# Patient Record
Sex: Female | Born: 1945
Health system: Southern US, Community
[De-identification: ages and names within clinical notes are randomized; demographics above are authoritative.]

## PROBLEM LIST (undated history)

## (undated) DIAGNOSIS — M549 Dorsalgia, unspecified: Secondary | ICD-10-CM

## (undated) DIAGNOSIS — M2550 Pain in unspecified joint: Secondary | ICD-10-CM

## (undated) DIAGNOSIS — E669 Obesity, unspecified: Secondary | ICD-10-CM

## (undated) DIAGNOSIS — R7301 Impaired fasting glucose: Secondary | ICD-10-CM

## (undated) DIAGNOSIS — E559 Vitamin D deficiency, unspecified: Secondary | ICD-10-CM

## (undated) DIAGNOSIS — R7303 Prediabetes: Secondary | ICD-10-CM

## (undated) DIAGNOSIS — I10 Essential (primary) hypertension: Secondary | ICD-10-CM

## (undated) HISTORY — DX: Obesity, unspecified: E66.9

## (undated) HISTORY — DX: Prediabetes: R73.03

## (undated) HISTORY — DX: Pain in unspecified joint: M25.50

## (undated) HISTORY — PX: OTHER SURGICAL HISTORY: SHX169

## (undated) HISTORY — DX: Dorsalgia, unspecified: M54.9

## (undated) HISTORY — DX: Vitamin D deficiency, unspecified: E55.9

## (undated) HISTORY — DX: Essential (primary) hypertension: I10

## (undated) HISTORY — DX: Impaired fasting glucose: R73.01

## (undated) HISTORY — PX: MM BREAST STEREO BX*L*R/S: HXRAD495

---

## 1995-04-10 HISTORY — PX: TOTAL ABDOMINAL HYSTERECTOMY: SHX209

## 1998-04-09 HISTORY — PX: WRIST FRACTURE SURGERY: SHX121

## 1999-03-09 ENCOUNTER — Ambulatory Visit (HOSPITAL_COMMUNITY): Admission: RE | Admit: 1999-03-09 | Discharge: 1999-03-09 | Payer: Self-pay | Admitting: Gastroenterology

## 2001-10-17 ENCOUNTER — Encounter: Payer: Self-pay | Admitting: Emergency Medicine

## 2001-10-17 ENCOUNTER — Emergency Department (HOSPITAL_COMMUNITY): Admission: EM | Admit: 2001-10-17 | Discharge: 2001-10-17 | Payer: Self-pay | Admitting: Emergency Medicine

## 2004-02-26 ENCOUNTER — Emergency Department (HOSPITAL_COMMUNITY): Admission: EM | Admit: 2004-02-26 | Discharge: 2004-02-26 | Payer: Self-pay | Admitting: *Deleted

## 2004-02-28 ENCOUNTER — Ambulatory Visit: Payer: Self-pay | Admitting: Internal Medicine

## 2004-08-17 ENCOUNTER — Ambulatory Visit (HOSPITAL_COMMUNITY): Admission: RE | Admit: 2004-08-17 | Discharge: 2004-08-17 | Payer: Self-pay | Admitting: Internal Medicine

## 2004-08-22 ENCOUNTER — Ambulatory Visit: Payer: Self-pay | Admitting: Internal Medicine

## 2004-12-04 ENCOUNTER — Ambulatory Visit: Payer: Self-pay | Admitting: Internal Medicine

## 2004-12-18 ENCOUNTER — Ambulatory Visit: Payer: Self-pay | Admitting: Internal Medicine

## 2005-03-19 ENCOUNTER — Ambulatory Visit: Payer: Self-pay | Admitting: Internal Medicine

## 2006-01-02 ENCOUNTER — Ambulatory Visit: Payer: Self-pay | Admitting: Internal Medicine

## 2006-01-23 ENCOUNTER — Ambulatory Visit: Payer: Self-pay | Admitting: Internal Medicine

## 2006-01-23 LAB — CONVERTED CEMR LAB
ALT: 24 units/L (ref 0–40)
AST: 28 units/L (ref 0–37)
Albumin: 3.6 g/dL (ref 3.5–5.2)
Alkaline Phosphatase: 69 units/L (ref 39–117)
BUN: 12 mg/dL (ref 6–23)
Basophils Absolute: 0 10*3/uL (ref 0.0–0.1)
Basophils Relative: 0.4 % (ref 0.0–1.0)
CO2: 30 meq/L (ref 19–32)
Calcium: 9.5 mg/dL (ref 8.4–10.5)
Chloride: 100 meq/L (ref 96–112)
Chol/HDL Ratio, serum: 3
Cholesterol: 203 mg/dL (ref 0–200)
Creatinine, Ser: 0.9 mg/dL (ref 0.4–1.2)
Eosinophil percent: 1 % (ref 0.0–5.0)
GFR calc non Af Amer: 68 mL/min
Glomerular Filtration Rate, Af Am: 82 mL/min/{1.73_m2}
Glucose, Bld: 96 mg/dL (ref 70–99)
HCT: 40.2 % (ref 36.0–46.0)
HDL: 68.1 mg/dL (ref >39.0)
Hemoglobin: 13.5 g/dL (ref 12.0–15.0)
LDL DIRECT: 111.2 mg/dL
Lymphocytes Relative: 44.8 % (ref 12.0–46.0)
MCHC: 33.7 g/dL (ref 30.0–36.0)
MCV: 92 fL (ref 78.0–100.0)
Monocytes Absolute: 0.4 10*3/uL (ref 0.2–0.7)
Monocytes Relative: 7.8 % (ref 3.0–11.0)
Neutro Abs: 2.6 10*3/uL (ref 1.4–7.7)
Neutrophils Relative %: 46 % (ref 43.0–77.0)
Platelets: 158 10*3/uL (ref 150–400)
Potassium: 3.2 meq/L — ABNORMAL LOW (ref 3.5–5.1)
RBC: 4.36 M/uL (ref 3.87–5.11)
RDW: 12.7 % (ref 11.5–14.6)
Sodium: 139 meq/L (ref 135–145)
TSH: 1.28 microintl units/mL (ref 0.35–5.50)
Total Bilirubin: 0.9 mg/dL (ref 0.3–1.2)
Total Protein: 7.3 g/dL (ref 6.0–8.3)
Triglyceride fasting, serum: 53 mg/dL (ref 0–149)
VLDL: 11 mg/dL (ref 0–40)
WBC: 5.7 10*3/uL (ref 4.5–10.5)

## 2006-01-30 ENCOUNTER — Ambulatory Visit: Payer: Self-pay | Admitting: Internal Medicine

## 2006-08-02 ENCOUNTER — Ambulatory Visit: Payer: Self-pay | Admitting: Internal Medicine

## 2006-08-02 LAB — CONVERTED CEMR LAB
BUN: 14 mg/dL (ref 6–23)
CO2: 33 meq/L — ABNORMAL HIGH (ref 19–32)
Calcium: 9.3 mg/dL (ref 8.4–10.5)
Chloride: 106 meq/L (ref 96–112)
Creatinine, Ser: 0.8 mg/dL (ref 0.4–1.2)
GFR calc Af Amer: 94 mL/min
GFR calc non Af Amer: 78 mL/min
Glucose, Bld: 92 mg/dL (ref 70–99)
Potassium: 5.1 meq/L (ref 3.5–5.1)
Sodium: 143 meq/L (ref 135–145)

## 2006-08-09 ENCOUNTER — Ambulatory Visit: Payer: Self-pay | Admitting: Internal Medicine

## 2007-01-24 ENCOUNTER — Ambulatory Visit: Payer: Self-pay | Admitting: Internal Medicine

## 2007-01-24 LAB — CONVERTED CEMR LAB
Bilirubin Urine: NEGATIVE
Glucose, Urine, Semiquant: NEGATIVE
Ketones, urine, test strip: NEGATIVE
Nitrite: NEGATIVE
Protein, U semiquant: NEGATIVE
Specific Gravity, Urine: 1.02
Urobilinogen, UA: 0.2
WBC Urine, dipstick: NEGATIVE
pH: 6.5

## 2007-01-28 ENCOUNTER — Telehealth: Payer: Self-pay | Admitting: Internal Medicine

## 2007-01-28 LAB — CONVERTED CEMR LAB
ALT: 21 units/L (ref 0–35)
AST: 22 units/L (ref 0–37)
Albumin: 3.8 g/dL (ref 3.5–5.2)
Alkaline Phosphatase: 65 units/L (ref 39–117)
BUN: 15 mg/dL (ref 6–23)
Basophils Absolute: 0.1 10*3/uL (ref 0.0–0.1)
Basophils Relative: 0.9 % (ref 0.0–1.0)
Bilirubin, Direct: 0.1 mg/dL (ref 0.0–0.3)
CO2: 34 meq/L — ABNORMAL HIGH (ref 19–32)
Calcium: 9.5 mg/dL (ref 8.4–10.5)
Chloride: 105 meq/L (ref 96–112)
Cholesterol: 211 mg/dL (ref 0–200)
Creatinine, Ser: 0.8 mg/dL (ref 0.4–1.2)
Direct LDL: 134.6 mg/dL
Eosinophils Absolute: 0.1 10*3/uL (ref 0.0–0.6)
Eosinophils Relative: 0.9 % (ref 0.0–5.0)
GFR calc Af Amer: 94 mL/min
GFR calc non Af Amer: 78 mL/min
Glucose, Bld: 108 mg/dL — ABNORMAL HIGH (ref 70–99)
HCT: 38.7 % (ref 36.0–46.0)
HDL: 63.3 mg/dL (ref >39.0)
Hemoglobin: 13.3 g/dL (ref 12.0–15.0)
Lymphocytes Relative: 41.4 % (ref 12.0–46.0)
MCHC: 34.3 g/dL (ref 30.0–36.0)
MCV: 93.5 fL (ref 78.0–100.0)
Monocytes Absolute: 0.5 10*3/uL (ref 0.2–0.7)
Monocytes Relative: 8.7 % (ref 3.0–11.0)
Neutro Abs: 2.6 10*3/uL (ref 1.4–7.7)
Neutrophils Relative %: 48.1 % (ref 43.0–77.0)
Platelets: 179 10*3/uL (ref 150–400)
Potassium: 4.2 meq/L (ref 3.5–5.1)
RBC: 4.14 M/uL (ref 3.87–5.11)
RDW: 12.5 % (ref 11.5–14.6)
Sodium: 143 meq/L (ref 135–145)
TSH: 1.72 microintl units/mL (ref 0.35–5.50)
Total Bilirubin: 0.7 mg/dL (ref 0.3–1.2)
Total CHOL/HDL Ratio: 3.3
Total Protein: 7.3 g/dL (ref 6.0–8.3)
Triglycerides: 70 mg/dL (ref 0–149)
VLDL: 14 mg/dL (ref 0–40)
WBC: 5.7 10*3/uL (ref 4.5–10.5)

## 2007-01-31 ENCOUNTER — Ambulatory Visit: Payer: Self-pay | Admitting: Internal Medicine

## 2007-07-11 ENCOUNTER — Ambulatory Visit: Payer: Self-pay | Admitting: Internal Medicine

## 2007-07-20 LAB — CONVERTED CEMR LAB
BUN: 13 mg/dL (ref 6–23)
CO2: 32 meq/L (ref 19–32)
Calcium: 9.3 mg/dL (ref 8.4–10.5)
Chloride: 105 meq/L (ref 96–112)
Creatinine, Ser: 0.8 mg/dL (ref 0.4–1.2)
GFR calc Af Amer: 94 mL/min
GFR calc non Af Amer: 78 mL/min
Glucose, Bld: 98 mg/dL (ref 70–99)
Hgb A1c MFr Bld: 6 % (ref 4.6–6.0)
Potassium: 4.2 meq/L (ref 3.5–5.1)
Sodium: 142 meq/L (ref 135–145)

## 2007-07-21 ENCOUNTER — Ambulatory Visit: Payer: Self-pay | Admitting: Internal Medicine

## 2007-07-31 ENCOUNTER — Telehealth: Payer: Self-pay | Admitting: *Deleted

## 2007-08-04 ENCOUNTER — Telehealth: Payer: Self-pay | Admitting: *Deleted

## 2007-10-20 ENCOUNTER — Ambulatory Visit: Payer: Self-pay | Admitting: Internal Medicine

## 2008-02-17 ENCOUNTER — Ambulatory Visit: Payer: Self-pay | Admitting: Internal Medicine

## 2008-02-17 LAB — CONVERTED CEMR LAB
AST: 23 units/L (ref 0–37)
Basophils Absolute: 0 10*3/uL (ref 0.0–0.1)
Basophils Relative: 0.6 % (ref 0.0–3.0)
Bilirubin Urine: NEGATIVE
Chloride: 102 meq/L (ref 96–112)
Cholesterol: 176 mg/dL (ref 0–200)
Creatinine, Ser: 0.8 mg/dL (ref 0.4–1.2)
Eosinophils Absolute: 0.1 10*3/uL (ref 0.0–0.7)
GFR calc non Af Amer: 78 mL/min
Glucose, Urine, Semiquant: NEGATIVE
HDL: 62.2 mg/dL (ref >39.0)
MCHC: 35 g/dL (ref 30.0–36.0)
MCV: 94.4 fL (ref 78.0–100.0)
Neutrophils Relative %: 47.6 % (ref 43.0–77.0)
Platelets: 156 10*3/uL (ref 150–400)
Potassium: 3.8 meq/L (ref 3.5–5.1)
Protein, U semiquant: NEGATIVE
RDW: 12.2 % (ref 11.5–14.6)
Sodium: 140 meq/L (ref 135–145)
TSH: 1.43 microintl units/mL (ref 0.35–5.50)
Total Bilirubin: 0.7 mg/dL (ref 0.3–1.2)
Urobilinogen, UA: 0.2
VLDL: 13 mg/dL (ref 0–40)
pH: 7

## 2008-02-24 ENCOUNTER — Ambulatory Visit: Payer: Self-pay | Admitting: Internal Medicine

## 2009-02-14 ENCOUNTER — Ambulatory Visit: Payer: Self-pay | Admitting: Internal Medicine

## 2009-02-16 LAB — CONVERTED CEMR LAB
Albumin: 3.8 g/dL (ref 3.5–5.2)
Basophils Relative: 0.7 % (ref 0.0–3.0)
CO2: 28 meq/L (ref 19–32)
Chloride: 102 meq/L (ref 96–112)
Cholesterol: 202 mg/dL — ABNORMAL HIGH (ref 0–200)
Creatinine, Ser: 0.9 mg/dL (ref 0.4–1.2)
Eosinophils Absolute: 0 10*3/uL (ref 0.0–0.7)
HDL: 70.1 mg/dL (ref >39.00)
Hemoglobin: 13.1 g/dL (ref 12.0–15.0)
Lymphs Abs: 2.3 10*3/uL (ref 0.7–4.0)
MCHC: 33.2 g/dL (ref 30.0–36.0)
MCV: 96.8 fL (ref 78.0–100.0)
Monocytes Absolute: 0.4 10*3/uL (ref 0.1–1.0)
Neutro Abs: 2.6 10*3/uL (ref 1.4–7.7)
Neutrophils Relative %: 47.7 % (ref 43.0–77.0)
RBC: 4.06 M/uL (ref 3.87–5.11)
Total Protein: 7.4 g/dL (ref 6.0–8.3)
Triglycerides: 59 mg/dL (ref 0.0–149.0)

## 2009-03-11 ENCOUNTER — Ambulatory Visit: Payer: Self-pay | Admitting: Internal Medicine

## 2009-03-14 LAB — CONVERTED CEMR LAB: OCCULT 3: NEGATIVE

## 2009-04-17 ENCOUNTER — Emergency Department (HOSPITAL_BASED_OUTPATIENT_CLINIC_OR_DEPARTMENT_OTHER): Admission: EM | Admit: 2009-04-17 | Discharge: 2009-04-17 | Payer: Self-pay | Admitting: Emergency Medicine

## 2010-02-01 ENCOUNTER — Ambulatory Visit: Payer: Self-pay | Admitting: Internal Medicine

## 2010-02-01 LAB — CONVERTED CEMR LAB
AST: 24 units/L (ref 0–37)
Albumin: 3.8 g/dL (ref 3.5–5.2)
BUN: 16 mg/dL (ref 6–23)
Basophils Absolute: 0 10*3/uL (ref 0.0–0.1)
CO2: 28 meq/L (ref 19–32)
Direct LDL: 119.6 mg/dL
Eosinophils Absolute: 0.1 10*3/uL (ref 0.0–0.7)
GFR calc non Af Amer: 90.46 mL/min (ref >60)
Glucose, Bld: 108 mg/dL — ABNORMAL HIGH (ref 70–99)
Glucose, Urine, Semiquant: NEGATIVE
HCT: 37.7 % (ref 36.0–46.0)
HDL: 73.1 mg/dL (ref >39.00)
Ketones, urine, test strip: NEGATIVE
Lymphs Abs: 2.3 10*3/uL (ref 0.7–4.0)
MCHC: 34.1 g/dL (ref 30.0–36.0)
Monocytes Absolute: 0.4 10*3/uL (ref 0.1–1.0)
Monocytes Relative: 7.4 % (ref 3.0–12.0)
Neutro Abs: 3.1 10*3/uL (ref 1.4–7.7)
Platelets: 155 10*3/uL (ref 150.0–400.0)
Potassium: 4 meq/L (ref 3.5–5.1)
RDW: 13.6 % (ref 11.5–14.6)
Specific Gravity, Urine: 1.02
TSH: 1.42 microintl units/mL (ref 0.35–5.50)
Total Bilirubin: 0.8 mg/dL (ref 0.3–1.2)
Triglycerides: 62 mg/dL (ref 0.0–149.0)
pH: 6.5

## 2010-02-08 ENCOUNTER — Ambulatory Visit: Payer: Self-pay | Admitting: Internal Medicine

## 2010-02-08 ENCOUNTER — Telehealth: Payer: Self-pay | Admitting: *Deleted

## 2010-02-08 DIAGNOSIS — R82998 Other abnormal findings in urine: Secondary | ICD-10-CM

## 2010-02-08 LAB — CONVERTED CEMR LAB
Bilirubin Urine: NEGATIVE
Glucose, Urine, Semiquant: NEGATIVE
Ketones, urine, test strip: NEGATIVE
Specific Gravity, Urine: 1.025
pH: 5.5

## 2010-02-13 ENCOUNTER — Ambulatory Visit: Payer: Self-pay | Admitting: Internal Medicine

## 2010-02-15 LAB — CONVERTED CEMR LAB
Bilirubin Urine: NEGATIVE
Nitrite: NEGATIVE
Urobilinogen, UA: 0.2 (ref 0.0–1.0)

## 2010-04-30 ENCOUNTER — Encounter: Payer: Self-pay | Admitting: Internal Medicine

## 2010-05-11 NOTE — Assessment & Plan Note (Signed)
Summary: cpx/ssc   Vital Signs:  Patient profile:   64 year old female Menstrual status:  hysterectomy Height:      68 inches Weight:      230 pounds BMI:     35.10 Pulse rate:   78 / minute BP sitting:   120 / 80  (left arm) Cuff size:   large  Vitals Entered By: Romualdo Bolk, CMA (AAMA) (February 08, 2010 10:52 AM)  Nutrition Counseling: Patient's BMI is greater than 25 and therefore counseled on weight management options. CC: CPX   History of Present Illness: Doris Harris  comes in today  for   preventive visit .  Since last visit  here  there have been no major changes in health status   Had a bladder infection .  No major injuries or ed visit s HT: controlled  nose of meds . stress at work but doing well  Preventive Care Screening  Colonoscopy:    Date:  04/09/2002    Results:  normal   Prior Values:    Mammogram:  needs one (04/09/2002)    Last Tetanus Booster:  historical (04/09/2002)   Preventive Screening-Counseling & Management  Alcohol-Tobacco     Alcohol drinks/day: 0     Smoking Status: never  Caffeine-Diet-Exercise     Caffeine use/day: 1 a week maybe     Does Patient Exercise: yes  Hep-HIV-STD-Contraception     Dental Visit-last 6 months no     Sun Exposure-Excessive: no  Safety-Violence-Falls     Seat Belt Use: yes     Firearms in the Home: no firearms in the home     Smoke Detectors: yes     Fall Risk: no      Blood Transfusions:  no.    Current Medications (verified): 1)  Lisinopril-Hydrochlorothiazide 20-25 Mg Tabs (Lisinopril-Hydrochlorothiazide) .[name].. 1 By Mouth Once Daily  Allergies (verified): 1)  ! Ibuprofen (Ibuprofen)  Past History:  Past medical, surgical, family and social histories (including risk factors) reviewed, and no changes noted (except as noted below).  Past Medical History: Reviewed history from 02/24/2008 and no changes required. Hypertension obesity childbirth g1p1   IFG x 1          LAST Mammogram: 3 years ago Pap: hyst Td: 2004 Colonscopy: over 5 years ago EKG: February 24, 2008 Dexa  no   Past Surgical History: Reviewed history from 02/24/2008 and no changes required. breast bx L Hysterectomy  total 1997 fibroid   uterine polyps fracture distal radius 2000  Past History:  Care Management: None Current Gastroenterology: Pose[facility] Rea of name  Family History: Reviewed history from 02/24/2008 and no changes required. mgm dm  Family History of CAD Female 1st degree relative <60 stent age 26 Family History Hypertension both parents 2 bro and gm  no fam hx of osteoporosis.  Family History of Asthma  Social History: Reviewed history from 02/14/2009 and no changes required. 10-14 hours a day at times slight exercise.  Never Smoked Alcohol use-no sleep  8 or more.  HH of 1     no pets.   Dental Care w/in 6 mos.:  no Sun Exposure-Excessive:  no Fall Risk:  no Blood Transfusions:  no  Review of Systems  The patient denies anorexia, fever, weight loss, vision loss, decreased hearing, hoarseness, chest pain, syncope, dyspnea on exertion, peripheral edema, prolonged cough, headaches, hemoptysis, abdominal pain, melena, hematochezia, severe indigestion/heartburn, hematuria, muscle weakness, transient blindness, difficulty walking, depression, unusual weight change, abnormal bleeding,  enlarged lymph nodes, angioedema, and breast masses.    Physical Exam  General:  Well-developed,well-nourished,in no acute distress; alert,appropriate and cooperative throughout examination Head:  normocephalic, atraumatic, and no abnormalities observed.   Eyes:  vision grossly intact, pupils equal, and pupils round. glasses  Ears:  R ear normal and L ear normal.  no external deformities.   Nose:  no external deformity, no external erythema, and no nasal discharge.   Mouth:  pharynx pink and moist.  good dentition.   Neck:  No deformities, masses, or tenderness noted. Breasts:   No mass, nodules, thickening, tenderness, bulging, retraction, inflamation, nipple discharge or skin changes noted.   Lungs:  Normal respiratory effort, chest expands symmetrically. Lungs are clear to auscultation, no crackles or wheezes.no dullness.   Heart:  Normal rate and regular rhythm. S1 and S2 normal without gallop, murmur, click, rub or other extra sounds.no lifts.   Abdomen:  Bowel sounds positive,abdomen soft and non-tender without masses, organomegaly or   noted. Msk:  no joint swelling, no joint warmth, no redness over joints, and no joint deformities.   Pulses:  pulses intact without delay   Extremities:  no clubbing cyanosis or edema  Neurologic:  Pt is A&Ox3,affect,speech,memory,attention,&motor skills appear intact. alert & oriented X3, strength normal in all extremities, gait normal, and DTRs symmetrical and normal.   Skin:  turgor normal, color normal, no ecchymoses, and no petechiae.   Cervical Nodes:  No lymphadenopathy noted Axillary Nodes:  No palpable lymphadenopathy Inguinal Nodes:  No significant adenopathy Psych:  Normal eye contact, appropriate affect. Cognition appears normal.    Impression & Recommendations:  Problem # 1:  PREVENTIVE HEALTH CARE (ICD-V70.0) Discussed nutrition,exercise,diet,healthy weight, vitamin D and calcium.   utd excep mammo  and dental   Problem # 2:  HYPERTENSION (ICD-401.9)  Her updated medication list for this problem includes:    Lisinopril-hydrochlorothiazide 20-25 Mg Tabs (Lisinopril-hydrochlorothiazide) .[name]... 1 by mouth once daily  Problem # 3:  FASTING HYPERGLYCEMIA (ICD-790.29) Assessment: Unchanged counseled   Problem # 4:  OBESITY (ICD-278.00) lower weight and exercise encouraget to [facility]   Problem # 5:  URINALYSIS, ABNORMAL (ICD-791.9) repeat today has no symptoms.  Complete Medication List: 1)  Lisinopril-hydrochlorothiazide 20-25 Mg Tabs (Lisinopril-hydrochlorothiazide) .[name].. 1 by mouth once daily  Patient  Instructions: 1)  get   mammogram  2)  check yearly  3)  call for refills  4)  Exercise and diet.   Contraindications/Deferment of Procedures/Staging:    Test/Procedure: FLU VAX    Reason for deferment: patient declined     Test/Procedure: PAP Smear    Reason for deferment: hysterectomy    Orders Added: 1)  Est. Patient 40-64 years [99396] 2)  Est. Patient Level I [81191]    Prevention & Chronic Care Immunizations   Influenza vaccine: Not documented   Influenza vaccine deferral: patient declined  (02/08/2010)    Tetanus booster: 04/09/2002: historical    Pneumococcal vaccine: Not documented    H. zoster vaccine: Not documented  Colorectal Screening   Hemoccult: Not documented    Colonoscopy: normal  (04/09/2002)   Colonoscopy action/deferral: patient declined  (02/14/2009)  Other Screening   Pap smear: Not documented   Pap smear action/deferral: hysterectomy  (02/08/2010)    Mammogram: needs one  (04/09/2002)    DXA bone density scan: Not documented   Smoking status: never  (02/08/2010)  Lipids   Total Cholesterol: 216  (02/01/2010)   LDL: 101  (02/17/2008)   LDL Direct: 119.6  (02/01/2010)  HDL: 73.10  (02/01/2010)   Triglycerides: 62.0  (02/01/2010)  Hypertension   Last Blood Pressure: 120 / 80  (02/08/2010)   Serum creatinine: 0.8  (02/01/2010)   Serum potassium 4.0  (02/01/2010)  Self-Management Support :    Hypertension self-management support: Not documented   Laboratory Results   Urine Tests    Routine Urinalysis   Color: yellow Appearance: Clear Glucose: negative   (Normal Range: Negative) Bilirubin: negative   (Normal Range: Negative) Ketone: negative   (Normal Range: Negative) Spec. Gravity: 1.025   (Normal Range: 1.003-1.035) Blood: moderate   (Normal Range: Negative) pH: 5.5   (Normal Range: 5.0-8.0) Protein: negative   (Normal Range: Negative) Urobilinogen: 0.2   (Normal Range: 0-1) Nitrite: negative   (Normal Range:  Negative) Leukocyte Esterace: negative   (Normal Range: Negative)    Comments: Romualdo Bolk, CMA (AAMA)  February 08, 2010 12:13 PM

## 2010-05-11 NOTE — Progress Notes (Signed)
Summary: LMTOCB  Phone Note Outgoing Call   Call placed by: Romualdo Bolk, CMA Duncan Dull),  February 08, 2010 2:58 PM Call placed to: Patient Summary of Call: Left message for pt to call back. Pt needs to schedule a lab appt at [facility] for ua with micro because she still has blood in her urine. Initial call taken by: Romualdo Bolk, CMA (AAMA),  February 08, 2010 3:00 PM  Follow-up for Phone Call        Pt aware and will go over there on monday to get ua with micro done. Follow-up by: Romualdo Bolk, CMA (AAMA),  February 09, 2010 8:30 AM

## 2010-05-20 ENCOUNTER — Other Ambulatory Visit: Payer: Self-pay | Admitting: Internal Medicine

## 2010-06-25 LAB — URINALYSIS, ROUTINE W REFLEX MICROSCOPIC
Ketones, ur: NEGATIVE mg/dL
Nitrite: NEGATIVE
Specific Gravity, Urine: 1.024 (ref 1.005–1.030)
pH: 7.5 (ref 5.0–8.0)

## 2010-06-25 LAB — URINE MICROSCOPIC-ADD ON

## 2011-01-24 ENCOUNTER — Telehealth: Payer: Self-pay | Admitting: *Deleted

## 2011-01-24 MED ORDER — LISINOPRIL-HYDROCHLOROTHIAZIDE 20-25 MG PO TABS: 1.0000 | ORAL_TABLET | Freq: Every day | ORAL | Status: DC

## 2011-01-24 NOTE — Telephone Encounter (Signed)
Rx sent to pharmacy   

## 2011-04-10 ENCOUNTER — Other Ambulatory Visit: Payer: Self-pay | Admitting: Internal Medicine

## 2011-07-09 ENCOUNTER — Other Ambulatory Visit: Payer: Self-pay | Admitting: Internal Medicine

## 2011-07-11 NOTE — Telephone Encounter (Signed)
Please contact patient directly and see why she hasnt come in as she is due for appt and labs.  If she is getting care elsewhere etc.  We can refill one month as long as she is on schedule soon.

## 2011-07-11 NOTE — Telephone Encounter (Signed)
Pt last seen 02/08/10.  On last refill advised that pt make an appt.  No upcoming appts scheduled.  Rx last filled on 04/11/11. Pls advise.

## 2011-07-12 NOTE — Telephone Encounter (Signed)
Left a message for pt to return call 

## 2011-07-13 NOTE — Telephone Encounter (Addendum)
Pt returned call and has schd ov for 07/17/11 at 8:15. Pt will come in fasting. Pt said that she has just been really busy and that is why she hasn't scheduled an ov before now.

## 2011-07-13 NOTE — Telephone Encounter (Signed)
Rx sent to pharmacy   

## 2011-07-17 ENCOUNTER — Encounter: Payer: Self-pay | Admitting: Internal Medicine

## 2011-07-17 ENCOUNTER — Ambulatory Visit (INDEPENDENT_AMBULATORY_CARE_PROVIDER_SITE_OTHER): Payer: 59 | Admitting: Internal Medicine

## 2011-07-17 VITALS — BP 114/80 | HR 82 | Temp 98.7°F | Wt 224.0 lb

## 2011-07-17 DIAGNOSIS — E669 Obesity, unspecified: Secondary | ICD-10-CM

## 2011-07-17 DIAGNOSIS — I10 Essential (primary) hypertension: Secondary | ICD-10-CM

## 2011-07-17 DIAGNOSIS — R7309 Other abnormal glucose: Secondary | ICD-10-CM

## 2011-07-17 LAB — LIPID PANEL
Cholesterol: 205 mg/dL — ABNORMAL HIGH (ref 0–200)
Total CHOL/HDL Ratio: 3
Triglycerides: 82 mg/dL (ref 0.0–149.0)
VLDL: 16.4 mg/dL (ref 0.0–40.0)

## 2011-07-17 LAB — CBC WITH DIFFERENTIAL/PLATELET
Basophils Absolute: 0.1 10*3/uL (ref 0.0–0.1)
Eosinophils Relative: 1.7 % (ref 0.0–5.0)
HCT: 37.5 % (ref 36.0–46.0)
Lymphs Abs: 1.9 10*3/uL (ref 0.7–4.0)
MCV: 93.7 fl (ref 78.0–100.0)
Monocytes Absolute: 0.4 10*3/uL (ref 0.1–1.0)
Monocytes Relative: 7.5 % (ref 3.0–12.0)
Neutrophils Relative %: 52.9 % (ref 43.0–77.0)
Platelets: 156 10*3/uL (ref 150.0–400.0)
RDW: 13.6 % (ref 11.5–14.6)
WBC: 5.3 10*3/uL (ref 4.5–10.5)

## 2011-07-17 LAB — BASIC METABOLIC PANEL
CO2: 29 mEq/L (ref 19–32)
Calcium: 9 mg/dL (ref 8.4–10.5)
Chloride: 102 mEq/L (ref 96–112)
Glucose, Bld: 100 mg/dL — ABNORMAL HIGH (ref 70–99)
Sodium: 140 mEq/L (ref 135–145)

## 2011-07-17 LAB — HEPATIC FUNCTION PANEL
ALT: 20 U/L (ref 0–35)
Bilirubin, Direct: 0 mg/dL (ref 0.0–0.3)
Total Bilirubin: 0.5 mg/dL (ref 0.3–1.2)

## 2011-07-17 LAB — TSH: TSH: 1.17 u[IU]/mL (ref 0.35–5.50)

## 2011-07-17 MED ORDER — LISINOPRIL-HYDROCHLOROTHIAZIDE 20-25 MG PO TABS: 1.0000 | ORAL_TABLET | Freq: Every day | ORAL | Status: DC

## 2011-07-17 NOTE — Progress Notes (Signed)
Subjective:    Patient ID: Doris Harris, female    DOB: [birth year], 66 y.o.   MRN: [id number]  HPI Patient comes in today for follow up of  multiple medical problems.  Last visit was fall of 2011 . Is overdue for lab and check . No major changes ; ,injury surgery or hospitalizations.  BP  Good readings at home 110 120 or less.  No injury or illness otherwise sleep irreg   Cause of long hours at times   Past Medical History:  Hypertension  obesity  childbirth g1p1  IFG x 1  LAST  Pap: hyst  Td: 2004  Colonscopy: over 5 years ago  2004  EKG: February 24, 2008  Dexa no  Past Surgical History:  Reviewed history from 11  2011  and no changes required.  breast bx L  Hysterectomy total 1997 fibroid  uterine polyps  fracture distal radius 2000  Past History:  Care Management:  None Current  Gastroenterology: Posey Rea of name  Family History:  Reviewed history from 11/17/2011and no changes required.  mgm dm  Family History of CAD Female 1st degree relative <60 stent age 59  Family History Hypertension both parents 2 bro and gm  no fam hx of osteoporosis.  Family History of Asthma     Review of Systems ROS:  GEN/ HEENT: No fever, significant weight changes sweats headaches vision problems hearing changes, CV/ PULM; No chest pain shortness of breath cough, syncope,edema  change in exercise tolerance. GI /GU: No adominal pain, vomiting, change in bowel habits. No blood in the stool. No significant GU symptoms. SKIN/HEME: ,no acute skin rashes suspicious lesions or bleeding. No lymphadenopathy, nodules, masses.  NEURO/ PSYCH:  No neurologic signs such as weakness numbness. No depression anxiety. IMM/ Allergy: No unusual infections.  Allergy .   REST of 12 system review negative except as per HPI     Objective:   Physical Exam  Wt Readings from Last 3 Encounters:  07/17/11 224 lb (101.606 kg)  02/08/10 230 lb (104.327 kg)  02/14/09 232 lb (105.235 kg)  Physical  Exam: Vital signs reviewed WUJ:WJXB is a well-developed well-nourished alert cooperative aa female who appears her stated age in no acute distress.  HEENT: normocephalic atraumatic , Eyes: PERRL EOM's full, conjunctiva clear, Nares: paten,t no deformity discharge or tenderness., Ears: no deformity EAC's clear TMs with normal landmarks. Wax in right eac Mouth: clear OP, no lesions, edema.  Moist mucous membranes. Dentition in adequate repair. NECK: supple without masses, thyromegaly or bruits. CHEST/PULM:  Clear to auscultation and percussion breath sounds equal no wheeze , rales or rhonchi. No chest wall deformities or tenderness. CV: PMI is nondisplaced, S1 S2 no gallops, murmurs, rubs. Peripheral pulses are full without delay.No JVD .  ABDOMEN: Bowel sounds normal nontender  No guard or rebound, no hepato splenomegal no CVA tenderness.   Extremtities:  No clubbing cyanosis or edema, no acute joint swelling or redness no focal atrophy NEURO:  Oriented x3, cranial nerves 3-12 appear to be intact, no obvious focal weakness,gait within normal limits  SKIN: No acute rashes normal turgor, color, no bruising or petechiae. PSYCH: Oriented, good eye contact, no obvious depression anxiety, cognition and judgment appear normal. LN: no cervical adenopathy       Assessment & Plan:    hypertension Continue need labs  Intensify lifestyle interventions.   Obesity some weigh tloss to continue  Hx of hyperglycemia  Check today  Intensify lifestyle interventions.  HCM  Get mammogram last colon 20004 utd then   Total visit > 50% spent counseling and coordinating care

## 2011-07-17 NOTE — Patient Instructions (Signed)
Get a mammogram   due for colonoscopy next year . Will notify you  of labs when available. Continue lifestyle intervention healthy eating and exercise . Weight loss will help .   Exercise to Stay Healthy Exercise helps you become and stay healthy. EXERCISE IDEAS AND TIPS Choose exercises that:  You enjoy.   Fit into your day.  You do not need to exercise really hard to be healthy. You can do exercises at a slow or medium level and stay healthy. You can:  Stretch before and after working out.   Try yoga, Pilates, or tai chi.   Lift weights.   Walk fast, swim, jog, run, climb stairs, bicycle, dance, or rollerskate.   Take aerobic classes.  Exercises that burn about 150 calories:  Running 1  miles in 15 minutes.   Playing volleyball for 45 to 60 minutes.   Washing and waxing a car for 45 to 60 minutes.   Playing touch football for 45 minutes.   Walking 1  miles in 35 minutes.   Pushing a stroller 1  miles in 30 minutes.   Playing basketball for 30 minutes.   Raking leaves for 30 minutes.   Bicycling 5 miles in 30 minutes.   Walking 2 miles in 30 minutes.   Dancing for 30 minutes.   Shoveling snow for 15 minutes.   Swimming laps for 20 minutes.   Walking up stairs for 15 minutes.   Bicycling 4 miles in 15 minutes.   Gardening for 30 to 45 minutes.   Jumping rope for 15 minutes.   Washing windows or floors for 45 to 60 minutes.  Document Released: 04/28/2010 Document Revised: 03/15/2011 Document Reviewed: 04/28/2010 [facility] Patient Information 2012 [place], [place].

## 2011-07-24 ENCOUNTER — Other Ambulatory Visit: Payer: Self-pay | Admitting: *Deleted

## 2011-07-24 MED ORDER — POTASSIUM CHLORIDE ER 10 MEQ PO TBCR: 10.0000 meq | EXTENDED_RELEASE_TABLET | Freq: Two times a day (BID) | ORAL | Status: DC

## 2011-08-14 ENCOUNTER — Other Ambulatory Visit (INDEPENDENT_AMBULATORY_CARE_PROVIDER_SITE_OTHER): Payer: 59

## 2011-08-14 DIAGNOSIS — I10 Essential (primary) hypertension: Secondary | ICD-10-CM

## 2011-08-14 DIAGNOSIS — E876 Hypokalemia: Secondary | ICD-10-CM

## 2011-08-14 LAB — BASIC METABOLIC PANEL
BUN: 12 mg/dL (ref 6–23)
CO2: 28 mEq/L (ref 19–32)
Calcium: 9 mg/dL (ref 8.4–10.5)
Creatinine, Ser: 0.9 mg/dL (ref 0.4–1.2)
GFR: 82.98 mL/min (ref >60.00)
Glucose, Bld: 99 mg/dL (ref 70–99)

## 2011-08-22 NOTE — Progress Notes (Signed)
Quick Note:  Attempt to call- VM - LMTCB if any problems or questions - gave Dr. Rosezella [place] instructions and I will send a copy to home address with instructions listed. KIK ______

## 2012-09-05 ENCOUNTER — Ambulatory Visit (INDEPENDENT_AMBULATORY_CARE_PROVIDER_SITE_OTHER): Payer: Medicare Other | Admitting: Internal Medicine

## 2012-09-05 ENCOUNTER — Encounter: Payer: Self-pay | Admitting: Internal Medicine

## 2012-09-05 VITALS — BP 126/94 | HR 70 | Temp 98.1°F | Wt 236.0 lb

## 2012-09-05 DIAGNOSIS — I10 Essential (primary) hypertension: Secondary | ICD-10-CM

## 2012-09-05 DIAGNOSIS — E669 Obesity, unspecified: Secondary | ICD-10-CM

## 2012-09-05 DIAGNOSIS — R7309 Other abnormal glucose: Secondary | ICD-10-CM

## 2012-09-05 LAB — CBC WITH DIFFERENTIAL/PLATELET
Basophils Relative: 0.8 % (ref 0.0–3.0)
Eosinophils Absolute: 0.1 10*3/uL (ref 0.0–0.7)
Lymphocytes Relative: 38.7 % (ref 12.0–46.0)
MCHC: 33.7 g/dL (ref 30.0–36.0)
Neutrophils Relative %: 49.7 % (ref 43.0–77.0)
Platelets: 181 10*3/uL (ref 150.0–400.0)
RBC: 4.34 Mil/uL (ref 3.87–5.11)
WBC: 5.6 10*3/uL (ref 4.5–10.5)

## 2012-09-05 LAB — LIPID PANEL
HDL: 71.1 mg/dL (ref >39.00)
Total CHOL/HDL Ratio: 3
Triglycerides: 53 mg/dL (ref 0.0–149.0)
VLDL: 10.6 mg/dL (ref 0.0–40.0)

## 2012-09-05 LAB — HEPATIC FUNCTION PANEL
ALT: 19 U/L (ref 0–35)
Bilirubin, Direct: 0.1 mg/dL (ref 0.0–0.3)
Total Bilirubin: 0.6 mg/dL (ref 0.3–1.2)

## 2012-09-05 LAB — BASIC METABOLIC PANEL
CO2: 30 mEq/L (ref 19–32)
Calcium: 9.5 mg/dL (ref 8.4–10.5)
Creatinine, Ser: 0.9 mg/dL (ref 0.4–1.2)

## 2012-09-05 LAB — TSH: TSH: 0.99 u[IU]/mL (ref 0.35–5.50)

## 2012-09-05 LAB — HEMOGLOBIN A1C: Hgb A1c MFr Bld: 5.9 % (ref 4.6–6.5)

## 2012-09-05 MED ORDER — LISINOPRIL-HYDROCHLOROTHIAZIDE 20-25 MG PO TABS: 1.0000 | ORAL_TABLET | Freq: Every day | ORAL | Status: DC

## 2012-09-05 NOTE — Progress Notes (Signed)
Chief Complaint  Patient presents with  . Follow-up    HPI:  Patient comes in for followup of medication and blood pressure disease management. Last OV was 4 13  No major change in health status since last visit . Since then she has retired in December is trying to go to the gym most days but is having some family issues with son.   HT:  normmally in 70s or lower.   Taking meds every day with no obvious side effects meds taking   Exercising doing the Y .  ROS: See pertinent positives and negatives per HPI. Negative for chest pain shortness of breath major changes in hearing vision syncope unusual bleeding. Thinks she is up-to-date on her colonoscopy.  Past Medical History  Diagnosis Date  . Hypertension   . Obesity   . IFG (impaired fasting glucose)     x1   Past Surgical History  Procedure Laterality Date  . Mm breast stereo bx*l*r/s    . Total abdominal hysterectomy  1997    fibroid  . Uterine polyps    . Wrist fracture surgery  2000     Family History  Problem Relation Age of Onset  . Hypertension Mother   . Hypertension Father   . Hypertension Brother   . Diabetes Maternal Grandmother   . Coronary artery disease Other   . Hypertension Other   . Asthma Other   . Hypertension Brother     History   Social History  . Marital Status: Single    Spouse Name: N/A    Number of Children: N/A  . Years of Education: N/A   Social History Main Topics  . Smoking status: Never Smoker   . Smokeless tobacco: Never Used  . Alcohol Use: No  . Drug Use: None  . Sexually Active: None   Other Topics Concern  . None   Social History Narrative   Retired 12 13    Exercise  Neg tad    Never smoker   Sleep 8 or more hours   hh of 1   No pets     Outpatient Encounter Prescriptions as of 09/05/2012  Medication Sig Dispense Refill  . lisinopril-hydrochlorothiazide (PRINZIDE,ZESTORETIC) 20-25 MG per tablet Take 1 tablet by mouth daily.  90 tablet  3  . [DISCONTINUED]  lisinopril-hydrochlorothiazide (PRINZIDE,ZESTORETIC) 20-25 MG per tablet Take 1 tablet by mouth daily.  90 tablet  3  . [DISCONTINUED] potassium chloride (K-DUR) 10 MEQ tablet Take 1 tablet (10 mEq total) by mouth 2 (two) times daily.  60 tablet  3   No facility-administered encounter medications on file as of 09/05/2012.    EXAM:  BP 126/94  Pulse 70  Temp(Src) 98.1 F (36.7 C) (Oral)  Wt 236 lb (107.049 kg)  BMI 35.89 kg/m2  SpO2 97%  Body mass index is 35.89 kg/(m^2).  GENERAL: vitals reviewed and listed above, alert, oriented, appears well hydrated and in no acute distress  HEENT: atraumatic, conjunctiva  clear, no obvious abnormalities on inspection of external nose and ears  NECK: no obvious masses on inspection palpation  / thyroid palpable  No bruit or masses   LUNGS: clear to auscultation bilaterally, no wheezes, rales or rhonchi, good air movement  CV: HRRR, no clubbing cyanosis or  peripheral edema nl cap refill  Abdomen:  Sof,t normal bowel sounds without hepatosplenomegaly, no guarding rebound or masses no CVA tenderness MS: moves all extremities without noticeable focal  abnormality PSYCH: pleasant and cooperative, no obvious depression  or anxiety Lab Results  Component Value Date   WBC 5.3 07/17/2011   HGB 12.6 07/17/2011   HCT 37.5 07/17/2011   PLT 156.0 07/17/2011   GLUCOSE 99 08/14/2011   CHOL 205* 07/17/2011   TRIG 82.0 07/17/2011   HDL 75.20 07/17/2011   LDLDIRECT 110.8 07/17/2011   LDLCALC 101* 02/17/2008   ALT 20 07/17/2011   AST 22 07/17/2011   NA 139 08/14/2011   K 4.0 08/14/2011   CL 101 08/14/2011   CREATININE 0.9 08/14/2011   BUN 12 08/14/2011   CO2 28 08/14/2011   TSH 1.17 07/17/2011   HGBA1C 6.1 07/17/2011   Wt Readings from Last 3 Encounters:  09/05/12 236 lb (107.049 kg)  07/17/11 224 lb (101.606 kg)  02/08/10 230 lb (104.327 kg)     ASSESSMENT AND PLAN:  Discussed the following assessment and plan:  HYPERTENSION - has been controlled;  check metabolic  parameters and ensure control at home readings - Plan: Basic metabolic panel, CBC with Differential, Hemoglobin A1c, Hepatic function panel, Lipid panel, TSH  FASTING HYPERGLYCEMIA - check a1c  - Plan: Basic metabolic panel, CBC with Differential, Hemoglobin A1c, Hepatic function panel, Lipid panel, TSH  OBESITY - continue lsi  Due for labs   Up date HCM patient ;declines the Pneumovax today states she is up-to-date otherwise Can come back yearly if blood pressure is good and labs are well reviewed healthcare maintenance recommendations -Patient advised to return or notify health care team  if symptoms worsen or persist or new concerns arise.  Patient Instructions  Check blood pressure readings  When  You are out of office and make sure  At [facility]. Will notify you  of labs when available.  Will send in refills of medication   Get a mammogram   pneumovax when you wish .  Continue lifestyle intervention healthy eating and exercise .     Neta Mends. Panosh M.D.  Health Maintenance  Topic Date Due  . Zostavax  10/13/2006  . Mammogram  01/30/2009  . Pneumococcal Polysaccharide Vaccine Age 87 And Over  10/13/2011  . Colonoscopy  04/09/2012  . Tetanus/tdap  04/09/2012  . Influenza Vaccine  12/08/2012   Health Maintenance Review with patient today

## 2012-09-05 NOTE — Patient Instructions (Signed)
Check blood pressure readings  When  You are out of office and make sure  At [facility]. Will notify you  of labs when available.  Will send in refills of medication   Get a mammogram   pneumovax when you wish .  Continue lifestyle intervention healthy eating and exercise .

## 2012-09-10 ENCOUNTER — Encounter: Payer: Self-pay | Admitting: Family Medicine

## 2012-11-12 ENCOUNTER — Other Ambulatory Visit: Payer: Self-pay

## 2013-02-12 ENCOUNTER — Other Ambulatory Visit: Payer: Self-pay

## 2013-09-01 ENCOUNTER — Ambulatory Visit (INDEPENDENT_AMBULATORY_CARE_PROVIDER_SITE_OTHER): Payer: Medicare Other | Admitting: Internal Medicine

## 2013-09-01 ENCOUNTER — Encounter: Payer: Self-pay | Admitting: Internal Medicine

## 2013-09-01 VITALS — BP 138/86 | Temp 98.4°F | Ht 68.0 in | Wt 239.0 lb

## 2013-09-01 DIAGNOSIS — R7309 Other abnormal glucose: Secondary | ICD-10-CM

## 2013-09-01 DIAGNOSIS — Z7189 Other specified counseling: Secondary | ICD-10-CM

## 2013-09-01 DIAGNOSIS — Z1211 Encounter for screening for malignant neoplasm of colon: Secondary | ICD-10-CM

## 2013-09-01 DIAGNOSIS — Z23 Encounter for immunization: Secondary | ICD-10-CM

## 2013-09-01 DIAGNOSIS — I10 Essential (primary) hypertension: Secondary | ICD-10-CM

## 2013-09-01 LAB — BASIC METABOLIC PANEL
BUN: 14 mg/dL (ref 6–23)
CHLORIDE: 102 meq/L (ref 96–112)
CO2: 29 meq/L (ref 19–32)
Calcium: 9.4 mg/dL (ref 8.4–10.5)
Creatinine, Ser: 0.9 mg/dL (ref 0.4–1.2)
GFR: 83.55 mL/min (ref >60.00)
GLUCOSE: 105 mg/dL — AB (ref 70–99)
POTASSIUM: 3.3 meq/L — AB (ref 3.5–5.1)
SODIUM: 139 meq/L (ref 135–145)

## 2013-09-01 LAB — HEPATIC FUNCTION PANEL
ALK PHOS: 64 U/L (ref 39–117)
ALT: 16 U/L (ref 0–35)
AST: 22 U/L (ref 0–37)
Albumin: 3.7 g/dL (ref 3.5–5.2)
BILIRUBIN DIRECT: 0.1 mg/dL (ref 0.0–0.3)
TOTAL PROTEIN: 7.4 g/dL (ref 6.0–8.3)
Total Bilirubin: 0.8 mg/dL (ref 0.2–1.2)

## 2013-09-01 LAB — LIPID PANEL
CHOL/HDL RATIO: 3
Cholesterol: 225 mg/dL — ABNORMAL HIGH (ref 0–200)
HDL: 69.4 mg/dL (ref >39.00)
LDL Cholesterol: 140 mg/dL — ABNORMAL HIGH (ref 0–99)
NONHDL: 155.6
Triglycerides: 79 mg/dL (ref 0.0–149.0)
VLDL: 15.8 mg/dL (ref 0.0–40.0)

## 2013-09-01 LAB — TSH: TSH: 1.21 u[IU]/mL (ref 0.35–4.50)

## 2013-09-01 LAB — HEMOGLOBIN A1C: Hgb A1c MFr Bld: 6.1 % (ref 4.6–6.5)

## 2013-09-01 MED ORDER — LISINOPRIL-HYDROCHLOROTHIAZIDE 20-25 MG PO TABS: 1.0000 | ORAL_TABLET | Freq: Every day | ORAL | Status: DC

## 2013-09-01 NOTE — Patient Instructions (Signed)
Continue lifestyle intervention healthy eating and exercise . 150 minutes of exercise weeks  ,  Lose weight  To healthy levels. Avoid trans fats and processed foods;  Increase fresh fruits and veges to 5 servings per day. And avoid sweet beverages  Including tea and juice.  Will notify you  of labs when available. ROV in 1 year or as needed Advise prevnar 13 vaccine for reasons explained .  Get a mammogram.

## 2013-09-01 NOTE — Progress Notes (Signed)
Chief Complaint  Patient presents with  . Follow-up    Meds  . Hypertension    HPI:  Yearly eavaluation for ht etc  No major change in health status since last visit .  Bp : taking med no se of meds  todays in office is u[p has been better than that.   About 128/134/     70-80s exercise going tpo Y  On reg basis no weight loss but feels well about this  ROS: See pertinent positives and negatives per HPI. ROS:  GEN/ HEENT: No fever, significant weight changes sweats headaches vision problems hearing changes, CV/ PULM; No chest pain shortness of breath cough, syncope,edema  change in exercise tolerance. GI /GU: No adominal pain, vomiting, change in bowel habits. No blood in the stool. No significant GU symptoms. SKIN/HEME: ,no acute skin rashes suspicious lesions or bleeding. No lymphadenopathy, nodules, masses.  NEURO/ PSYCH:  No neurologic signs such as weakness  Can get numb hands when sleeping at night seems positional . No depression anxiety. IMM/ Allergy: No unusual infections.  Allergy .   REST of 12 system review negative except as per HPI   Past Medical History  Diagnosis Date  . Hypertension   . Obesity   . IFG (impaired fasting glucose)     x1    Family History  Problem Relation Age of Onset  . Hypertension Mother   . Hypertension Father   . Hypertension Brother   . Diabetes Maternal Grandmother   . Coronary artery disease Other   . Hypertension Other   . Asthma Other   . Hypertension Brother     History   Social History  . Marital Status: Single    Spouse Name: N/A    Number of Children: N/A  . Years of Education: N/A   Social History Main Topics  . Smoking status: Never Smoker   . Smokeless tobacco: Never Used  . Alcohol Use: No  . Drug Use: None  . Sexual Activity: None   Other Topics Concern  . None   Social History Narrative   Retired 12 13    Exercise  Neg tad    Never smoker   Sleep 8 or more hours   hh of 1   No pets            Outpatient Encounter Prescriptions as of 09/01/2013  Medication Sig  . lisinopril-hydrochlorothiazide (PRINZIDE,ZESTORETIC) 20-25 MG per tablet Take 1 tablet by mouth daily.  . [DISCONTINUED] lisinopril-hydrochlorothiazide (PRINZIDE,ZESTORETIC) 20-25 MG per tablet Take 1 tablet by mouth daily.    EXAM:  BP 138/86  Temp(Src) 98.4 F (36.9 C) (Oral)  Ht 5\' 8"  (1.727 m)  Wt 239 lb (108.41 kg)  BMI 36.35 kg/m2  Body mass index is 36.35 kg/(m^2).  GENERAL: vitals reviewed and listed above, alert, oriented, appears well hydrated and in no acute distress HEENT: atraumatic, conjunctiva  clear, no obvious abnormalities on inspection of external nose and earsNECK: no obvious masses on inspection palpation  LUNGS: clear to auscultation bilaterally, no wheezes, rales or rhonchi, good air movement CV: HRRR, no clubbing cyanosis or  peripheral edema nl cap refill  MS: moves all extremities without noticeable focal  abnormality PSYCH: pleasant and cooperative, no obvious depression or anxiety   ASSESSMENT AND PLAN:  Discussed the following assessment and plan:  HYPERTENSION - adequate control cont meds lsi weoghtloss - Plan: Basic metabolic panel, Hepatic function panel, Lipid panel, TSH, Hemoglobin A1c  Other abnormal glucose -  monitor  - Plan: Basic metabolic panel, Hepatic function panel, Lipid panel, TSH, Hemoglobin A1c  Need for tetanus booster - Plan: Td vaccine greater than or equal to 7yo preservative free IM  Screening for colon cancer - declines colonscopy do ifob yearly  - Plan: Fecal occult blood, imunochemical  Counseling on health promotion and disease prevention - declines colon will get mammogram . advise prevnar 13  pt will think about it.   -Patient advised to return or notify health care team  if symptoms worsen ,persist or new concerns arise.  Patient Instructions  Continue lifestyle intervention healthy eating and exercise . 150 minutes of exercise weeks  ,  Lose  weight  To healthy levels. Avoid trans fats and processed foods;  Increase fresh fruits and veges to 5 servings per day. And avoid sweet beverages  Including tea and juice.  Will notify you  of labs when available. ROV in 1 year or as needed Advise prevnar 13 vaccine for reasons explained .  Get a mammogram.      Neta Mends. Panosh M.D. Total visit > 50% spent counseling and coordinating care      Pre visit review using our clinic review tool, if applicable. No additional management support is needed unless otherwise documented below in the visit note.

## 2013-09-02 ENCOUNTER — Telehealth: Payer: Self-pay | Admitting: Internal Medicine

## 2013-09-02 NOTE — Telephone Encounter (Signed)
Relevant patient education mailed to patient.  

## 2013-09-10 ENCOUNTER — Other Ambulatory Visit: Payer: Medicare Other

## 2013-09-11 ENCOUNTER — Other Ambulatory Visit (INDEPENDENT_AMBULATORY_CARE_PROVIDER_SITE_OTHER): Payer: Medicare Other

## 2013-09-11 ENCOUNTER — Other Ambulatory Visit: Payer: Self-pay | Admitting: Family Medicine

## 2013-09-11 DIAGNOSIS — Z1211 Encounter for screening for malignant neoplasm of colon: Secondary | ICD-10-CM

## 2013-09-11 LAB — FECAL OCCULT BLOOD, IMMUNOCHEMICAL: Fecal Occult Bld: NEGATIVE

## 2013-09-11 NOTE — Progress Notes (Signed)
Quick Note:  Inform patient stool test negative for blood . Routine follow. ______ 

## 2013-09-16 ENCOUNTER — Encounter: Payer: Self-pay | Admitting: Family Medicine

## 2013-09-29 ENCOUNTER — Other Ambulatory Visit: Payer: Self-pay | Admitting: Family Medicine

## 2013-09-29 ENCOUNTER — Encounter: Payer: Self-pay | Admitting: Family Medicine

## 2013-09-29 MED ORDER — POTASSIUM CHLORIDE ER 10 MEQ PO TBCR: 20.0000 meq | EXTENDED_RELEASE_TABLET | Freq: Every day | ORAL | Status: DC

## 2013-11-16 ENCOUNTER — Telehealth: Payer: Self-pay | Admitting: Internal Medicine

## 2013-11-16 NOTE — Telephone Encounter (Signed)
Patient Information:  Caller Name: Manson AllanVelma  Phone: [phone]  Patient: Doris Harris, Doris Harris  Gender: Female  DOB: 04/09/1945  Age: 68 Years  PCP: Berniece AndreasPanosh, Wanda (Family Practice)  Office Follow Up:  Does the office need to follow up with this patient?: No  Instructions For The Office: N/A   Symptoms  Reason For Call & Symptoms: Pt is calling and states that she has been on Lisinopril for years and she is now coughing; pt would like to have this medication changed due to the cough; cough started approx 1 month ago  Reviewed Health History In EMR: Yes  Reviewed Medications In EMR: Yes  Reviewed Allergies In EMR: Yes  Reviewed Surgeries / Procedures: Yes  Date of Onset of Symptoms: 10/16/2013  Guideline(s) Used:  Cough  Disposition Per Guideline:   See Within 3 Days in Office  Reason For Disposition Reached:   Taking an ACE Inhibitor medication (Harris.g., benazepril/LOTENSIN, captopril/CAPOTEN, enalapril/VASOTEC, lisinopril/ZESTRIL)  Advice Given:  Call Back If:  You become worse.  Patient Will Follow Care Advice:  YES  Appointment Scheduled:  11/17/2013 08:45:00 Appointment Scheduled Provider: Donell Beersucker, Matthew (only sees ages 2415 and up)

## 2013-11-16 NOTE — Telephone Encounter (Signed)
Pt has appt with Donell BeersMatthew Tucker, PAC on 11/17/13 @ 8:45AM

## 2013-11-17 ENCOUNTER — Encounter: Payer: Self-pay | Admitting: Physician Assistant

## 2013-11-17 ENCOUNTER — Ambulatory Visit (INDEPENDENT_AMBULATORY_CARE_PROVIDER_SITE_OTHER): Payer: Medicare Other | Admitting: Physician Assistant

## 2013-11-17 ENCOUNTER — Ambulatory Visit: Payer: Self-pay | Admitting: Physician Assistant

## 2013-11-17 VITALS — BP 118/84 | HR 72 | Temp 99.0°F | Resp 18 | Wt 234.0 lb

## 2013-11-17 DIAGNOSIS — R059 Cough, unspecified: Secondary | ICD-10-CM

## 2013-11-17 DIAGNOSIS — R05 Cough: Secondary | ICD-10-CM

## 2013-11-17 DIAGNOSIS — L659 Nonscarring hair loss, unspecified: Secondary | ICD-10-CM

## 2013-11-17 MED ORDER — LOSARTAN POTASSIUM-HCTZ 100-25 MG PO TABS: 1.0000 | ORAL_TABLET | Freq: Every day | ORAL | Status: DC

## 2013-11-17 NOTE — Progress Notes (Signed)
Subjective:    Patient ID: Doris Harris, female    DOB: Sep 04, 1945, 69 y.o.   MRN: [id number]  Cough This is a new problem. The current episode started 1 to 4 weeks ago (about month). The problem has been waxing and waning. The problem occurs every few hours (maybe 3 to 4 times per day). The cough is non-productive. Pertinent negatives include no chest pain, chills, ear congestion, ear pain, fever, headaches, heartburn, hemoptysis, myalgias, nasal congestion, postnasal drip, rash, rhinorrhea, sore throat, shortness of breath, sweats, weight loss or wheezing. Nothing aggravates the symptoms. She has tried nothing for the symptoms. There is no history of asthma, COPD or environmental allergies.      Review of Systems  Constitutional: Negative for fever, chills, weight loss, diaphoresis, activity change, appetite change, fatigue and unexpected weight change.  HENT: Negative for ear pain, postnasal drip, rhinorrhea and sore throat.   Respiratory: Positive for cough (for about 1 months, thinks this may be from ACE-I.). Negative for hemoptysis, shortness of breath and wheezing.   Cardiovascular: Negative for chest pain.  Gastrointestinal: Negative for heartburn, nausea, vomiting and diarrhea.       Denies Dysphagia and Heartburn.  Musculoskeletal: Negative for myalgias.  Skin: Negative for rash.       Pt has also noticed some hair loss/breakage, which she also read can be caused by ACE-I.  Allergic/Immunologic: Negative for environmental allergies.  Neurological: Negative for headaches.  All other systems reviewed and are negative.    Past Medical History  Diagnosis Date  . Hypertension   . Obesity   . IFG (impaired fasting glucose)     x1    History   Social History  . Marital Status: Single    Spouse Name: N/A    Number of Children: N/A  . Years of Education: N/A   Occupational History  . Not on file.   Social History Main Topics  . Smoking status: Never Smoker   .  Smokeless tobacco: Never Used  . Alcohol Use: No  . Drug Use: Not on file  . Sexual Activity: Not on file   Other Topics Concern  . Not on file   Social History Narrative   Retired 12 13    Exercise  Neg tad    Never smoker   Sleep 8 or more hours   hh of 1   No pets           Past Surgical History  Procedure Laterality Date  . Mm breast stereo bx*l*r/s    . Total abdominal hysterectomy  1997    fibroid  . Uterine polyps    . Wrist fracture surgery  2000    Family History  Problem Relation Age of Onset  . Hypertension Mother   . Hypertension Father   . Hypertension Brother   . Diabetes Maternal Grandmother   . Coronary artery disease Other   . Hypertension Other   . Asthma Other   . Hypertension Brother     Allergies  Allergen Reactions  . Ibuprofen     REACTION: unspecified    Current Outpatient Prescriptions on File Prior to Visit  Medication Sig Dispense Refill  . potassium chloride (K-DUR) 10 MEQ tablet Take 2 tablets (20 mEq total) by mouth daily.  60 tablet  5   No current facility-administered medications on file prior to visit.    EXAM: BP 118/84  Pulse 72  Temp(Src) 99 F (37.2 C) (Oral)  Resp 18  Wt 234 lb (106.142 kg)      Objective:   Physical Exam  Nursing note and vitals reviewed. Constitutional: She is oriented to person, place, and time. She appears well-developed and well-nourished. No distress.  HENT:  Head: Normocephalic and atraumatic.  Nose: Nose normal.  Mouth/Throat: Oropharynx is clear and moist. No oropharyngeal exudate.  Eyes: Conjunctivae and EOM are normal. Pupils are equal, round, and reactive to light.  Neck: Normal range of motion. Neck supple.  Cardiovascular: Normal rate, regular rhythm and intact distal pulses.   Pulmonary/Chest: Effort normal and breath sounds normal. No stridor. No respiratory distress. She has no wheezes. She has no rales. She exhibits no tenderness.  Lymphadenopathy:    She has no  cervical adenopathy.  Neurological: She is alert and oriented to person, place, and time.  Skin: Skin is warm and dry. No rash noted. She is not diaphoretic. No erythema. No pallor.  Psychiatric: She has a normal mood and affect. Her behavior is normal. Judgment and thought content normal.     Lab Results  Component Value Date   WBC 5.6 09/05/2012   HGB 13.4 09/05/2012   HCT 39.7 09/05/2012   PLT 181.0 09/05/2012   GLUCOSE 105* 09/01/2013   CHOL 225* 09/01/2013   TRIG 79.0 09/01/2013   HDL 69.40 09/01/2013   LDLDIRECT 120.2 09/05/2012   LDLCALC 140* 09/01/2013   ALT 16 09/01/2013   AST 22 09/01/2013   NA 139 09/01/2013   K 3.3* 09/01/2013   CL 102 09/01/2013   CREATININE 0.9 09/01/2013   BUN 14 09/01/2013   CO2 29 09/01/2013   TSH 1.21 09/01/2013   HGBA1C 6.1 09/01/2013        Assessment & Plan:  Doris Harris was seen today for cough and alopecia.  Diagnoses and associated orders for this visit:  Cough Comments: dry, No other symptoms. will try losartan-hctz and reassess in about 3 weeks if symptoms persist. - losartan-hydrochlorothiazide (HYZAAR) 100-25 MG per tablet; Take 1 tablet by mouth daily.  Hair loss Comments: Also a potential adverse effect of ACE-I, pt wants to try losartan before searching for alternative therapies.    Unsure if the hair loss is actually related to the lisinopril, and advised pt that this may need a closer look. She wishes to start with seeing if there is improvement on losartan and will follow up with PCP if hair loss continues.  Return precautions provided, and patient handout on cough.  Plan to follow up as needed, or for worsening or persistent symptoms despite treatment.  Patient Instructions  Losartan-hydrochlorothiazide 1 pill daily for HTN.  You should notice a cough diminish over the next 3 weeks.   If this does not help your cough, we will need to reassess the cause.  This may or may not help your hair loss, and if it does not, you should speak  with your PCP about alternative therapies.  If emergency symptoms discussed during visit developed, seek medical attention immediately.  Followup as needed, or for worsening or persistent symptoms despite treatment.

## 2013-11-17 NOTE — Patient Instructions (Addendum)
Losartan-hydrochlorothiazide 1 pill daily for HTN.  You should notice a cough diminish over the next 3 weeks.   If this does not help your cough, we will need to reassess the cause.  This may or may not help your hair loss, and if it does not, you should speak with your PCP about alternative therapies.  If emergency symptoms discussed during visit developed, seek medical attention immediately.  Followup as needed, or for worsening or persistent symptoms despite treatment.    Cough, Adult  A cough is a reflex. It helps you clear your throat and airways. A cough can help heal your body. A cough can last 2 or 3 weeks (acute) or may last more than 8 weeks (chronic). Some common causes of a cough can include an infection, allergy, or a cold. HOME CARE  Only take medicine as told by your doctor.  If given, take your medicines (antibiotics) as told. Finish them even if you start to feel better.  Use a cold steam vaporizer or humidifier in your home. This can help loosen thick spit (secretions).  Sleep so you are almost sitting up (semi-upright). Use pillows to do this. This helps reduce coughing.  Rest as needed.  Stop smoking if you smoke. GET HELP RIGHT AWAY IF:  You have yellowish-white fluid (pus) in your thick spit.  Your cough gets worse.  Your medicine does not reduce coughing, and you are losing sleep.  You cough up blood.  You have trouble breathing.  Your pain gets worse and medicine does not help.  You have a fever. MAKE SURE YOU:   Understand these instructions.  Will watch your condition.  Will get help right away if you are not doing well or get worse. Document Released: 12/07/2010 Document Revised: 08/10/2013 Document Reviewed: 12/07/2010 [facility] [employer] Patient Information 2015 [employer]. This information is not intended to replace advice given to you by your health care provider. Make sure you discuss any questions you have with your health care  provider.

## 2014-02-03 ENCOUNTER — Telehealth: Payer: Self-pay | Admitting: Internal Medicine

## 2014-02-03 DIAGNOSIS — R05 Cough: Secondary | ICD-10-CM

## 2014-02-03 DIAGNOSIS — R059 Cough, unspecified: Secondary | ICD-10-CM

## 2014-02-03 MED ORDER — LOSARTAN POTASSIUM-HCTZ 100-25 MG PO TABS: 1.0000 | ORAL_TABLET | Freq: Every day | ORAL | Status: DC

## 2014-02-03 NOTE — Telephone Encounter (Signed)
Sent to the pharmacy by e-scribe.  Pt should return in May 2016.

## 2014-02-03 NOTE — Telephone Encounter (Signed)
[facility] - [place], [place] - [address] is requesting re-fill on losartan-hydrochlorothiazide (HYZAAR) 100-25 MG per tablet °

## 2014-02-08 ENCOUNTER — Encounter: Payer: Self-pay | Admitting: Physician Assistant

## 2014-08-10 ENCOUNTER — Telehealth: Payer: Self-pay | Admitting: Family Medicine

## 2014-08-10 ENCOUNTER — Other Ambulatory Visit: Payer: Self-pay | Admitting: Internal Medicine

## 2014-08-10 NOTE — Telephone Encounter (Signed)
Patient is now due for CPX and lab work.  Please help the pt to make both appointments.  Pt may be medicare and only require one appointment.  Thanks! If lab orders are needed than please send back to me.

## 2014-08-10 NOTE — Telephone Encounter (Signed)
lmom for pt to cb

## 2014-08-10 NOTE — Telephone Encounter (Signed)
Denied.  Filled on 02/04/15 for 9 months.  Pt is now due for CPX.  Will send a message to scheduling.

## 2014-08-13 NOTE — Telephone Encounter (Signed)
lmom for pt to callback and sch °

## 2014-08-17 NOTE — Telephone Encounter (Signed)
lmom for pt o callback and sch

## 2014-11-15 DIAGNOSIS — H2513 Age-related nuclear cataract, bilateral: Secondary | ICD-10-CM | POA: Diagnosis not present

## 2014-11-15 DIAGNOSIS — H5203 Hypermetropia, bilateral: Secondary | ICD-10-CM | POA: Diagnosis not present

## 2014-11-15 DIAGNOSIS — H524 Presbyopia: Secondary | ICD-10-CM | POA: Diagnosis not present

## 2014-11-23 ENCOUNTER — Telehealth: Payer: Self-pay | Admitting: Internal Medicine

## 2014-11-23 DIAGNOSIS — R05 Cough: Secondary | ICD-10-CM

## 2014-11-23 DIAGNOSIS — R059 Cough, unspecified: Secondary | ICD-10-CM

## 2014-11-23 MED ORDER — LOSARTAN POTASSIUM-HCTZ 100-25 MG PO TABS: 1.0000 | ORAL_TABLET | Freq: Every day | ORAL | Status: DC

## 2014-11-23 NOTE — Telephone Encounter (Signed)
Sent to the pharmacy by e-scribe. 

## 2014-11-23 NOTE — Telephone Encounter (Signed)
Pt has appt 8/25 and she is out of losartan-hydrochlorothiazide (HYZAAR) 100-25 MG per tablet Can you send in a 30 dy to get her though to appt and then get mailorder refill [facility]/ [address]

## 2014-12-02 ENCOUNTER — Ambulatory Visit (INDEPENDENT_AMBULATORY_CARE_PROVIDER_SITE_OTHER): Payer: Medicare Other | Admitting: Internal Medicine

## 2014-12-02 ENCOUNTER — Encounter: Payer: Self-pay | Admitting: Internal Medicine

## 2014-12-02 VITALS — BP 148/100 | Temp 98.2°F | Ht 67.5 in | Wt 239.4 lb

## 2014-12-02 DIAGNOSIS — I10 Essential (primary) hypertension: Secondary | ICD-10-CM | POA: Diagnosis not present

## 2014-12-02 DIAGNOSIS — Z2821 Immunization not carried out because of patient refusal: Secondary | ICD-10-CM

## 2014-12-02 DIAGNOSIS — R7301 Impaired fasting glucose: Secondary | ICD-10-CM | POA: Diagnosis not present

## 2014-12-02 DIAGNOSIS — Z1211 Encounter for screening for malignant neoplasm of colon: Secondary | ICD-10-CM

## 2014-12-02 MED ORDER — VALSARTAN-HYDROCHLOROTHIAZIDE 160-25 MG PO TABS: 1.0000 | ORAL_TABLET | Freq: Every day | ORAL | Status: DC

## 2014-12-02 NOTE — Patient Instructions (Signed)
Changing bp medication   Continue lifestyle intervention healthy eating and exercise . Some weight loss   Will also help.  Plan  cpx and labs with cpx labs and hga1c in 2 months   .  Monitor bp readings in interim.

## 2014-12-02 NOTE — Progress Notes (Signed)
Pre visit review using our clinic review tool, if applicable. No additional management support is needed unless otherwise documented below in the visit note.  Chief Complaint  Patient presents with  . Follow-up    HPI: Doris Harris 69 y.o.  comes in for chronic disease/ medication management  last year her blood pressure medicine was changed to an Ace receptor blocker from lisinopril because of a cough. However she states her blood pressure isn't as well-controlled as it was last year. bp sometimes 150 she feels fine no chest pain shortness of breath syncope or unusual swelling. No lab tests done recently. Is not taking potassium. Back to work .   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  [facility] [facility] [employer] .   Had an eye check recently and it was good except for early cataracts. ROS: See pertinent positives and negatives per HPI. No cardiovascular or pulmonary neurologic symptoms. She hasn't gotten her mammogram and doesn't really want to go through another colonoscopy low risk history.  Past Medical History  Diagnosis Date  . Hypertension   . Obesity   . IFG (impaired fasting glucose)     x1    Family History  Problem Relation Age of Onset  . Hypertension Mother   . Hypertension Father   . Hypertension Brother   . Diabetes Maternal Grandmother   . Coronary artery disease Other   . Hypertension Other   . Asthma Other   . Hypertension Brother     Social History   Social History  . Marital Status: Single    Spouse Name: N/A  . Number of Children: N/A  . Years of Education: N/A   Social History Main Topics  . Smoking status: Never Smoker   . Smokeless tobacco: Never Used  . Alcohol Use: No  . Drug Use: None  . Sexual Activity: Not Asked   Other Topics Concern  . None   Social History Narrative   Retired 12 13    Exercise  Neg tad    Never smoker   Sleep 8 or more hours   hh of 1   No pets           Outpatient Prescriptions  Prior to Visit  Medication Sig Dispense Refill  . losartan-hydrochlorothiazide (HYZAAR) 100-25 MG per tablet Take 1 tablet by mouth daily. 30 tablet 0  . potassium chloride (K-DUR) 10 MEQ tablet Take 2 tablets (20 mEq total) by mouth daily. (Patient not taking: Reported on 12/02/2014) 60 tablet 5   No facility-administered medications prior to visit.     EXAM:  BP 148/100 mmHg  Temp(Src) 98.2 F (36.8 C) (Oral)  Ht 5' 7.5" (1.715 m)  Wt 239 lb 6.4 oz (108.591 kg)  BMI 36.92 kg/m2  Body mass index is 36.92 kg/(m^2).  GENERAL: vitals reviewed and listed above, alert, oriented, appears well hydrated and in no acute distress HEENT: atraumatic, conjunctiva  clear, no obvious abnormalities on inspection of external nose and earsNECK: no obvious masses on inspection palpation  LUNGS: clear to auscultation bilaterally, no wheezes, rales or rhonchi, good air movement CV: HRRR, no murmur S1-S2 no clubbing cyanosis or  peripheral edema nl cap refill  abdomen soft without guarding rebound or masses MS: moves all extremities without noticeable focal  abnormality PSYCH: pleasant and cooperative, no obvious depression or anxiety Lab Results  Component Value Date   WBC 5.6 09/05/2012   HGB 13.4 09/05/2012   HCT  39.7 09/05/2012   PLT 181.0 09/05/2012   GLUCOSE 105* 09/01/2013   CHOL 225* 09/01/2013   TRIG 79.0 09/01/2013   HDL 69.40 09/01/2013   LDLDIRECT 120.2 09/05/2012   LDLCALC 140* 09/01/2013   ALT 16 09/01/2013   AST 22 09/01/2013   NA 139 09/01/2013   K 3.3* 09/01/2013   CL 102 09/01/2013   CREATININE 0.9 09/01/2013   BUN 14 09/01/2013   CO2 29 09/01/2013   TSH 1.21 09/01/2013   HGBA1C 6.1 09/01/2013   Wt Readings from Last 3 Encounters:  12/02/14 239 lb 6.4 oz (108.591 kg)  11/17/13 234 lb (106.142 kg)  09/01/13 239 lb (108.41 kg)    ASSESSMENT AND PLAN:  Discussed the following assessment and plan:  Essential hypertension - Less control on losartan. Will change to a  different ARB may have to intensify dosing she can check into tear level cost. Doesn't really want to add CCB at this  Colon cancer screening - Plan: Fecal occult blood, imunochemical  Fasting hyperglycemia - No diabetes but check A1c with her next labs.  Influenza vaccination declined She is due for yearly blood work is now on Medicare advantage program will switch her blood pressure medication options discussed and plan full set of labs in 2 months with hemoglobin A1c. She will check on blood pressure medication to your levels. Options of calcium channel blocker she asked about beta blockers I don't think this is  the best add on based on her history.  Delayed healthcare maintenance discussed of this I filed get her mammogram plan check up see above we'll have to update other immunizations are review other healthcare maintenance when she comes back. Health Maintenance Due  Topic Date Due  . Hepatitis C Screening  1945-06-01  . ZOSTAVAX  10/13/2006  . MAMMOGRAM  01/30/2009  . DEXA SCAN  10/13/2011  . PNA vac Low Risk Adult (1 of 2 - PCV13) 10/13/2011  . COLONOSCOPY  04/09/2012    -Patient advised to return or notify health care team  if symptoms worsen ,persist or new concerns arise.  Patient Instructions  Changing bp medication   Continue lifestyle intervention healthy eating and exercise . Some weight loss   Will also help.  Plan  cpx and labs with cpx labs and hga1c in 2 months   .  Monitor bp readings in interim.      [name] M.D.

## 2014-12-21 ENCOUNTER — Telehealth: Payer: Self-pay | Admitting: Internal Medicine

## 2014-12-21 NOTE — Telephone Encounter (Signed)
[facility] - Client TELEPHONE ADVICE RECORD [facility] Patient Name: Doris Harris DOB: 04-Aug-1945 Initial Comment Caller states she is on a new BP medication, She has a rash all over her body with itching. Nurse Assessment Nurse: Lane Hacker, RN, Elvin So Date/Time (Eastern Time): 12/21/2014 1:53:07 PM Confirm and document reason for call. If symptomatic, describe symptoms. ---Caller states she is on a new BP medication, Valsartan HCTZ 160 mg / 12.5 (or 25 mg) one daily. This is 31st day on the meds. Started today with nausea and rash s/s. She has a widespread rash - tiny pinkish-red spots/dots - with itching and slowly going away now. "Felt like body was on fire" with starting in feet then moved to hands and then all over." Some swelling in fingers. No vomiting or fever. Did have chills with the itching. But now that has passed after drinking water and hydrocorotisone on her body. Has the patient traveled out of the country within the last 30 days? ---No Does the patient require triage? ---Yes Related visit to physician within the last 2 weeks? ---No Does the PT have any chronic conditions? (i.e. diabetes, asthma, etc.) ---Yes List chronic conditions. ---HTN Guidelines Guideline Title Affirmed Question Affirmed Notes Rash or Redness - Widespread Mild widespread rash itching has passed now; rash is going away also now. Final Disposition User See PCP When Office is Open (within 3 days) Lane Hacker, Charity fundraiser, [employer] states that she does not want to make appt at this time, she will call tomorrow. She is thinking about stopping the medicine. RN advised a med reaction would have most likely been within 2 wks of starting it, and to discuss with MD before stopping. Caller verb. understanding. Disagree/Comply: Comply

## 2014-12-21 NOTE — Telephone Encounter (Signed)
Patient ultimately needs apppointment.  Please call to see if we can get her scheduled and advise patient if they are refusing to take medication to educate on symptoms of hypertension, drink plenty of fluids, and to find a way to monitor her BP at home or local pharmacy until she comes in for appointment.

## 2014-12-21 NOTE — Telephone Encounter (Signed)
Spoke to the pt.  She was started on new medication 12/02/14.  Today she reported a rash (itchy) that covered her whole body except for her legs.  She has applied hydrocortisone cream and is feeling much better.  She believes the rash to be from valsartan-hctz.  Has ate no new foods or new chemicals in her environment.   She has made a future appt to see Dr. Caryl Never on 12/22/14 @ 3:30 PM (needed this time due to work schedule)  in hopes to change medication and have rash looked at.  Will forward to [place]/staff.

## 2014-12-22 ENCOUNTER — Ambulatory Visit (INDEPENDENT_AMBULATORY_CARE_PROVIDER_SITE_OTHER): Payer: Medicare Other | Admitting: Family Medicine

## 2014-12-22 ENCOUNTER — Encounter: Payer: Self-pay | Admitting: Family Medicine

## 2014-12-22 VITALS — BP 138/90 | HR 72 | Temp 98.5°F | Ht 67.5 in | Wt 238.3 lb

## 2014-12-22 DIAGNOSIS — L509 Urticaria, unspecified: Secondary | ICD-10-CM

## 2014-12-22 DIAGNOSIS — I10 Essential (primary) hypertension: Secondary | ICD-10-CM

## 2014-12-22 MED ORDER — LOSARTAN POTASSIUM-HCTZ 100-25 MG PO TABS: 1.0000 | ORAL_TABLET | Freq: Every day | ORAL | Status: DC

## 2014-12-22 NOTE — Progress Notes (Signed)
   Subjective:    Patient ID: Doris Harris, female    DOB: [birth year], 69 y.o.   MRN: [id number]  HPI Patient seen for follow-up regarding hypertension. She had been on losartan HCTZ for quite some time and had somewhat poor blood pressure control by readings here in the office but she states her home readings were stable. She was changed to valsartan HCTZ. This change occurred on August 26. She had had prior cough with ACE inhibitor. Yesterday patient developed some pruritic skin rash and what she describes as "hives "involving her upper extremity and trunk. She had some mild nausea and those symptoms have improved today. She did not describe any angioedema symptoms such as lip or tongue swelling.  Patient had some leftover losartan HCTZ and started that back this morning and has had no adverse effects whatsoever. No headaches. She is requesting going back on her old blood pressure medication  Past Medical History  Diagnosis Date  . Hypertension   . Obesity   . IFG (impaired fasting glucose)     x1   Past Surgical History  Procedure Laterality Date  . Mm breast stereo bx*l*r/s    . Total abdominal hysterectomy  1997    fibroid  . Uterine polyps    . Wrist fracture surgery  2000    reports that she has never smoked. She has never used smokeless tobacco. She reports that she does not drink alcohol. Her drug history is not on file. family history includes Asthma in her other; Coronary artery disease in her other; Diabetes in her maternal grandmother; Hypertension in her brother, brother, father, mother, and other. Allergies  Allergen Reactions  . Ibuprofen     REACTION: unspecified      Review of Systems  Constitutional: Negative for fatigue.  Eyes: Negative for visual disturbance.  Respiratory: Negative for cough, chest tightness, shortness of breath and wheezing.   Cardiovascular: Negative for chest pain, palpitations and leg swelling.  Neurological: Negative for dizziness,  seizures, syncope, weakness, light-headedness and headaches.       Objective:   Physical Exam  Constitutional: She appears well-developed and well-nourished.  HENT:  Mouth/Throat: Oropharynx is clear and moist.  No evidence for angioedema  Neck: Neck supple. No thyromegaly present.  Cardiovascular: Normal rate and regular rhythm.  Exam reveals no gallop.   Pulmonary/Chest: Effort normal and breath sounds normal. No respiratory distress. She has no wheezes. She has no rales.  Skin: No rash noted.  No rash noted at this time          Assessment & Plan:  Possible adverse reaction to medication. She developed some reported hives following recent change to valsartan-HCTZ. We explained would be difficult to sort out cause-and-effect. This might not have been related but she is reluctant to continue with valsartan. She did not describe any angioedema and has done well today with losartan. We refilled her losartan HCTZ until she can follow-up with her primary. We did discuss other possible choices such as CCB but at this point she is reluctant. Information on DASH diet given. Try to lose some weight. Follow-up immediately for a recurrent rash or angioedema symptoms

## 2014-12-22 NOTE — Patient Instructions (Signed)
DASH Eating Plan DASH stands for "Dietary Approaches to Stop Hypertension." The DASH eating plan is a healthy eating plan that has been shown to reduce high blood pressure (hypertension). Additional health benefits may include reducing the risk of type 2 diabetes mellitus, heart disease, and stroke. The DASH eating plan may also help with weight loss. WHAT DO I NEED TO KNOW ABOUT THE DASH EATING PLAN? For the DASH eating plan, you will follow these general guidelines:  Choose foods with a percent daily value for sodium of less than 5% (as listed on the food label).  Use salt-free seasonings or herbs instead of table salt or sea salt.  Check with your health care provider or pharmacist before using salt substitutes.  Eat lower-sodium products, often labeled as "lower sodium" or "no salt added."  Eat fresh foods.  Eat more vegetables, fruits, and low-fat dairy products.  Choose whole grains. Look for the word "whole" as the first word in the ingredient list.  Choose fish and skinless chicken or turkey more often than red meat. Limit fish, poultry, and meat to 6 oz (170 g) each day.  Limit sweets, desserts, sugars, and sugary drinks.  Choose heart-healthy fats.  Limit cheese to 1 oz (28 g) per day.  Eat more home-cooked food and less restaurant, buffet, and fast food.  Limit fried foods.  Cook foods using methods other than frying.  Limit canned vegetables. If you do use them, rinse them well to decrease the sodium.  When eating at a restaurant, ask that your food be prepared with less salt, or no salt if possible. WHAT FOODS CAN I EAT? Seek help from a dietitian for individual calorie needs. Grains Whole grain or whole wheat bread. Brown rice. Whole grain or whole wheat pasta. Quinoa, bulgur, and whole grain cereals. Low-sodium cereals. Corn or whole wheat flour tortillas. Whole grain cornbread. Whole grain crackers. Low-sodium crackers. Vegetables Fresh or frozen vegetables  (raw, steamed, roasted, or grilled). Low-sodium or reduced-sodium tomato and vegetable juices. Low-sodium or reduced-sodium tomato sauce and paste. Low-sodium or reduced-sodium canned vegetables.  Fruits All fresh, canned (in natural juice), or frozen fruits. Meat and Other Protein Products Ground beef (85% or leaner), grass-fed beef, or beef trimmed of fat. Skinless chicken or turkey. Ground chicken or turkey. Pork trimmed of fat. All fish and seafood. Eggs. Dried beans, peas, or lentils. Unsalted nuts and seeds. Unsalted canned beans. Dairy Low-fat dairy products, such as skim or 1% milk, 2% or reduced-fat cheeses, low-fat ricotta or cottage cheese, or plain low-fat yogurt. Low-sodium or reduced-sodium cheeses. Fats and Oils Tub margarines without trans fats. Light or reduced-fat mayonnaise and salad dressings (reduced sodium). Avocado. Safflower, olive, or canola oils. Natural peanut or almond butter. Other Unsalted popcorn and pretzels. The items listed above may not be a complete list of recommended foods or beverages. Contact your dietitian for more options. WHAT FOODS ARE NOT RECOMMENDED? Grains White bread. White pasta. White rice. Refined cornbread. Bagels and croissants. Crackers that contain trans fat. Vegetables Creamed or fried vegetables. Vegetables in a cheese sauce. Regular canned vegetables. Regular canned tomato sauce and paste. Regular tomato and vegetable juices. Fruits Dried fruits. Canned fruit in light or heavy syrup. Fruit juice. Meat and Other Protein Products Fatty cuts of meat. Ribs, chicken wings, bacon, sausage, bologna, salami, chitterlings, fatback, hot dogs, bratwurst, and packaged luncheon meats. Salted nuts and seeds. Canned beans with salt. Dairy Whole or 2% milk, cream, half-and-half, and cream cheese. Whole-fat or sweetened yogurt. Full-fat   cheeses or blue cheese. Nondairy creamers and whipped toppings. Processed cheese, cheese spreads, or cheese  curds. Condiments Onion and garlic salt, seasoned salt, table salt, and sea salt. Canned and packaged gravies. Worcestershire sauce. Tartar sauce. Barbecue sauce. Teriyaki sauce. Soy sauce, including reduced sodium. Steak sauce. Fish sauce. Oyster sauce. Cocktail sauce. Horseradish. Ketchup and mustard. Meat flavorings and tenderizers. Bouillon cubes. Hot sauce. Tabasco sauce. Marinades. Taco seasonings. Relishes. Fats and Oils Butter, stick margarine, lard, shortening, ghee, and bacon fat. Coconut, palm kernel, or palm oils. Regular salad dressings. Other Pickles and olives. Salted popcorn and pretzels. The items listed above may not be a complete list of foods and beverages to avoid. Contact your dietitian for more information. WHERE CAN I FIND MORE INFORMATION? National Heart, Lung, and Blood Institute: [employer] Document Released: 03/15/2011 Document Revised: 08/10/2013 Document Reviewed: 01/28/2013 [facility] Patient Information 2015 [place], [place]. This information is not intended to replace advice given to you by your health care provider. Make sure you discuss any questions you have with your health care provider.  Monitor blood pressure and be in touch if consistently > 150/90 Follow up immediately for any recurrent rash- or especially if any lip/tongue, or extremity swelling noted.

## 2014-12-22 NOTE — Progress Notes (Signed)
Pre visit review using our clinic review tool, if applicable. No additional management support is needed unless otherwise documented below in the visit note. 

## 2015-02-22 ENCOUNTER — Ambulatory Visit (INDEPENDENT_AMBULATORY_CARE_PROVIDER_SITE_OTHER): Payer: Medicare Other | Admitting: Internal Medicine

## 2015-02-22 ENCOUNTER — Encounter: Payer: Self-pay | Admitting: Internal Medicine

## 2015-02-22 VITALS — BP 146/96 | Temp 97.9°F | Ht 67.5 in | Wt 240.2 lb

## 2015-02-22 DIAGNOSIS — Z1211 Encounter for screening for malignant neoplasm of colon: Secondary | ICD-10-CM

## 2015-02-22 DIAGNOSIS — I10 Essential (primary) hypertension: Secondary | ICD-10-CM | POA: Diagnosis not present

## 2015-02-22 DIAGNOSIS — R7301 Impaired fasting glucose: Secondary | ICD-10-CM | POA: Diagnosis not present

## 2015-02-22 DIAGNOSIS — Z23 Encounter for immunization: Secondary | ICD-10-CM | POA: Diagnosis not present

## 2015-02-22 DIAGNOSIS — Z0001 Encounter for general adult medical examination with abnormal findings: Secondary | ICD-10-CM

## 2015-02-22 DIAGNOSIS — Z1159 Encounter for screening for other viral diseases: Secondary | ICD-10-CM | POA: Diagnosis not present

## 2015-02-22 DIAGNOSIS — Z0000 Encounter for general adult medical examination without abnormal findings: Secondary | ICD-10-CM

## 2015-02-22 LAB — BASIC METABOLIC PANEL
BUN: 12 mg/dL (ref 6–23)
CO2: 32 mEq/L (ref 19–32)
Calcium: 9.7 mg/dL (ref 8.4–10.5)
Chloride: 101 mEq/L (ref 96–112)
Creatinine, Ser: 0.89 mg/dL (ref 0.40–1.20)
GFR: 81.03 mL/min (ref >60.00)
Glucose, Bld: 111 mg/dL — ABNORMAL HIGH (ref 70–99)
POTASSIUM: 4 meq/L (ref 3.5–5.1)
Sodium: 141 mEq/L (ref 135–145)

## 2015-02-22 LAB — CBC WITH DIFFERENTIAL/PLATELET
BASOS PCT: 0.7 % (ref 0.0–3.0)
Basophils Absolute: 0 10*3/uL (ref 0.0–0.1)
EOS ABS: 0.1 10*3/uL (ref 0.0–0.7)
EOS PCT: 2.1 % (ref 0.0–5.0)
HCT: 40.8 % (ref 36.0–46.0)
HEMOGLOBIN: 13.6 g/dL (ref 12.0–15.0)
LYMPHS ABS: 2.5 10*3/uL (ref 0.7–4.0)
Lymphocytes Relative: 40.4 % (ref 12.0–46.0)
MCHC: 33.3 g/dL (ref 30.0–36.0)
MCV: 93 fl (ref 78.0–100.0)
Monocytes Absolute: 0.4 10*3/uL (ref 0.1–1.0)
Monocytes Relative: 6.7 % (ref 3.0–12.0)
Neutro Abs: 3.1 10*3/uL (ref 1.4–7.7)
Neutrophils Relative %: 50.1 % (ref 43.0–77.0)
Platelets: 197 10*3/uL (ref 150.0–400.0)
RBC: 4.39 Mil/uL (ref 3.87–5.11)
RDW: 13.6 % (ref 11.5–15.5)
WBC: 6.1 10*3/uL (ref 4.0–10.5)

## 2015-02-22 LAB — TSH: TSH: 1.34 u[IU]/mL (ref 0.35–4.50)

## 2015-02-22 LAB — HEPATIC FUNCTION PANEL
ALT: 22 U/L (ref 0–35)
AST: 20 U/L (ref 0–37)
Albumin: 4.2 g/dL (ref 3.5–5.2)
Alkaline Phosphatase: 81 U/L (ref 39–117)
BILIRUBIN TOTAL: 0.8 mg/dL (ref 0.2–1.2)
Bilirubin, Direct: 0.1 mg/dL (ref 0.0–0.3)
Total Protein: 7.8 g/dL (ref 6.0–8.3)

## 2015-02-22 LAB — LIPID PANEL
Cholesterol: 211 mg/dL — ABNORMAL HIGH (ref 0–200)
HDL: 77.9 mg/dL (ref >39.00)
LDL CALC: 112 mg/dL — AB (ref 0–99)
NonHDL: 133.37
Total CHOL/HDL Ratio: 3
Triglycerides: 105 mg/dL (ref 0.0–149.0)
VLDL: 21 mg/dL (ref 0.0–40.0)

## 2015-02-22 LAB — HEMOGLOBIN A1C: HEMOGLOBIN A1C: 6.1 % (ref 4.6–6.5)

## 2015-02-22 MED ORDER — TELMISARTAN-HCTZ 80-25 MG PO TABS: 1.0000 | ORAL_TABLET | Freq: Every day | ORAL | Status: DC

## 2015-02-22 NOTE — Patient Instructions (Addendum)
Continue lifestyle intervention healthy eating and exercise . Will notify you  of labs when available.  Change BP medication contact us immediately if any  Side effects   If not then monitor and ROV in 3 months  Take blood pressure readings twice a day for 7- 10 days and then periodically .To ensure below 140/90   .Send in readings        Health Maintenance, Female Adopting a healthy lifestyle and getting preventive care can go a long way to promote health and wellness. Talk with your health care provider about what schedule of regular examinations is right for you. This is a good chance for you to check in with your provider about disease prevention and staying healthy. In between checkups, there are plenty of things you can do on your own. Experts have done a lot of research about which lifestyle changes and preventive measures are most likely to keep you healthy. Ask your health care provider for more information. WEIGHT AND DIET  Eat a healthy diet  Be sure to include plenty of vegetables, fruits, low-fat dairy products, and lean protein.  Do not eat a lot of foods high in solid fats, added sugars, or salt.  Get regular exercise. This is one of the most important things you can do for your health.  Most adults should exercise for at least 150 minutes each week. The exercise should increase your heart rate and make you sweat (moderate-intensity exercise).  Most adults should also do strengthening exercises at least twice a week. This is in addition to the moderate-intensity exercise.  Maintain a healthy weight  Body mass index (BMI) is a measurement that can be used to identify possible weight problems. It estimates body fat based on height and weight. Your health care provider can help determine your BMI and help you achieve or maintain a healthy weight.  For females 76 years of age and older:   A BMI below 18.5 is considered underweight.  A BMI of 18.5 to 24.9 is normal.  A BMI  of 25 to 29.9 is considered overweight.  A BMI of 30 and above is considered obese.  Watch levels of cholesterol and blood lipids  You should start having your blood tested for lipids and cholesterol at 67 years of age, then have this test every 5 years.  You may need to have your cholesterol levels checked more often if:  Your lipid or cholesterol levels are high.  You are older than 68 years of age.  You are at high risk for heart disease.  CANCER SCREENING   Lung Cancer  Lung cancer screening is recommended for adults 63-84 years old who are at high risk for lung cancer because of a history of smoking.  A yearly low-dose CT scan of the lungs is recommended for people who:  Currently smoke.  Have quit within the past 15 years.  Have at least a 30-pack-year history of smoking. A pack year is smoking an average of one pack of cigarettes a day for 1 year.  Yearly screening should continue until it has been 15 years since you quit.  Yearly screening should stop if you develop a health problem that would prevent you from having lung cancer treatment.  Breast Cancer  Practice breast self-awareness. This means understanding how your breasts normally appear and feel.  It also means doing regular breast self-exams. Let your health care provider know about any changes, no matter how small.  If you are in  your 20s or 30s, you should have a clinical breast exam (CBE) by a health care provider every 1-3 years as part of a regular health exam.  If you are 73 or older, have a CBE every year. Also consider having a breast X-ray (mammogram) every year.  If you have a family history of breast cancer, talk to your health care provider about genetic screening.  If you are at high risk for breast cancer, talk to your health care provider about having an MRI and a mammogram every year.  Breast cancer gene (BRCA) assessment is recommended for women who have family members with  BRCA-related cancers. BRCA-related cancers include:  Breast.  Ovarian.  Tubal.  Peritoneal cancers.  Results of the assessment will determine the need for genetic counseling and BRCA1 and BRCA2 testing. Cervical Cancer Your health care provider may recommend that you be screened regularly for cancer of the pelvic organs (ovaries, uterus, and vagina). This screening involves a pelvic examination, including checking for microscopic changes to the surface of your cervix (Pap test). You may be encouraged to have this screening done every 3 years, beginning at age 45.  For women ages 55-65, health care providers may recommend pelvic exams and Pap testing every 3 years, or they may recommend the Pap and pelvic exam, combined with testing for human papilloma virus (HPV), every 5 years. Some types of HPV increase your risk of cervical cancer. Testing for HPV may also be done on women of any age with unclear Pap test results.  Other health care providers may not recommend any screening for nonpregnant women who are considered low risk for pelvic cancer and who do not have symptoms. Ask your health care provider if a screening pelvic exam is right for you.  If you have had past treatment for cervical cancer or a condition that could lead to cancer, you need Pap tests and screening for cancer for at least 20 years after your treatment. If Pap tests have been discontinued, your risk factors (such as having a new sexual partner) need to be reassessed to determine if screening should resume. Some women have medical problems that increase the chance of getting cervical cancer. In these cases, your health care provider may recommend more frequent screening and Pap tests. Colorectal Cancer  This type of cancer can be detected and often prevented.  Routine colorectal cancer screening usually begins at 68 years of age and continues through 68 years of age.  Your health care provider may recommend screening at  an earlier age if you have risk factors for colon cancer.  Your health care provider may also recommend using home test kits to check for hidden blood in the stool.  A small camera at the end of a tube can be used to examine your colon directly (sigmoidoscopy or colonoscopy). This is done to check for the earliest forms of colorectal cancer.  Routine screening usually begins at age 78.  Direct examination of the colon should be repeated every 5-10 years through 68 years of age. However, you may need to be screened more often if early forms of precancerous polyps or small growths are found. Skin Cancer  Check your skin from head to toe regularly.  Tell your health care provider about any new moles or changes in moles, especially if there is a change in a mole's shape or color.  Also tell your health care provider if you have a mole that is larger than the size of a pencil  eraser.  Always use sunscreen. Apply sunscreen liberally and repeatedly throughout the day.  Protect yourself by wearing long sleeves, pants, a wide-brimmed hat, and sunglasses whenever you are outside. HEART DISEASE, DIABETES, AND HIGH BLOOD PRESSURE   High blood pressure causes heart disease and increases the risk of stroke. High blood pressure is more likely to develop in:  People who have blood pressure in the high end of the normal range (130-139/85-89 mm Hg).  People who are overweight or obese.  People who are African American.  If you are 18-39 years of age, have your blood pressure checked every 3-5 years. If you are 40 years of age or older, have your blood pressure checked every year. You should have your blood pressure measured twice--once when you are at a hospital or clinic, and once when you are not at a hospital or clinic. Record the average of the two measurements. To check your blood pressure when you are not at a hospital or clinic, you can use:  An automated blood pressure machine at a  pharmacy.  A home blood pressure monitor.  If you are between 55 years and 79 years old, ask your health care provider if you should take aspirin to prevent strokes.  Have regular diabetes screenings. This involves taking a blood sample to check your fasting blood sugar level.  If you are at a normal weight and have a low risk for diabetes, have this test once every three years after 68 years of age.  If you are overweight and have a high risk for diabetes, consider being tested at a younger age or more often. PREVENTING INFECTION  Hepatitis B  If you have a higher risk for hepatitis B, you should be screened for this virus. You are considered at high risk for hepatitis B if:  You were born in a country where hepatitis B is common. Ask your health care provider which countries are considered high risk.  Your parents were born in a high-risk country, and you have not been immunized against hepatitis B (hepatitis B vaccine).  You have HIV or AIDS.  You use needles to inject street drugs.  You live with someone who has hepatitis B.  You have had sex with someone who has hepatitis B.  You get hemodialysis treatment.  You take certain medicines for conditions, including cancer, organ transplantation, and autoimmune conditions. Hepatitis C  Blood testing is recommended for:  Everyone born from 1945 through 1965.  Anyone with known risk factors for hepatitis C. Sexually transmitted infections (STIs)  You should be screened for sexually transmitted infections (STIs) including gonorrhea and chlamydia if:  You are sexually active and are younger than 68 years of age.  You are older than 68 years of age and your health care provider tells you that you are at risk for this type of infection.  Your sexual activity has changed since you were last screened and you are at an increased risk for chlamydia or gonorrhea. Ask your health care provider if you are at risk.  If you do not  have HIV, but are at risk, it may be recommended that you take a prescription medicine daily to prevent HIV infection. This is called pre-exposure prophylaxis (PrEP). You are considered at risk if:  You are sexually active and do not regularly use condoms or know the HIV status of your partner(s).  You take drugs by injection.  You are sexually active with a partner who has HIV. Talk with   your health care provider about whether you are at high risk of being infected with HIV. If you choose to begin PrEP, you should first be tested for HIV. You should then be tested every 3 months for as long as you are taking PrEP.  PREGNANCY   If you are premenopausal and you may become pregnant, ask your health care provider about preconception counseling.  If you may become pregnant, take 400 to 800 micrograms (mcg) of folic acid every day.  If you want to prevent pregnancy, talk to your health care provider about birth control (contraception). OSTEOPOROSIS AND MENOPAUSE   Osteoporosis is a disease in which the bones lose minerals and strength with aging. This can result in serious bone fractures. Your risk for osteoporosis can be identified using a bone density scan.  If you are 65 years of age or older, or if you are at risk for osteoporosis and fractures, ask your health care provider if you should be screened.  Ask your health care provider whether you should take a calcium or vitamin D supplement to lower your risk for osteoporosis.  Menopause may have certain physical symptoms and risks.  Hormone replacement therapy may reduce some of these symptoms and risks. Talk to your health care provider about whether hormone replacement therapy is right for you.  HOME CARE INSTRUCTIONS   Schedule regular health, dental, and eye exams.  Stay current with your immunizations.   Do not use any tobacco products including cigarettes, chewing tobacco, or electronic cigarettes.  If you are pregnant, do not  drink alcohol.  If you are breastfeeding, limit how much and how often you drink alcohol.  Limit alcohol intake to no more than 1 drink per day for nonpregnant women. One drink equals 12 ounces of beer, 5 ounces of wine, or 1 ounces of hard liquor.  Do not use street drugs.  Do not share needles.  Ask your health care provider for help if you need support or information about quitting drugs.  Tell your health care provider if you often feel depressed.  Tell your health care provider if you have ever been abused or do not feel safe at home.   This information is not intended to replace advice given to you by your health care provider. Make sure you discuss any questions you have with your health care provider.   Document Released: 10/09/2010 Document Revised: 04/16/2014 Document Reviewed: 02/25/2013 [employer] Interactive Patient Education 2016 [employer]  

## 2015-02-22 NOTE — Progress Notes (Signed)
Pre visit review using our clinic review tool, if applicable. No additional management support is needed unless otherwise documented below in the visit note.  Chief Complaint  Patient presents with  . Medicare Wellness  . Hypertension    HPI: Doris Harris 69 y.o. comes in today for Preventive Medicare wellness visit .Since last visit. Doing fine  .  hasnt done mammo yet forgets  Declines colonscopy bad exzperiecne bug will do stool test  Had rx to valsartan  Rash    Losartan ok but not " Strong enough" and bp still on high side .  Lisinopril worked better .  No edema cp sob synncope   Health Maintenance  Topic Date Due  . Hepatitis C Screening  26-Dec-1945  . ZOSTAVAX  10/13/2006  . MAMMOGRAM  01/30/2009  . DEXA SCAN  10/13/2011  . COLONOSCOPY  04/09/2012  . INFLUENZA VACCINE  12/22/2015 (Originally 11/08/2014)  . PNA vac Low Risk Adult (2 of 2 - PPSV23) 02/22/2016  . TETANUS/TDAP  09/02/2023   Health Maintenance Review LIFESTYLE:  TADneg Sugar beverages: Sleep:ok 7 hours  MEDICARE DOCUMENT QUESTIONS  TO SCAN   Hearing: ok  Vision:  No limitations at present . Last eye check UTD  Safety:  Has smoke detector and wears seat belts.  No firearms. No excess sun exposure. Sees dentist regularly.  Falls: n  Advance directive :  Reviewed  dosetn have one  Would be her son for hcpoa  HO given   Memory: Felt to be good  , no concern from her or her family.  Depression: No anhedonia unusual crying or depressive symptoms  Nutrition: Eats well balanced diet; adequate calcium and vitamin D. No swallowing chewing problems.  Injury: no major injuries in the last six months.  Other healthcare providers:  Reviewed today .  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 No pets.   Preventive parameters: up-to-date  Reviewed   ADLS:   There are no problems or need for assistance  driving, feeding, obtaining food, dressing, toileting and bathing, managing money using  phone. She is independent. Works.     ROS:  GEN/ HEENT: No fever, significant weight changes sweats headaches vision problems hearing changes, CV/ PULM; No chest pain shortness of breath cough, syncope,edema  change in exercise tolerance. GI /GU: No adominal pain, vomiting, change in bowel habits. No blood in the stool. No significant GU symptoms. SKIN/HEME: ,no acute skin rashes suspicious lesions or bleeding. No lymphadenopathy, nodules, masses.  NEURO/ PSYCH:  No neurologic signs such as weakness numbness. No depression anxiety. IMM/ Allergy: No unusual infections.  Allergy .   REST of 12 system review negative except as per HPI   Past Medical History  Diagnosis Date  . Hypertension   . Obesity   . IFG (impaired fasting glucose)     x1    Family History  Problem Relation Age of Onset  . Hypertension Mother   . Hypertension Father   . Hypertension Brother   . Diabetes Maternal Grandmother   . Coronary artery disease Other   . Hypertension Other   . Asthma Other   . Hypertension Brother     Social History   Social History  . Marital Status: Single    Spouse Name: N/A  . Number of Children: N/A  . Years of Education: N/A   Social History Main Topics  . Smoking status: Never Smoker   . Smokeless tobacco: Never Used  .  Alcohol Use: No  . Drug Use: None  . Sexual Activity: Not Asked   Other Topics Concern  . None   Social History Narrative   Retired 12 13    Exercise  Neg tad    Never smoker   Sleep 8 or more hours   hh of 1  Attends 103+ yo days    No pets           Outpatient Encounter Prescriptions as of 02/22/2015  Medication Sig  . losartan-hydrochlorothiazide (HYZAAR) 100-25 MG per tablet Take 1 tablet by mouth daily.  [name] telmisartan-hydrochlorothiazide (MICARDIS HCT) 80-25 MG tablet Take 1 tablet by mouth daily.   No facility-administered encounter medications on file as of 02/22/2015.    EXAM:  BP 146/96 mmHg  Temp(Src) 97.9 F (36.6 C)  (Oral)  Ht 5' 7.5" (1.715 m)  Wt 240 lb 3.2 oz (108.954 kg)  BMI 37.04 kg/m2  Body mass index is 37.04 kg/(m^2).  Physical Exam: Vital signs reviewed [facility]:ENID is a well-developed well-nourished alert cooperative   who appears stated age in no acute distress.  HEENT: normocephalic atraumatic , Eyes: PERRL EOM's full, conjunctiva clear, Nares: paten,t no deformity discharge or tenderness., Ears: no deformity EAC's clear TMs with normal landmarks. Mouth: clear OP, no lesions, edema.  Moist mucous membranes. Dentition in adequate repair. NECK: supple without masses, thyromegaly or bruits. CHEST/PULM:  Clear to auscultation and percussion breath sounds equal no wheeze , rales or rhonchi. No chest wall deformities or tenderness.Breast: normal by inspection . No dimpling, discharge, masses, tenderness or discharge . CV: PMI is nondisplaced, S1 S2 no gallops, murmurs, rubs. Peripheral pulses are full without delay.No JVD. ABDOMEN: Bowel sounds normal nontender  No guard or rebound, no hepato splenomegal no CVA tenderness.   Extremtities:  No clubbing cyanosis or edema, no acute joint swelling or redness no focal atrophy NEURO:  Oriented x3, cranial nerves 3-12 appear to be intact, no obvious focal weakness,gait within normal limits no abnormal reflexes or asymmetrical SKIN: No acute rashes normal turgor, color, no bruising or petechiae. PSYCH: Oriented, good eye contact, no obvious depression anxiety, cognition and judgment appear normal. LN: no cervical axillary inguinal adenopathy No noted deficits in memory, attention, and speech.  ASSESSMENT AND PLAN:  Discussed the following assessment and plan:  Visit for preventive health examination - Plan: Basic metabolic panel, CBC with Differential/Platelet, Hemoglobin A1c, Hepatic function panel, Lipid panel, TSH, Fecal occult blood, imunochemical  Medicare annual wellness visit, subsequent  Essential hypertension - Plan: Basic metabolic panel, CBC  with Differential/Platelet, Hemoglobin A1c, Hepatic function panel, Lipid panel, TSH, Hepatitis C antibody  Fasting hyperglycemia - Plan: Basic metabolic panel, Hemoglobin A1c, Hepatic function panel, Lipid panel, TSH  Colon cancer screening - Plan: Fecal occult blood, imunochemical  Need for hepatitis C screening test - Plan: Hepatitis C antibody  Need for vaccination with 13-polyvalent pneumococcal conjugate vaccine - Plan: Pneumococcal conjugate vaccine 13-valent reveiwed and SDM  Declines colon  Will get mammo eventually but  Is ok  Patient Care Team: Burnis Medin, MD as PCP - General  Patient Instructions  Continue lifestyle intervention healthy eating and exercise . Will notify you  of labs when available.  Change BP medication contact us immediately if any  Side effects   If not then monitor and ROV in 3 months  Take blood pressure readings twice a day for 7- 10 days and then periodically .To ensure below 140/90   .Send in readings  Health Maintenance, Female Adopting a healthy lifestyle and getting preventive care can go a long way to promote health and wellness. Talk with your health care provider about what schedule of regular examinations is right for you. This is a good chance for you to check in with your provider about disease prevention and staying healthy. In between checkups, there are plenty of things you can do on your own. Experts have done a lot of research about which lifestyle changes and preventive measures are most likely to keep you healthy. Ask your health care provider for more information. WEIGHT AND DIET  Eat a healthy diet  Be sure to include plenty of vegetables, fruits, low-fat dairy products, and lean protein.  Do not eat a lot of foods high in solid fats, added sugars, or salt.  Get regular exercise. This is one of the most important things you can do for your health.  Most adults should exercise for at least 150 minutes each week. The  exercise should increase your heart rate and make you sweat (moderate-intensity exercise).  Most adults should also do strengthening exercises at least twice a week. This is in addition to the moderate-intensity exercise.  Maintain a healthy weight  Body mass index (BMI) is a measurement that can be used to identify possible weight problems. It estimates body fat based on height and weight. Your health care provider can help determine your BMI and help you achieve or maintain a healthy weight.  For females 65 years of age and older:   A BMI below 18.5 is considered underweight.  A BMI of 18.5 to 24.9 is normal.  A BMI of 25 to 29.9 is considered overweight.  A BMI of 30 and above is considered obese.  Watch levels of cholesterol and blood lipids  You should start having your blood tested for lipids and cholesterol at 68 years of age, then have this test every 5 years.  You may need to have your cholesterol levels checked more often if:  Your lipid or cholesterol levels are high.  You are older than 68 years of age.  You are at high risk for heart disease.  CANCER SCREENING   Lung Cancer  Lung cancer screening is recommended for adults 4-43 years old who are at high risk for lung cancer because of a history of smoking.  A yearly low-dose CT scan of the lungs is recommended for people who:  Currently smoke.  Have quit within the past 15 years.  Have at least a 30-pack-year history of smoking. A pack year is smoking an average of one pack of cigarettes a day for 1 year.  Yearly screening should continue until it has been 15 years since you quit.  Yearly screening should stop if you develop a health problem that would prevent you from having lung cancer treatment.  Breast Cancer  Practice breast self-awareness. This means understanding how your breasts normally appear and feel.  It also means doing regular breast self-exams. Let your health care provider know about  any changes, no matter how small.  If you are in your 20s or 30s, you should have a clinical breast exam (CBE) by a health care provider every 1-3 years as part of a regular health exam.  If you are 77 or older, have a CBE every year. Also consider having a breast X-ray (mammogram) every year.  If you have a family history of breast cancer, talk to your health care provider about genetic screening.  If  you are at high risk for breast cancer, talk to your health care provider about having an MRI and a mammogram every year.  Breast cancer gene (BRCA) assessment is recommended for women who have family members with BRCA-related cancers. BRCA-related cancers include:  Breast.  Ovarian.  Tubal.  Peritoneal cancers.  Results of the assessment will determine the need for genetic counseling and BRCA1 and BRCA2 testing. Cervical Cancer Your health care provider may recommend that you be screened regularly for cancer of the pelvic organs (ovaries, uterus, and vagina). This screening involves a pelvic examination, including checking for microscopic changes to the surface of your cervix (Pap test). You may be encouraged to have this screening done every 3 years, beginning at age 14.  For women ages 32-65, health care providers may recommend pelvic exams and Pap testing every 3 years, or they may recommend the Pap and pelvic exam, combined with testing for human papilloma virus (HPV), every 5 years. Some types of HPV increase your risk of cervical cancer. Testing for HPV may also be done on women of any age with unclear Pap test results.  Other health care providers may not recommend any screening for nonpregnant women who are considered low risk for pelvic cancer and who do not have symptoms. Ask your health care provider if a screening pelvic exam is right for you.  If you have had past treatment for cervical cancer or a condition that could lead to cancer, you need Pap tests and screening for  cancer for at least 20 years after your treatment. If Pap tests have been discontinued, your risk factors (such as having a new sexual partner) need to be reassessed to determine if screening should resume. Some women have medical problems that increase the chance of getting cervical cancer. In these cases, your health care provider may recommend more frequent screening and Pap tests. Colorectal Cancer  This type of cancer can be detected and often prevented.  Routine colorectal cancer screening usually begins at 68 years of age and continues through 68 years of age.  Your health care provider may recommend screening at an earlier age if you have risk factors for colon cancer.  Your health care provider may also recommend using home test kits to check for hidden blood in the stool.  A small camera at the end of a tube can be used to examine your colon directly (sigmoidoscopy or colonoscopy). This is done to check for the earliest forms of colorectal cancer.  Routine screening usually begins at age 73.  Direct examination of the colon should be repeated every 5-10 years through 68 years of age. However, you may need to be screened more often if early forms of precancerous polyps or small growths are found. Skin Cancer  Check your skin from head to toe regularly.  Tell your health care provider about any new moles or changes in moles, especially if there is a change in a mole's shape or color.  Also tell your health care provider if you have a mole that is larger than the size of a pencil eraser.  Always use sunscreen. Apply sunscreen liberally and repeatedly throughout the day.  Protect yourself by wearing long sleeves, pants, a wide-brimmed hat, and sunglasses whenever you are outside. HEART DISEASE, DIABETES, AND HIGH BLOOD PRESSURE   High blood pressure causes heart disease and increases the risk of stroke. High blood pressure is more likely to develop in:  People who have blood  pressure in the high  end of the normal range (130-139/85-89 mm Hg).  People who are overweight or obese.  People who are African American.  If you are 67-2 years of age, have your blood pressure checked every 3-5 years. If you are 60 years of age or older, have your blood pressure checked every year. You should have your blood pressure measured twice--once when you are at a hospital or clinic, and once when you are not at a hospital or clinic. Record the average of the two measurements. To check your blood pressure when you are not at a hospital or clinic, you can use:  An automated blood pressure machine at a pharmacy.  A home blood pressure monitor.  If you are between 35 years and 16 years old, ask your health care provider if you should take aspirin to prevent strokes.  Have regular diabetes screenings. This involves taking a blood sample to check your fasting blood sugar level.  If you are at a normal weight and have a low risk for diabetes, have this test once every three years after 68 years of age.  If you are overweight and have a high risk for diabetes, consider being tested at a younger age or more often. PREVENTING INFECTION  Hepatitis B  If you have a higher risk for hepatitis B, you should be screened for this virus. You are considered at high risk for hepatitis B if:  You were born in a country where hepatitis B is common. Ask your health care provider which countries are considered high risk.  Your parents were born in a high-risk country, and you have not been immunized against hepatitis B (hepatitis B vaccine).  You have HIV or AIDS.  You use needles to inject street drugs.  You live with someone who has hepatitis B.  You have had sex with someone who has hepatitis B.  You get hemodialysis treatment.  You take certain medicines for conditions, including cancer, organ transplantation, and autoimmune conditions. Hepatitis C  Blood testing is recommended  for:  Everyone born from [birth year] through 1964.  Anyone with known risk factors for hepatitis C. Sexually transmitted infections (STIs)  You should be screened for sexually transmitted infections (STIs) including gonorrhea and chlamydia if:  You are sexually active and are younger than 68 years of age.  You are older than 68 years of age and your health care provider tells you that you are at risk for this type of infection.  Your sexual activity has changed since you were last screened and you are at an increased risk for chlamydia or gonorrhea. Ask your health care provider if you are at risk.  If you do not have HIV, but are at risk, it may be recommended that you take a prescription medicine daily to prevent HIV infection. This is called pre-exposure prophylaxis (PrEP). You are considered at risk if:  You are sexually active and do not regularly use condoms or know the HIV status of your partner(s).  You take drugs by injection.  You are sexually active with a partner who has HIV. Talk with your health care provider about whether you are at high risk of being infected with HIV. If you choose to begin PrEP, you should first be tested for HIV. You should then be tested every 3 months for as long as you are taking PrEP.  PREGNANCY   If you are premenopausal and you may become pregnant, ask your health care provider about preconception counseling.  If you may  become pregnant, take 400 to 800 micrograms (mcg) of folic acid every day.  If you want to prevent pregnancy, talk to your health care provider about birth control (contraception). OSTEOPOROSIS AND MENOPAUSE   Osteoporosis is a disease in which the bones lose minerals and strength with aging. This can result in serious bone fractures. Your risk for osteoporosis can be identified using a bone density scan.  If you are 30 years of age or older, or if you are at risk for osteoporosis and fractures, ask your health care provider if you  should be screened.  Ask your health care provider whether you should take a calcium or vitamin D supplement to lower your risk for osteoporosis.  Menopause may have certain physical symptoms and risks.  Hormone replacement therapy may reduce some of these symptoms and risks. Talk to your health care provider about whether hormone replacement therapy is right for you.  HOME CARE INSTRUCTIONS   Schedule regular health, dental, and eye exams.  Stay current with your immunizations.   Do not use any tobacco products including cigarettes, chewing tobacco, or electronic cigarettes.  If you are pregnant, do not drink alcohol.  If you are breastfeeding, limit how much and how often you drink alcohol.  Limit alcohol intake to no more than 1 drink per day for nonpregnant women. One drink equals 12 ounces of beer, 5 ounces of wine, or 1 ounces of hard liquor.  Do not use street drugs.  Do not share needles.  Ask your health care provider for help if you need support or information about quitting drugs.  Tell your health care provider if you often feel depressed.  Tell your health care provider if you have ever been abused or do not feel safe at home.   This information is not intended to replace advice given to you by your health care provider. Make sure you discuss any questions you have with your health care provider.   Document Released: 10/09/2010 Document Revised: 04/16/2014 Document Reviewed: 02/25/2013 Elsevier Interactive Patient Education 2016 [place] K. Verbena Boeding M.D.

## 2015-02-23 LAB — HEPATITIS C ANTIBODY: HCV Ab: NEGATIVE

## 2015-02-24 LAB — FECAL OCCULT BLOOD, IMMUNOCHEMICAL: Fecal Occult Bld: NEGATIVE

## 2015-02-24 NOTE — Progress Notes (Signed)
Quick Note:  Inform patient stool test negative for blood . Routine follow. ______ 

## 2015-03-07 ENCOUNTER — Encounter: Payer: Self-pay | Admitting: Family Medicine

## 2015-03-14 ENCOUNTER — Other Ambulatory Visit: Payer: Self-pay | Admitting: Internal Medicine

## 2015-03-14 NOTE — Telephone Encounter (Signed)
Pt request refill of the following: telmisartan-hydrochlorothiazide (MICARDIS HCT) 80-25 MG tablet   Phamacy: [facility]

## 2015-03-16 MED ORDER — TELMISARTAN-HCTZ 80-25 MG PO TABS: 1.0000 | ORAL_TABLET | Freq: Every day | ORAL | Status: DC

## 2015-03-16 NOTE — Telephone Encounter (Signed)
Sent to the pharmacy by e-scribe for 90 days. 

## 2015-05-04 ENCOUNTER — Other Ambulatory Visit: Payer: Self-pay | Admitting: Internal Medicine

## 2015-05-04 ENCOUNTER — Telehealth: Payer: Self-pay | Admitting: Family Medicine

## 2015-05-04 NOTE — Telephone Encounter (Signed)
Pt due for follow up with [facility] in February to check BP.  Please help the pt to make this appointment.  Thanks!

## 2015-05-04 NOTE — Telephone Encounter (Signed)
FILLED ON 03/16/15 FOR 3 MONTHS.  REQUEST IS EARLY Pt to follow up in February for bp check.  Message sent to scheduling.

## 2015-05-06 NOTE — Telephone Encounter (Signed)
lmom for pt to call back

## 2015-05-10 NOTE — Telephone Encounter (Signed)
lmom for pt to call back

## 2015-05-13 NOTE — Telephone Encounter (Signed)
lmom for pt to callback and sch an appt °

## 2015-05-24 ENCOUNTER — Ambulatory Visit (INDEPENDENT_AMBULATORY_CARE_PROVIDER_SITE_OTHER): Payer: Medicare Other | Admitting: Internal Medicine

## 2015-05-24 ENCOUNTER — Encounter: Payer: Self-pay | Admitting: Internal Medicine

## 2015-05-24 VITALS — BP 148/94 | Temp 98.4°F | Ht 67.5 in | Wt 243.6 lb

## 2015-05-24 DIAGNOSIS — Z79899 Other long term (current) drug therapy: Secondary | ICD-10-CM | POA: Diagnosis not present

## 2015-05-24 DIAGNOSIS — R7301 Impaired fasting glucose: Secondary | ICD-10-CM

## 2015-05-24 DIAGNOSIS — Z6837 Body mass index (BMI) 37.0-37.9, adult: Secondary | ICD-10-CM | POA: Diagnosis not present

## 2015-05-24 DIAGNOSIS — I10 Essential (primary) hypertension: Secondary | ICD-10-CM | POA: Diagnosis not present

## 2015-05-24 MED ORDER — OLMESARTAN MEDOXOMIL-HCTZ 40-25 MG PO TABS: 1.0000 | ORAL_TABLET | Freq: Every day | ORAL | Status: DC

## 2015-05-24 NOTE — Patient Instructions (Addendum)
I advise  weight watcher s   But takes a while .  To work   In   4-6 weeks send in  A week of readings .  And then we can go from there.  May have to add   Rearrange medication. Add ccb such as amlodipine . Lab Results  Component Value Date   WBC 6.1 02/22/2015   HGB 13.6 02/22/2015   HCT 40.8 02/22/2015   PLT 197.0 02/22/2015   GLUCOSE 111* 02/22/2015   CHOL 211* 02/22/2015   TRIG 105.0 02/22/2015   HDL 77.90 02/22/2015   LDLDIRECT 120.2 09/05/2012   LDLCALC 112* 02/22/2015   ALT 22 02/22/2015   AST 20 02/22/2015   NA 141 02/22/2015   K 4.0 02/22/2015   CL 101 02/22/2015   CREATININE 0.89 02/22/2015   BUN 12 02/22/2015   CO2 32 02/22/2015   TSH 1.34 02/22/2015   HGBA1C 6.1 02/22/2015

## 2015-05-24 NOTE — Progress Notes (Signed)
Pre visit review using our clinic review tool, if applicable. No additional management support is needed unless otherwise documented below in the visit note.  Chief Complaint  Patient presents with  . Follow-up    HPI: Doris Harris  70 y.o. comes in for fu BP    Let pressure medication to telmisartan. With HCTZ. She had done so well on lisinopril with good blood pressure control she is now having a hard time losing weight despite going to the gym at times. Her diastolic blood pressures are in the 90s most days. Prefers to just stay on the fused amount of medicines possible before adding new medicines Asks about thyroid reason it's hard to lose weight. Has been taking measures. ROS: See pertinent positives and negatives per HPI.  Past Medical History  Diagnosis Date  . Hypertension   . Obesity   . IFG (impaired fasting glucose)     x1    Family History  Problem Relation Age of Onset  . Hypertension Mother   . Hypertension Father   . Hypertension Brother   . Diabetes Maternal Grandmother   . Coronary artery disease Other   . Hypertension Other   . Asthma Other   . Hypertension Brother     Social History   Social History  . Marital Status: Single    Spouse Name: N/A  . Number of Children: N/A  . Years of Education: N/A   Social History Main Topics  . Smoking status: Never Smoker   . Smokeless tobacco: Never Used  . Alcohol Use: No  . Drug Use: None  . Sexual Activity: Not Asked   Other Topics Concern  . None   Social History Narrative   Retired 12 13    Exercise  Neg tad    Never smoker   Sleep 8 or more hours   hh of 1  Attends 55+ yo days    No pets           Outpatient Prescriptions Prior to Visit  Medication Sig Dispense Refill  . telmisartan-hydrochlorothiazide (MICARDIS HCT) 80-25 MG tablet Take 1 tablet by mouth daily. 90 tablet 0  . losartan-hydrochlorothiazide (HYZAAR) 100-25 MG per tablet Take 1 tablet by mouth daily. 90 tablet 3   No  facility-administered medications prior to visit.     EXAM:  BP 148/94 mmHg  Temp(Src) 98.4 F (36.9 C) (Oral)  Ht 5' 7.5" (1.715 m)  Wt 243 lb 9.6 oz (110.496 kg)  BMI 37.57 kg/m2  Body mass index is 37.57 kg/(m^2).  GENERAL: vitals reviewed and listed above, alert, oriented, appears well hydrated and in no acute distress  LUNGS: clear to auscultation bilaterally, no wheezes, rales or rhonchi, good air movement CV: HRRR, no clubbing cyanosis or  peripheral edema nl cap refill  MS: moves all extremities without noticeable focal  abnormality PSYCH: pleasant and cooperative, no obvious depression or anxiety BP Readings from Last 3 Encounters:  05/24/15 148/94  02/22/15 146/96  12/22/14 138/90   Wt Readings from Last 3 Encounters:  05/24/15 243 lb 9.6 oz (110.496 kg)  02/22/15 240 lb 3.2 oz (108.954 kg)  12/22/14 238 lb 4.8 oz (108.092 kg)   Lab Results  Component Value Date   WBC 6.1 02/22/2015   HGB 13.6 02/22/2015   HCT 40.8 02/22/2015   PLT 197.0 02/22/2015   GLUCOSE 111* 02/22/2015   CHOL 211* 02/22/2015   TRIG 105.0 02/22/2015   HDL 77.90 02/22/2015   LDLDIRECT 120.2 09/05/2012  LDLCALC 112* 02/22/2015   ALT 22 02/22/2015   AST 20 02/22/2015   NA 141 02/22/2015   K 4.0 02/22/2015   CL 101 02/22/2015   CREATININE 0.89 02/22/2015   BUN 12 02/22/2015   CO2 32 02/22/2015   TSH 1.34 02/22/2015   HGBA1C 6.1 02/22/2015   ASSESSMENT AND PLAN:  Discussed the following assessment and plan:  Essential hypertension  Medication management  Fasting hyperglycemia  BMI 37.0-37.9, adult  Morbid obesity, unspecified obesity type (HCC) Patient reports having done well on Benicar in the remote past but it was branded and became expensive. She is willing to try this again noted is a more potent ARB. We may have to resort to adding a CCB or rearranging medicine. She will contact us in about 4-6 weeks on my chart with her blood pressure readings and then make a plan.  Last full blood work was in November. She has hyperglycemia at risk. Discussed weight eating and she will be consider joining [employer]. -Patient advised to return or notify health care team  if symptoms worsen ,persist or new concerns arise. Total visit > 50% spent counseling and coordinating care as indicated in above note and in instructions to patient .     Patient Instructions   I advise  weight watcher s   But takes a while .  To work   In   4-6 weeks send in  A week of readings .  And then we can go from there.  May have to add   Rearrange medication. Add ccb such as amlodipine . Lab Results  Component Value Date   WBC 6.1 02/22/2015   HGB 13.6 02/22/2015   HCT 40.8 02/22/2015   PLT 197.0 02/22/2015   GLUCOSE 111* 02/22/2015   CHOL 211* 02/22/2015   TRIG 105.0 02/22/2015   HDL 77.90 02/22/2015   LDLDIRECT 120.2 09/05/2012   LDLCALC 112* 02/22/2015   ALT 22 02/22/2015   AST 20 02/22/2015   NA 141 02/22/2015   K 4.0 02/22/2015   CL 101 02/22/2015   CREATININE 0.89 02/22/2015   BUN 12 02/22/2015   CO2 32 02/22/2015   TSH 1.34 02/22/2015   HGBA1C 6.1 02/22/2015      Kameren Baade K. Pahola Dimmitt M.D.

## 2015-05-25 ENCOUNTER — Telehealth: Payer: Self-pay | Admitting: Internal Medicine

## 2015-05-25 ENCOUNTER — Telehealth: Payer: Self-pay | Admitting: *Deleted

## 2015-05-25 NOTE — Telephone Encounter (Signed)
[facility]- faxed a note stating Olmesartan-HCTZ 40-25mg  costs $184.47 on the patient's insurance and she asked for a less expensive alternative.

## 2015-05-25 NOTE — Telephone Encounter (Signed)
Pt can not afford generic benicar cost 184.00. Pt would like to tried something in lisinopril family not lisinopril due to cough. [facility] [address]

## 2015-05-25 NOTE — Telephone Encounter (Signed)
Noted.  See other telephone note.

## 2015-05-30 NOTE — Telephone Encounter (Signed)
All  In the lisinopril family usually causes cough but can try benazapril hctz  20 /25 if generic  Disp 30 refill x 2  Let usk now how doing in 2-3 months If not controlled  add amlodipine  5 mg per day

## 2015-05-30 NOTE — Telephone Encounter (Signed)
Left a message for a return call.

## 2015-05-31 MED ORDER — BENAZEPRIL-HYDROCHLOROTHIAZIDE 20-25 MG PO TABS: 1.0000 | ORAL_TABLET | Freq: Every day | ORAL | Status: DC

## 2015-05-31 NOTE — Addendum Note (Signed)
Addended by: Raj Janus T on: 05/31/2015 03:59 PM   Modules accepted: Orders, Medications

## 2015-05-31 NOTE — Telephone Encounter (Signed)
Spoke to the pt.  Changed to benazepril/hctz.  She will monitor blood pressure and let WP know how she is doing in 2-3 months.  Informed her that if her bp is not controlled at that time than [facility] will add amlodipine.

## 2015-08-12 ENCOUNTER — Other Ambulatory Visit: Payer: Self-pay | Admitting: Internal Medicine

## 2015-08-12 NOTE — Telephone Encounter (Signed)
See below last phone note  How is her  bp doing ? Does she have a cough ?  If ok at goal below 140/90  then can refill for  90 days   [facility]     Misty T Adkins, CMA at 05/31/2015 3:58 PM     Status: Signed       Expand All Collapse All   Spoke to the pt. Changed to benazepril/hctz. She will monitor blood pressure and let WP know how she is doing in 2-3 months. Informed her that if her bp is not controlled at that time [facility]            Nils FlackMisty T Adkins, CMA at 05/30/2015 5:13 PM     Status: Signed       Expand All Collapse All   Left a message for a return call.            Madelin HeadingsWanda K Harlo Fabela, MD at 05/30/2015 4:49 PM     Status: Signed       Expand All Collapse All   All In the lisinopril family usually causes cough but can try benazapril hctz 20 /25 if generic Disp 30 refill x 2  Let usk now how doing in 2-3 months If not controlled add amlodipine 5 mg per day             Leafy RoNorma J Robinson at 05/25/2015 12:12 PM     Status: Signed       Expand All Collapse All   Pt can not afford generic benicar cost 184.00. Pt would like to tried something in lisinopril family not lisinopril due to cough[facility] [address]

## 2015-08-12 NOTE — Telephone Encounter (Signed)
Pt request refill  benazepril-hydrochlorthiazide (LOTENSIN HCT) 20-25 MG tablet  [facility]

## 2015-08-15 MED ORDER — BENAZEPRIL-HYDROCHLOROTHIAZIDE 20-25 MG PO TABS: 1.0000 | ORAL_TABLET | Freq: Every day | ORAL | Status: DC

## 2015-08-15 NOTE — Telephone Encounter (Signed)
Spoke to the pt.  Average bp is 140s/80s.  Currently in weight watchers and has loss 10 pounds.

## 2015-10-03 ENCOUNTER — Other Ambulatory Visit: Payer: Self-pay | Admitting: Internal Medicine

## 2015-10-05 NOTE — Telephone Encounter (Signed)
Pt returned your call.  

## 2015-10-05 NOTE — Telephone Encounter (Signed)
Left a message for a return call.  Need to see how her bp is doing.

## 2015-10-05 NOTE — Telephone Encounter (Signed)
Spoke to the pt.  She stated her bps are averaging 140s/80s.  She is down 14lbs with [employer].  Made a future follow up on 10/24/15.  Filled for 3 month

## 2015-10-23 NOTE — Progress Notes (Signed)
Chief Complaint  Patient presents with  . Follow-up    blood pressure    HPI: Doris Harris 70 y.o.  Fu bp  Getting hihg 130 to 88 and 090s   Doing weight watchers some  and goin exercising gym . No sx  No sig cough throat sx  nhad hoped that  This current ly would have responded like the lisinopril did      ROS: See pertinent positives and negatives per HPI.  Past Medical History  Diagnosis Date  . Hypertension   . Obesity   . IFG (impaired fasting glucose)     x1    Family History  Problem Relation Age of Onset  . Hypertension Mother   . Hypertension Father   . Hypertension Brother   . Diabetes Maternal Grandmother   . Coronary artery disease Other   . Hypertension Other   . Asthma Other   . Hypertension Brother     Social History   Social History  . Marital Status: Single    Spouse Name: N/A  . Number of Children: N/A  . Years of Education: N/A   Social History Main Topics  . Smoking status: Never Smoker   . Smokeless tobacco: Never Used  . Alcohol Use: No  . Drug Use: None  . Sexual Activity: Not Asked   Other Topics Concern  . None   Social History Narrative   Retired 12 13    Exercise  Neg tad    Never smoker   Sleep 8 or more hours   hh of 1  Attends 4+ yo days    No pets           Outpatient Prescriptions Prior to Visit  Medication Sig Dispense Refill  . benazepril-hydrochlorthiazide (LOTENSIN HCT) 20-25 MG tablet Take 1 tablet by mouth  daily 90 tablet 0   No facility-administered medications prior to visit.     EXAM:  BP 160/98 mmHg  Pulse 80  Ht 5' 7.5" (1.715 m)  Wt 228 lb 6 oz (103.59 kg)  BMI 35.22 kg/m2  SpO2 96%  Body mass index is 35.22 kg/(m^2).  GENERAL: vitals reviewed and listed above, alert, oriented, appears well hydrated and in no acute distress HEENT: atraumatic, conjunctiva  clear, no obvious abnormalities on inspection of external nose and ears NECK: no obvious masses on inspection palpation  CV: HRRR,  no clubbing cyanosis or  peripheral edema nl cap refill  MS: moves all extremities without noticeable focal  abnormality PSYCH: pleasant and cooperative, no obvious depression or anxiety Lab Results  Component Value Date   WBC 6.1 02/22/2015   HGB 13.6 02/22/2015   HCT 40.8 02/22/2015   PLT 197.0 02/22/2015   GLUCOSE 111* 02/22/2015   CHOL 211* 02/22/2015   TRIG 105.0 02/22/2015   HDL 77.90 02/22/2015   LDLDIRECT 120.2 09/05/2012   LDLCALC 112* 02/22/2015   ALT 22 02/22/2015   AST 20 02/22/2015   NA 141 02/22/2015   K 4.0 02/22/2015   CL 101 02/22/2015   CREATININE 0.89 02/22/2015   BUN 12 02/22/2015   CO2 32 02/22/2015   TSH 1.34 02/22/2015   HGBA1C 6.1 02/22/2015   BP Readings from Last 3 Encounters:  10/24/15 160/98  05/24/15 148/94  02/22/15 146/96   Wt Readings from Last 3 Encounters:  10/24/15 228 lb 6 oz (103.59 kg)  05/24/15 243 lb 9.6 oz (110.496 kg)  02/22/15 240 lb 3.2 oz (108.954 kg)    ASSESSMENT AND  PLAN:  Discussed the following assessment and plan:  Essential hypertension  Medication management  -Patient advised to return or notify health care team  if symptoms worsen ,persist or new concerns arise.  Patient Instructions  Continue lifestyle intervention healthy eating and exercise . Add amlodipine  5 mg per day   And  If doing ok then we may be able to drop the  benazepril .  Send in readings  In a month     ROV in   cpx in november      Wanda K. Panosh M.D.

## 2015-10-24 ENCOUNTER — Ambulatory Visit (INDEPENDENT_AMBULATORY_CARE_PROVIDER_SITE_OTHER): Payer: Medicare Other | Admitting: Internal Medicine

## 2015-10-24 ENCOUNTER — Encounter: Payer: Self-pay | Admitting: Internal Medicine

## 2015-10-24 VITALS — BP 160/98 | HR 80 | Ht 67.5 in | Wt 228.4 lb

## 2015-10-24 DIAGNOSIS — I10 Essential (primary) hypertension: Secondary | ICD-10-CM | POA: Diagnosis not present

## 2015-10-24 DIAGNOSIS — Z79899 Other long term (current) drug therapy: Secondary | ICD-10-CM | POA: Diagnosis not present

## 2015-10-24 MED ORDER — AMLODIPINE BESYLATE 5 MG PO TABS: 5.0000 mg | ORAL_TABLET | Freq: Every day | ORAL | Status: DC

## 2015-10-24 MED ORDER — BENAZEPRIL-HYDROCHLOROTHIAZIDE 20-25 MG PO TABS: ORAL_TABLET | ORAL | Status: DC

## 2015-10-24 NOTE — Progress Notes (Signed)
Pre visit review using our clinic review tool, if applicable. No additional management support is needed unless otherwise documented below in the visit note. 

## 2015-10-24 NOTE — Patient Instructions (Addendum)
Continue lifestyle intervention healthy eating and exercise . Add amlodipine  5 mg per day   And  If doing ok then we may be able to drop the  benazepril .  Send in readings  In a month     ROV in   cpx in november

## 2015-10-24 NOTE — Assessment & Plan Note (Signed)
Still not at goal  Prefers fewer pills meds  At this time add amlodipine 5 mg  To current regimen and cont lsi . Send in readings in 1 month  Wellness check due in November   May end up stopping the acei

## 2016-01-25 ENCOUNTER — Ambulatory Visit (INDEPENDENT_AMBULATORY_CARE_PROVIDER_SITE_OTHER): Payer: Medicare Other | Admitting: Family Medicine

## 2016-01-25 ENCOUNTER — Encounter: Payer: Self-pay | Admitting: Family Medicine

## 2016-01-25 VITALS — BP 133/83 | HR 73 | Ht 67.75 in | Wt 224.4 lb

## 2016-01-25 DIAGNOSIS — E669 Obesity, unspecified: Secondary | ICD-10-CM

## 2016-01-25 DIAGNOSIS — Z7189 Other specified counseling: Secondary | ICD-10-CM

## 2016-01-25 DIAGNOSIS — E66811 Obesity, class 1: Secondary | ICD-10-CM | POA: Insufficient documentation

## 2016-01-25 DIAGNOSIS — I10 Essential (primary) hypertension: Secondary | ICD-10-CM | POA: Diagnosis not present

## 2016-01-25 DIAGNOSIS — R7303 Prediabetes: Secondary | ICD-10-CM | POA: Diagnosis not present

## 2016-01-25 DIAGNOSIS — E7800 Pure hypercholesterolemia, unspecified: Secondary | ICD-10-CM

## 2016-01-25 NOTE — Patient Instructions (Signed)
Hypertension Hypertension, commonly called high blood pressure, is when the force of blood pumping through your arteries is too strong. Your arteries are the blood vessels that carry blood from your heart throughout your body. A blood pressure reading consists of a higher number over a lower number, such as 110/72. The higher number (systolic) is the pressure inside your arteries when your heart pumps. The lower number (diastolic) is the pressure inside your arteries when your heart relaxes. Ideally you want your blood pressure below 120/80. Hypertension forces your heart to work harder to pump blood. Your arteries may become narrow or stiff. Having untreated or uncontrolled hypertension can cause heart attack, stroke, kidney disease, and other problems. RISK FACTORS Some risk factors for high blood pressure are controllable. Others are not.  Risk factors you cannot control include:   Race. You may be at higher risk if you are African American.  Age. Risk increases with age.  Gender. Men are at higher risk than women before age 45 years. After age 65, women are at higher risk than men. Risk factors you can control include:  Not getting enough exercise or physical activity.  Being overweight.  Getting too much fat, sugar, calories, or salt in your diet.  Drinking too much alcohol. SIGNS AND SYMPTOMS Hypertension does not usually cause signs or symptoms. Extremely high blood pressure (hypertensive crisis) may cause headache, anxiety, shortness of breath, and nosebleed. DIAGNOSIS To check if you have hypertension, your health care provider will measure your blood pressure while you are seated, with your arm held at the level of your heart. It should be measured at least twice using the same arm. Certain conditions can cause a difference in blood pressure between your right and left arms. A blood pressure reading that is higher than normal on one occasion does not mean that you need treatment. If  it is not clear whether you have high blood pressure, you may be asked to return on a different day to have your blood pressure checked again. Or, you may be asked to monitor your blood pressure at home for 1 or more weeks. TREATMENT Treating high blood pressure includes making lifestyle changes and possibly taking medicine. Living a healthy lifestyle can help lower high blood pressure. You may need to change some of your habits. Lifestyle changes may include:  Following the DASH diet. This diet is high in fruits, vegetables, and whole grains. It is low in salt, red meat, and added sugars.  Keep your sodium intake below 2,300 mg per day.  Getting at least 30-45 minutes of aerobic exercise at least 4 times per week.  Losing weight if necessary.  Not smoking.  Limiting alcoholic beverages.  Learning ways to reduce stress. Your health care provider may prescribe medicine if lifestyle changes are not enough to get your blood pressure under control, and if one of the following is true:  You are 18-59 years of age and your systolic blood pressure is above 140.  You are 60 years of age or older, and your systolic blood pressure is above 150.  Your diastolic blood pressure is above 90.  You have diabetes, and your systolic blood pressure is over 140 or your diastolic blood pressure is over 90.  You have kidney disease and your blood pressure is above 140/90.  You have heart disease and your blood pressure is above 140/90. Your personal target blood pressure may vary depending on your medical conditions, your age, and other factors. HOME CARE INSTRUCTIONS    Have your blood pressure rechecked as directed by your health care provider.   Take medicines only as directed by your health care provider. Follow the directions carefully. Blood pressure medicines must be taken as prescribed. The medicine does not work as well when you skip doses. Skipping doses also puts you at risk for  problems.  Do not smoke.   Monitor your blood pressure at home as directed by your health care provider. SEEK MEDICAL CARE IF:   You think you are having a reaction to medicines taken.  You have recurrent headaches or feel dizzy.  You have swelling in your ankles.  You have trouble with your vision. SEEK IMMEDIATE MEDICAL CARE IF:  You develop a severe headache or confusion.  You have unusual weakness, numbness, or feel faint.  You have severe chest or abdominal pain.  You vomit repeatedly.  You have trouble breathing. MAKE SURE YOU:   Understand these instructions.  Will watch your condition.  Will get help right away if you are not doing well or get worse.   This information is not intended to replace advice given to you by your health care provider. Make sure you discuss any questions you have with your health care provider.   Document Released: 03/26/2005 Document Revised: 08/10/2014 Document Reviewed: 01/16/2013 Elsevier Interactive Patient Education 2016 [employer]  

## 2016-01-25 NOTE — Progress Notes (Signed)
New patient office visit note:  Impression and Recommendations:    1. Essential hypertension   2. Obesity, Class I, BMI 30-34.9   3. Prediabetes   4. Elevated LDL cholesterol level   5. Counseling on health promotion and disease prevention      Patient told to come in in the near future, sometime in November, for yearly physical exam.     She will come fasting and at that time we will obtain blood work since she has not had any in over a year.   No medication refills until patient comes in for blood work!    Essential hypertension Patient is at goal with her blood pressure.  Lifestyle changes such as dash diet and engaging in a regular exercise program discussed with patient.  Educational handouts provided  Ambulatory BP monitoring encouraged. Keep log and bring in next OV  Continue current medication(s).   Also, risks and benefits of medications discussed with patient, including alternative treatments.   Encouraged patient to read drug information handouts to further educate self about the medicine prior to starting it.   Contact us prior with any Q's/ concerns.  Obesity, Class I, BMI 30-34.9 Explained to patient what BMI refers to, and what it means medically.    Told patient to think about it as a "medical risk stratification measurement" and how increasing BMI is associated with increasing risk/ or worsening state of various diseases such as hypertension, hyperlipidemia, diabetes, premature OA, depression etc.  American Heart Association guidelines for healthy diet, basically Mediterranean diet, and exercise guidelines of 30 minutes 5 days per week or more discussed in detail.  Health counseling performed.  All questions answered.  Prediabetes - Counseled patient on pathophysiology of disease and discussed various treatment options, which often includes dietary and lifestyle modifications as first line, in addition to discussing the risks and benefits of  various medications..  - Anticipatory guidance given.   - Encouraged to return to clinic or call the office with any further questions or concerns.  Elevated LDL cholesterol level Dietary changes such as low saturated & trans fat and low carb/ ketogenic diets discussed with patient.  Encouraged regular exercise and weight loss when appropriate.   Educational handouts provided at patient's desire.  Contact us prior with any Q's/ concerns.  Counseling on health promotion and disease prevention Patient due for yearly physical in November, 2017     No orders of the defined types were placed in this encounter.   New Prescriptions   No medications on file    Modified Medications   Modified Medication Previous Medication   LOSARTAN-HYDROCHLOROTHIAZIDE (HYZAAR) 100-25 MG TABLET losartan-hydrochlorothiazide (HYZAAR) 100-25 MG tablet      Take 1 tablet by mouth daily.    Take 1 tablet by mouth daily.    Discontinued Medications   AMLODIPINE (NORVASC) 5 MG TABLET    Take 1 tablet (5 mg total) by mouth daily.   BENAZEPRIL-HYDROCHLORTHIAZIDE (LOTENSIN HCT) 20-25 MG TABLET    Take 1 tablet by mouth  daily    Return in about 4 weeks (around 02/22/2016) for For wellness exam & health maintenance evaluation.  The patient was counseled, risk factors were discussed, anticipatory guidance given.  Gross side effects, risk and benefits, and alternatives of medications discussed with patient.  Patient is aware that all medications have potential side effects and we are unable to predict every side effect or drug-drug interaction that may occur.  Expresses verbal understanding  and consents to current therapy plan and treatment regimen.  Please see AVS handed out to patient at the end of our visit for further patient instructions/ counseling done pertaining to today's office visit.    Note: This document was prepared using Dragon voice recognition software and may include unintentional dictation  errors.  ----------------------------------------------------------------------------------------------------------------------    Subjective:    Chief Complaint  Patient presents with  . Establish Care  . Hypertension    HPI: Doris Harris is a pleasant 70 y.o. female who [facility] today to review their medical history with me and establish care.   I asked the patient to review their chronic problem list with me to ensure everything was updated and accurate.     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  1 Son-age 5 and 4 grandchildren.   Patient lives alone at home.  Patient has never smoked, does not drink and does not do drugs.  She bikes or walks 1 hour 3 or more days per week.  Started WW---> at 245lbs     Patient Care Team    Relationship Specialty Notifications Start End  Thomasene Loteborah Naw Lasala, DO PCP - General Family Medicine  01/26/16      Wt Readings from Last 3 Encounters:  01/25/16 224 lb 6.4 oz (101.8 kg)  10/24/15 228 lb 6 oz (103.6 kg)  05/24/15 243 lb 9.6 oz (110.5 kg)   BP Readings from Last 3 Encounters:  01/25/16 133/83  10/24/15 (!) 160/98  05/24/15 (!) 148/94   Pulse Readings from Last 3 Encounters:  01/25/16 73  10/24/15 80  12/22/14 72   BMI Readings from Last 3 Encounters:  01/25/16 34.37 kg/m  10/24/15 35.24 kg/m  05/24/15 37.59 kg/m   Lab Results  Component Value Date   HGBA1C 6.1 02/22/2015   HGBA1C 6.1 09/01/2013   HGBA1C 5.9 09/05/2012    Patient Active Problem List   Diagnosis Date Noted  . Obesity, Class I, BMI 30-34.9 01/25/2016    Priority: High  . Prediabetes 01/25/2016    Priority: High  . Elevated LDL cholesterol level 01/25/2016    Priority: High  . Essential hypertension 12/02/2014    Priority: High  . Screening for colon cancer 09/01/2013  . Counseling on health promotion and disease prevention 09/01/2013  . URINALYSIS, ABNORMAL  02/08/2010     Past Medical History:  Diagnosis Date  . Hypertension   . IFG (impaired fasting glucose)    x1  . Obesity      Past Surgical History:  Procedure Laterality Date  . MM BREAST STEREO BX*L*R/S    . TOTAL ABDOMINAL HYSTERECTOMY  1997   fibroid  . uterine polyps    . WRIST FRACTURE SURGERY  2000     Family History  Problem Relation Age of Onset  . Hypertension Mother   . Hyperlipidemia Mother   . Hypertension Father   . Hypertension Brother   . Diabetes Maternal Grandmother   . Coronary artery disease Other   . Hypertension Other   . Asthma Other   . Hypertension Brother   . Hypertension Sister   . Healthy Son   . Hypertension Sister   . Healthy Sister   . Hypertension Brother   . Healthy Brother   . Healthy Brother   . Healthy Brother   . Heart attack Brother      History  Drug Use No    History  Alcohol Use No    History  Smoking Status  . Never Smoker  Smokeless Tobacco  . Never Used    Patient's Medications  New Prescriptions   No medications on file  Previous Medications   No medications on file  Modified Medications   Modified Medication Previous Medication   LOSARTAN-HYDROCHLOROTHIAZIDE (HYZAAR) 100-25 MG TABLET losartan-hydrochlorothiazide (HYZAAR) 100-25 MG tablet      Take 1 tablet by mouth daily.    Take 1 tablet by mouth daily.  Discontinued Medications   AMLODIPINE (NORVASC) 5 MG TABLET    Take 1 tablet (5 mg total) by mouth daily.   BENAZEPRIL-HYDROCHLORTHIAZIDE (LOTENSIN HCT) 20-25 MG TABLET    Take 1 tablet by mouth  daily    Allergies: Amlodipine; Ibuprofen; Lisinopril; Benazepril; and Valsartan-hydrochlorothiazide  Review of Systems  Constitutional: Negative.  Negative for chills, diaphoresis, fever, malaise/fatigue and weight loss.  HENT: Negative.  Negative for congestion, sore throat and tinnitus.   Eyes: Negative.  Negative for blurred vision, double vision and photophobia.  Respiratory: Negative.   Negative for cough and wheezing.   Cardiovascular: Negative.  Negative for chest pain and palpitations.  Gastrointestinal: Negative.  Negative for blood in stool, diarrhea, nausea and vomiting.  Genitourinary: Negative.  Negative for dysuria, frequency and urgency.  Musculoskeletal: Negative.  Negative for joint pain and myalgias.  Skin: Negative.  Negative for itching and rash.  Neurological: Negative.  Negative for dizziness, focal weakness, weakness and headaches.  Endo/Heme/Allergies: Negative.  Negative for environmental allergies and polydipsia. Does not bruise/bleed easily.  Psychiatric/Behavioral: Negative.  Negative for depression and memory loss. The patient is not nervous/anxious and does not have insomnia.      Objective:   Blood pressure 133/83, pulse 73, height 5' 7.75" (1.721 m), weight 224 lb 6.4 oz (101.8 kg). Body mass index is 34.37 kg/m. General: Well Developed, well nourished, and in no acute distress.  Neuro: Alert and oriented x3, extra-ocular muscles intact, sensation grossly intact.  HEENT: Normocephalic, atraumatic, pupils equal round reactive to light, neck supple Skin: no gross suspicious lesions or rashes  Cardiac: Regular rate and rhythm, no murmurs rubs or gallops.  Respiratory: Essentially clear to auscultation bilaterally. Not using accessory muscles, speaking in full sentences.  Abdominal: Soft, not grossly distended Musculoskeletal: Ambulates w/o diff, FROM * 4 ext.  Vasc: less 2 sec cap RF, warm and pink  Psych:  No HI/SI, judgement and insight good, Euthymic mood. Full Affect.

## 2016-02-03 ENCOUNTER — Telehealth: Payer: Self-pay | Admitting: Family Medicine

## 2016-02-03 NOTE — Telephone Encounter (Signed)
Doris Harris called into the office to get her Hyzaar 100-25 mg tablets called into [facility],  [address], [place].   She stated she talked to you about the prescription.  Please call her at [phone].

## 2016-02-04 ENCOUNTER — Other Ambulatory Visit: Payer: Self-pay | Admitting: Family Medicine

## 2016-02-04 MED ORDER — LOSARTAN POTASSIUM-HCTZ 100-25 MG PO TABS: 1.0000 | ORAL_TABLET | Freq: Every day | ORAL | 0 refills | Status: DC

## 2016-02-12 NOTE — Assessment & Plan Note (Signed)
-   Counseled patient on pathophysiology of disease and discussed various treatment options, which often includes dietary and lifestyle modifications as first line, in addition to discussing the risks and benefits of various medications..  - Anticipatory guidance given.   - Encouraged to return to clinic or call the office with any further questions or concerns. 

## 2016-02-12 NOTE — Assessment & Plan Note (Signed)

## 2016-02-12 NOTE — Assessment & Plan Note (Signed)
Patient due for yearly physical in November, 2017

## 2016-02-12 NOTE — Assessment & Plan Note (Signed)
Patient is at goal with her blood pressure.  Lifestyle changes such as dash diet and engaging in a regular exercise program discussed with patient.  Educational handouts provided  Ambulatory BP monitoring encouraged. Keep log and bring in next OV  Continue current medication(s).   Also, risks and benefits of medications discussed with patient, including alternative treatments.   Encouraged patient to read drug information handouts to further educate self about the medicine prior to starting it.   Contact us prior with any Q's/ concerns.

## 2016-02-12 NOTE — Assessment & Plan Note (Signed)
Dietary changes such as low saturated & trans fat and low carb/ ketogenic diets discussed with patient.  Encouraged regular exercise and weight loss when appropriate.   Educational handouts provided at patient's desire.  Contact us prior with any Q's/ concerns. 

## 2016-02-22 ENCOUNTER — Other Ambulatory Visit: Payer: Medicare Other

## 2016-02-29 ENCOUNTER — Encounter: Payer: Medicare Other | Admitting: Internal Medicine

## 2016-03-08 ENCOUNTER — Encounter: Payer: Medicare Other | Admitting: Family Medicine

## 2016-03-13 ENCOUNTER — Encounter: Payer: Self-pay | Admitting: Family Medicine

## 2016-03-13 ENCOUNTER — Telehealth: Payer: Self-pay | Admitting: Family Medicine

## 2016-03-13 ENCOUNTER — Ambulatory Visit (INDEPENDENT_AMBULATORY_CARE_PROVIDER_SITE_OTHER): Payer: Medicare Other | Admitting: Family Medicine

## 2016-03-13 VITALS — BP 130/81 | HR 92 | Ht 67.75 in | Wt 222.3 lb

## 2016-03-13 DIAGNOSIS — Z7189 Other specified counseling: Secondary | ICD-10-CM | POA: Diagnosis not present

## 2016-03-13 DIAGNOSIS — Z1239 Encounter for other screening for malignant neoplasm of breast: Secondary | ICD-10-CM

## 2016-03-13 DIAGNOSIS — I10 Essential (primary) hypertension: Secondary | ICD-10-CM | POA: Diagnosis not present

## 2016-03-13 DIAGNOSIS — Z0000 Encounter for general adult medical examination without abnormal findings: Secondary | ICD-10-CM

## 2016-03-13 DIAGNOSIS — Z1231 Encounter for screening mammogram for malignant neoplasm of breast: Secondary | ICD-10-CM

## 2016-03-13 DIAGNOSIS — Z1389 Encounter for screening for other disorder: Secondary | ICD-10-CM | POA: Diagnosis not present

## 2016-03-13 MED ORDER — ASPIRIN EC 81 MG PO TBEC: 81.0000 mg | DELAYED_RELEASE_TABLET | Freq: Every day | ORAL | 3 refills | Status: DC

## 2016-03-13 MED ORDER — LOSARTAN POTASSIUM-HCTZ 100-25 MG PO TABS: 1.0000 | ORAL_TABLET | Freq: Every day | ORAL | 1 refills | Status: DC

## 2016-03-13 NOTE — Telephone Encounter (Signed)
Patient would like any additional refills of the losartan to go through [facility]

## 2016-03-13 NOTE — Telephone Encounter (Signed)
Noted in pt's chart.  Pharmacy changed.  Tiajuana Amass. Lanayah Gartley, CMA

## 2016-03-13 NOTE — Progress Notes (Signed)
Impression and Recommendations:    1. Encounter for wellness examination   2. Screening for breast cancer   3. Screening for multiple conditions   4. Counseling on health promotion and disease prevention   5. Hypertension, unspecified type   6. Morbidly obese (HCC)    Instructions: -Recheck patient's blood pressure before leaving the office today, please let me know what it is. Please document.  -Give her cologuard information/ pamphlet today--- patient will call her insurance to check on the cost and let us know.  -Please give patient stool cards for home use 3  -Please go for your mammogram at [facility].   Please see orders section below for further details of actions taken during this office visit.  Gross side effects, risk and benefits, and alternatives of medications discussed with patient.  Patient is aware that all medications have potential side effects and we are unable to predict every side effect or drug-drug interaction that may occur.  Expresses verbal understanding and consents to current therapy plan and treatment regiment.  1) Anticipatory Guidance: Discussed importance of wearing a seatbelt while driving, not texting while driving; sunscreen when outside along with yearly skin surveillance; eating a well balanced and modest diet; physical activity at least 25 minutes per day or 150 min/ week of moderate to intense activity.  2) Immunizations / Screenings / Labs:  All immunizations and screenings that patient agrees to, are up-to-date per recommendations or will be updated today.  Patient understands the needs for q 53mo dental and yearly vision screens which pt will schedule independently. Obtain CBC, CMP, HgA1c, Lipid panel, TSH and vit D when fasting if not already done   3) Weight:   Discussed goal of losing even 5-10% of current body weight which would improve overall feelings of well being and improve objective health data  significantly.   Improve nutrient density of diet through increasing intake of fruits and vegetables and decreasing saturated/trans fats, white flour products and refined sugar products.   F-up preventative CPE in 1 year. F/up sooner for chronic care management as discussed and/or prn.  Please see orders placed and AVS handed out to patient at the end of our visit for further patient instructions/ counseling done pertaining to today's office visit.     Subjective:    Chief Complaint  Patient presents with  . Annual Exam    pt declined Zostavax, Dexa scan, colonoscopy, and PNA 13.   CC: cpe  HPI: Doris Harris is a 70 y.o. female who presents to [facility] [place] today a yearly health maintenance exam.  Health Maintenance Summary Reviewed and updated, unless pt declines services.  Did not take losartan  Aspirin: administering 81 mg daily Colonoscopy:    Last one 2004-- refuses to go for another Tdap: Up to date: Pneumovax/PPSV23:  Up to date Zostavax:    Postponed.- will think about it Tobacco History Reviewed:   Nwever Alcohol:    No concerns, no excessive use Exercise Habits:   Not meeting AHA guidelines STD concerns:   none Drug Use:   None Birth control method:   n/a Menses regular:    Had Hysterectomy- they took out everything and was told she never needed female exams again.  Lumps or breast concerns:      No Mammo-   2008---> pt doesn;t want to go for it Breast Cancer Family History:      No Skin concerns- none,  No  dermatologist in many yrs Bone/ DEXA scan:   Pt declines!!!  Wt Readings from Last 3 Encounters:  03/13/16 222 lb 4.8 oz (100.8 kg)  01/25/16 224 lb 6.4 oz (101.8 kg)  10/24/15 228 lb 6 oz (103.6 kg)   BP Readings from Last 3 Encounters:  03/13/16 130/81  01/25/16 133/83  10/24/15 (!) 160/98   Pulse Readings from Last 3 Encounters:  03/13/16 92  01/25/16 73  10/24/15 80     Past Medical History:  Diagnosis Date  .  Hypertension   . IFG (impaired fasting glucose)    x1  . Obesity       Past Surgical History:  Procedure Laterality Date  . MM BREAST STEREO BX*L*R/S    . TOTAL ABDOMINAL HYSTERECTOMY  1997   fibroid  . uterine polyps    . WRIST FRACTURE SURGERY  2000      Family History  Problem Relation Age of Onset  . Hypertension Mother   . Hyperlipidemia Mother   . Hypertension Father   . Hypertension Brother   . Diabetes Maternal Grandmother   . Coronary artery disease Other   . Hypertension Other   . Asthma Other   . Hypertension Brother   . Hypertension Sister   . Healthy Son   . Hypertension Sister   . Healthy Sister   . Hypertension Brother   . Healthy Brother   . Healthy Brother   . Healthy Brother   . Heart attack Brother       History  Drug Use No  ,   History  Alcohol Use No  ,   History  Smoking Status  . Never Smoker  Smokeless Tobacco  . Never Used  ,   History  Sexual Activity  . Sexual activity: No    No current outpatient prescriptions on file prior to visit.   No current facility-administered medications on file prior to visit.     Allergies: Amlodipine; Ibuprofen; Lisinopril; Benazepril; and Valsartan-hydrochlorothiazide  Review of Systems  Constitutional: Negative for chills and fever.  HENT: Negative for hearing loss.   Eyes: Negative for blurred vision and double vision.  Respiratory: Negative for shortness of breath and wheezing.   Cardiovascular: Negative for chest pain and palpitations.  Gastrointestinal: Negative for diarrhea, nausea and vomiting.  Genitourinary: Negative for hematuria.  Musculoskeletal: Negative for falls.  Skin: Negative for rash.  Neurological: Negative for speech change, focal weakness and weakness.  Psychiatric/Behavioral: Negative for depression, memory loss and suicidal ideas. The patient does not have insomnia.      Objective:    Blood pressure 130/81, pulse 92, height 5' 7.75" (1.721 m),  weight 222 lb 4.8 oz (100.8 kg). Body mass index is 34.05 kg/m. General Appearance:    Alert, cooperative, no distress, appears stated age  Head:    Normocephalic, without obvious abnormality, atraumatic  Eyes:    PERRL, conjunctiva/corneas clear, EOM's intact, fundi    benign, both eyes  Ears:    Normal TM's and external ear canals, both ears  Nose:   Nares normal, septum midline, mucosa normal, no drainage    or sinus tenderness  Throat:   Lips w/o lesion, mucosa moist, and tongue normal; teeth and   gums normal  Neck:   Supple, symmetrical, trachea midline, no adenopathy;    thyroid:  no enlargement/tenderness/nodules; no carotid   bruit or JVD  Back:     Symmetric, no curvature, ROM normal, no CVA tenderness  Lungs:  Clear to auscultation bilaterally, respirations unlabored, no       Wh/ R/ R  Chest Wall:    No tenderness or gross deformity; normal excursion   Heart:    Regular rate and rhythm, S1 and S2 normal, no murmur, rub   or gallop  Breast Exam:    Chaperone in room. No tenderness, masses, or nipple abnormality b/l; no d/c  Abdomen:     Soft, non-tender, bowel sounds active all four quadrants, NO   G/R/R, no masses, no organomegaly  Genitalia:    Deferred   Rectal:    Declined   Extremities:   Extremities normal, atraumatic, no cyanosis or gross edema  Pulses:   2+ and symmetric all extremities  Skin:   Warm, dry, Skin color, texture, turgor normal, no obvious rashes or lesions Psych: No HI/SI, judgement and insight good, Euthymic mood. Full Affect.  Neurologic:   CNII-XII intact, normal strength, sensation and reflexes    Throughout

## 2016-03-13 NOTE — Patient Instructions (Addendum)
Recheck patient's blood pressure before leaving the office today, please let me know what it is. Please document.  Give her cologuard information/ pamphlet today--- patient will call her insurance to check on the cost and let us know.  Please give patient stool cards for home use 3  Please go for your mammogram at [facility].     PLEASE NOTE:  The office will be calling if your labs / recent test results are not within acceptable normal values within one week of them being done.    Furthermore, you'll be able to review all of your results in "My Chart," when they become available; so please sign-up if you have not already done so.   Keep a copy of these results in your personal home file to share with your other physicians as warranted. If you have any further questions or concerns please do not hesitate to contact us.   Thank you and we appreciate you choosing [name] as your primary care provider.   - 'the Team' at [facility], Female  A healthy lifestyle and preventive care can promote health and wellness. Preventive health guidelines for women include the following key practices.   A routine yearly physical is a good way to check with your health care provider about your health and preventive screening. It is a chance to share any concerns and updates on your health and to receive a thorough exam.   Visit your dentist for a routine exam and preventive care every 6 months. Brush your teeth twice a day and floss once a day. Good oral hygiene prevents tooth decay and gum disease.   The frequency of eye exams is based on your age, health, family medical history, use of contact lenses, and other factors. Follow your health care provider's recommendations for frequency of eye exams.   Eat a healthy diet. Foods like vegetables, fruits, whole grains, low-fat dairy products, and lean protein foods contain the nutrients  you need without too many calories. Decrease your intake of foods high in solid fats, added sugars, and salt. Eat the right amount of calories for you.Get information about a proper diet from your health care provider, if necessary.   Regular physical exercise is one of the most important things you can do for your health. Most adults should get at least 150 minutes of moderate-intensity exercise (any activity that increases your heart rate and causes you to sweat) each week. In addition, most adults need muscle-strengthening exercises on 2 or more days a week.   Maintain a healthy weight. The body mass index (BMI) is a screening tool to identify possible weight problems. It provides an estimate of body fat based on height and weight. Your health care provider can find your BMI, and can help you achieve or maintain a healthy weight.For adults 20 years and older:   - A BMI below 18.5 is considered underweight.   - A BMI of 18.5 to 24.9 is normal.   - A BMI of 25 to 29.9 is considered overweight.   - A BMI of 30 and above is considered obese.   Maintain normal blood lipids and cholesterol levels by exercising and minimizing your intake of trans and saturated fats.  Eat a balanced diet with plenty of fruit and vegetables. Blood tests for lipids and cholesterol should begin at age 71 and be repeated every 5 years minimum.  If your lipid or cholesterol  levels are high, you are over 40, or you are at high risk for heart disease, you may need your cholesterol levels checked more frequently.Ongoing high lipid and cholesterol levels should be treated with medicines if diet and exercise are not working.   If you smoke, find out from your health care provider how to quit. If you do not use tobacco, do not start.   Lung cancer screening is recommended for adults aged 55-80 years who are at high risk for developing lung cancer because of a history of smoking. A yearly low-dose CT scan of the lungs is  recommended for people who have at least a 30-pack-year history of smoking and are a current smoker or have quit within the past 15 years. A pack year of smoking is smoking an average of 1 pack of cigarettes a day for 1 year (for example: 1 pack a day for 30 years or 2 packs a day for 15 years). Yearly screening should continue until the smoker has stopped smoking for at least 15 years. Yearly screening should be stopped for people who develop a health problem that would prevent them from having lung cancer treatment.   If you are pregnant, do not drink alcohol. If you are breastfeeding, be very cautious about drinking alcohol. If you are not pregnant and choose to drink alcohol, do not have more than 1 drink per day. One drink is considered to be 12 ounces (355 mL) of beer, 5 ounces (148 mL) of wine, or 1.5 ounces (44 mL) of liquor.   Avoid use of street drugs. Do not share needles with anyone. Ask for help if you need support or instructions about stopping the use of drugs.   High blood pressure causes heart disease and increases the risk of stroke. Your blood pressure should be checked at least yearly.  Ongoing high blood pressure should be treated with medicines if weight loss and exercise do not work.   If you are 52-64 years old, ask your health care provider if you should take aspirin to prevent strokes.   Diabetes screening involves taking a blood sample to check your fasting blood sugar level. This should be done once every 3 years, after age 58, if you are within normal weight and without risk factors for diabetes. Testing should be considered at a younger age or be carried out more frequently if you are overweight and have at least 1 risk factor for diabetes.   Breast cancer screening is essential preventive care for women. You should practice "breast self-awareness."  This means understanding the normal appearance and feel of your breasts and may include breast self-examination.  Any  changes detected, no matter how small, should be reported to a health care provider.  Women in their 87s and 30s should have a clinical breast exam (CBE) by a health care provider as part of a regular health exam every 1 to 3 years.  After age 12, women should have a CBE every year.  Starting at age 23, women should consider having a mammogram (breast X-ray test) every year.  Women who have a family history of breast cancer should talk to their health care provider about genetic screening.  Women at a high risk of breast cancer should talk to their health care providers about having an MRI and a mammogram every year.   -Breast cancer gene (BRCA)-related cancer risk assessment is recommended for women who have family members with BRCA-related cancers. BRCA-related cancers include breast, ovarian, tubal, and  peritoneal cancers. Having family members with these cancers may be associated with an increased risk for harmful changes (mutations) in the breast cancer genes BRCA1 and BRCA2. Results of the assessment will determine the need for genetic counseling and BRCA1 and BRCA2 testing.   The Pap test is a screening test for cervical cancer. A Pap test can show cell changes on the cervix that might become cervical cancer if left untreated. A Pap test is a procedure in which cells are obtained and examined from the lower end of the uterus (cervix).   - Women should have a Pap test starting at age 53.   - Between ages 22 and 8, Pap tests should be repeated every 2 years.   - Beginning at age 67, you should have a Pap test every 3 years as long as the past 3 Pap tests have been normal.   - Some women have medical problems that increase the chance of getting cervical cancer. Talk to your health care provider about these problems. It is especially important to talk to your health care provider if a new problem develops soon after your last Pap test. In these cases, your health care provider may recommend more  frequent screening and Pap tests.   - The above recommendations are the same for women who have or have not gotten the vaccine for human papillomavirus (HPV).   - If you had a hysterectomy for a problem that was not cancer or a condition that could lead to cancer, then you no longer need Pap tests. Even if you no longer need a Pap test, a regular exam is a good idea to make sure no other problems are starting.   - If you are between ages 39 and 30 years, and you have had normal Pap tests going back 10 years, you no longer need Pap tests. Even if you no longer need a Pap test, a regular exam is a good idea to make sure no other problems are starting.   - If you have had past treatment for cervical cancer or a condition that could lead to cancer, you need Pap tests and screening for cancer for at least 20 years after your treatment.   - If Pap tests have been discontinued, risk factors (such as a new sexual partner) need to be reassessed to determine if screening should be resumed.   - The HPV test is an additional test that may be used for cervical cancer screening. The HPV test looks for the virus that can cause the cell changes on the cervix. The cells collected during the Pap test can be tested for HPV. The HPV test could be used to screen women aged 42 years and older, and should be used in women of any age who have unclear Pap test results. After the age of 30, women should have HPV testing at the same frequency as a Pap test.   Colorectal cancer can be detected and often prevented. Most routine colorectal cancer screening begins at the age of 35 years and continues through age 27 years. However, your health care provider may recommend screening at an earlier age if you have risk factors for colon cancer. On a yearly basis, your health care provider may provide home test kits to check for hidden blood in the stool.  Use of a small camera at the end of a tube, to directly examine the colon  (sigmoidoscopy or colonoscopy), can detect the earliest forms of colorectal cancer. Talk to  your health care provider about this at age 49, when routine screening begins. Direct exam of the colon should be repeated every 5 -10 years through age 27 years, unless early forms of pre-cancerous polyps or small growths are found.   People who are at an increased risk for hepatitis B should be screened for this virus. You are considered at high risk for hepatitis B if:  -You were born in a country where hepatitis B occurs often. Talk with your health care provider about which countries are considered high risk.  - Your parents were born in a high-risk country and you have not received a shot to protect against hepatitis B (hepatitis B vaccine).  - You have HIV or AIDS.  - You use needles to inject street drugs.  - You live with, or have sex with, someone who has Hepatitis B.  - You get hemodialysis treatment.  - You take certain medicines for conditions like cancer, organ transplantation, and autoimmune conditions.   Hepatitis C blood testing is recommended for all people born from [birth year] through 1965 and any individual with known risks for hepatitis C.   Practice safe sex. Use condoms and avoid high-risk sexual practices to reduce the spread of sexually transmitted infections (STIs). STIs include gonorrhea, chlamydia, syphilis, trichomonas, herpes, HPV, and human immunodeficiency virus (HIV). Herpes, HIV, and HPV are viral illnesses that have no cure. They can result in disability, cancer, and death. Sexually active women aged 68 years and younger should be checked for chlamydia. Older women with new or multiple partners should also be tested for chlamydia. Testing for other STIs is recommended if you are sexually active and at increased risk.   Osteoporosis is a disease in which the bones lose minerals and strength with aging. This can result in serious bone fractures or breaks. The risk of osteoporosis  can be identified using a bone density scan. Women ages 43 years and over and women at risk for fractures or osteoporosis should discuss screening with their health care providers. Ask your health care provider whether you should take a calcium supplement or vitamin D to There are also several preventive steps women can take to avoid osteoporosis and resulting fractures or to keep osteoporosis from worsening. -->Recommendations include:  Eat a balanced diet high in fruits, vegetables, calcium, and vitamins.  Get enough calcium. The recommended total intake of is 1,200 mg daily; for best absorption, if taking supplements, divide doses into 250-500 mg doses throughout the day. Of the two types of calcium, calcium carbonate is best absorbed when taken with food but calcium citrate can be taken on an empty stomach.  Get enough vitamin D. NAMS and the [place] recommend at least 1,000 IU per day for women age 58 and over who are at risk of vitamin D deficiency. Vitamin D deficiency can be caused by inadequate sun exposure (for example, those who live in [place]).  Avoid alcohol and smoking. Heavy alcohol intake (more than 7 drinks per week) increases the risk of falls and hip fracture and women smokers tend to lose bone more rapidly and have lower bone mass than nonsmokers. Stopping smoking is one of the most important changes women can make to improve their health and decrease risk for disease.  Be physically active every day. Weight-bearing exercise (for example, fast walking, hiking, jogging, and weight training) may strengthen bones or slow the rate of bone loss that comes with aging. Balancing and muscle-strengthening exercises can reduce the risk  of falling and fracture.  Consider therapeutic medications. Currently, several types of effective drugs are available. Healthcare providers can recommend the type most appropriate for each woman.  Eliminate environmental  factors that may contribute to accidents. Falls cause nearly 90% of all osteoporotic fractures, so reducing this risk is an important bone-health strategy. Measures include ample lighting, removing obstructions to walking, using nonskid rugs on floors, and placing mats and/or grab bars in showers.  Be aware of medication side effects. Some common medicines make bones weaker. These include a type of steroid drug called glucocorticoids used for arthritis and asthma, some antiseizure drugs, certain sleeping pills, treatments for endometriosis, and some cancer drugs. An overactive thyroid gland or using too much thyroid hormone for an underactive thyroid can also be a problem. If you are taking these medicines, talk to your doctor about what you can do to help protect your bones.reduce the rate of osteoporosis.    Menopause can be associated with physical symptoms and risks. Hormone replacement therapy is available to decrease symptoms and risks. You should talk to your health care provider about whether hormone replacement therapy is right for you.   Use sunscreen. Apply sunscreen liberally and repeatedly throughout the day. You should seek shade when your shadow is shorter than you. Protect yourself by wearing long sleeves, pants, a wide-brimmed hat, and sunglasses year round, whenever you are outdoors.   Once a month, do a whole body skin exam, using a mirror to look at the skin on your back. Tell your health care provider of new moles, moles that have irregular borders, moles that are larger than a pencil eraser, or moles that have changed in shape or color.   -Stay current with required vaccines (immunizations).   Influenza vaccine. All adults should be immunized every year.  Tetanus, diphtheria, and acellular pertussis (Td, Tdap) vaccine. Pregnant women should receive 1 dose of Tdap vaccine during each pregnancy. The dose should be obtained regardless of the length of time since the last dose.  Immunization is preferred during the 27th 36th week of gestation. An adult who has not previously received Tdap or who does not know her vaccine status should receive 1 dose of Tdap. This initial dose should be followed by tetanus and diphtheria toxoids (Td) booster doses every 10 years. Adults with an unknown or incomplete history of completing a 3-dose immunization series with Td-containing vaccines should begin or complete a primary immunization series including a Tdap dose. Adults should receive a Td booster every 10 years.  Varicella vaccine. An adult without evidence of immunity to varicella should receive 2 doses or a second dose if she has previously received 1 dose. Pregnant females who do not have evidence of immunity should receive the first dose after pregnancy. This first dose should be obtained before leaving the health care facility. The second dose should be obtained 4 8 weeks after the first dose.  Human papillomavirus (HPV) vaccine. Females aged 60 26 years who have not received the vaccine previously should obtain the 3-dose series. The vaccine is not recommended for use in pregnant females. However, pregnancy testing is not needed before receiving a dose. If a female is found to be pregnant after receiving a dose, no treatment is needed. In that case, the remaining doses should be delayed until after the pregnancy. Immunization is recommended for any person with an immunocompromised condition through the age of 67 years if she did not get any or all doses earlier. During the 3-dose series,  the second dose should be obtained 4 8 weeks after the first dose. The third dose should be obtained 24 weeks after the first dose and 16 weeks after the second dose.  Zoster vaccine. One dose is recommended for adults aged 69 years or older unless certain conditions are present.  Measles, mumps, and rubella (MMR) vaccine. Adults born before [birth year] generally are considered immune to measles and mumps.  Adults born in [birth year] or later should have 1 or more doses of MMR vaccine unless there is a contraindication to the vaccine or there is laboratory evidence of immunity to each of the three diseases. A routine second dose of MMR vaccine should be obtained at least 28 days after the first dose for students attending postsecondary schools, health care workers, or international travelers. People who received inactivated measles vaccine or an unknown type of measles vaccine during 1963 1967 should receive 2 doses of MMR vaccine. People who received inactivated mumps vaccine or an unknown type of mumps vaccine before 1979 and are at high risk for mumps infection should consider immunization with 2 doses of MMR vaccine. For females of childbearing age, rubella immunity should be determined. If there is no evidence of immunity, females who are not pregnant should be vaccinated. If there is no evidence of immunity, females who are pregnant should delay immunization until after pregnancy. Unvaccinated health care workers born before [birth year] who lack laboratory evidence of measles, mumps, or rubella immunity or laboratory confirmation of disease should consider measles and mumps immunization with 2 doses of MMR vaccine or rubella immunization with 1 dose of MMR vaccine.  Pneumococcal 13-valent conjugate (PCV13) vaccine. When indicated, a person who is uncertain of her immunization history and has no record of immunization should receive the PCV13 vaccine. An adult aged 76 years or older who has certain medical conditions and has not been previously immunized should receive 1 dose of PCV13 vaccine. This PCV13 should be followed with a dose of pneumococcal polysaccharide (PPSV23) vaccine. The PPSV23 vaccine dose should be obtained at least 8 weeks after the dose of PCV13 vaccine. An adult aged 32 years or older who has certain medical conditions and previously received 1 or more doses of PPSV23 vaccine should receive 1 dose of  PCV13. The PCV13 vaccine dose should be obtained 1 or more years after the last PPSV23 vaccine dose.  Pneumococcal polysaccharide (PPSV23) vaccine. When PCV13 is also indicated, PCV13 should be obtained first. All adults aged 47 years and older should be immunized. An adult younger than age 15 years who has certain medical conditions should be immunized. Any person who resides in a nursing home or long-term care facility should be immunized. An adult smoker should be immunized. People with an immunocompromised condition and certain other conditions should receive both PCV13 and PPSV23 vaccines. People with human immunodeficiency virus (HIV) infection should be immunized as soon as possible after diagnosis. Immunization during chemotherapy or radiation therapy should be avoided. Routine use of PPSV23 vaccine is not recommended for American Indians, [place] Natives, or people younger than 65 years unless there are medical conditions that require PPSV23 vaccine. When indicated, people who have unknown immunization and have no record of immunization should receive PPSV23 vaccine. One-time revaccination 5 years after the first dose of PPSV23 is recommended for people aged 43 64 years who have chronic kidney failure, nephrotic syndrome, asplenia, or immunocompromised conditions. People who received 1 2 doses of PPSV23 before age 76 years should receive another dose of PPSV23 vaccine  at age 76 years or later if at least 5 years have passed since the previous dose. Doses of PPSV23 are not needed for people immunized with PPSV23 at or after age 41 years.  Meningococcal vaccine. Adults with asplenia or persistent complement component deficiencies should receive 2 doses of quadrivalent meningococcal conjugate (MenACWY-D) vaccine. The doses should be obtained at least 2 months apart. Microbiologists working with certain meningococcal bacteria, military recruits, people at risk during an outbreak, and people who travel to or  live in countries with a high rate of meningitis should be immunized. A first-year college student up through age 34 years who is living in a residence hall should receive a dose if she did not receive a dose on or after her 16th birthday. Adults who have certain high-risk conditions should receive one or more doses of vaccine.  Hepatitis A vaccine. Adults who wish to be protected from this disease, have certain high-risk conditions, work with hepatitis A-infected animals, work in hepatitis A research labs, or travel to or work in countries with a high rate of hepatitis A should be immunized. Adults who were previously unvaccinated and who anticipate close contact with an international adoptee during the first 60 days after arrival in the [place] from a country with a high rate of hepatitis A should be immunized.  Hepatitis B vaccine.  Adults who wish to be protected from this disease, have certain high-risk conditions, may be exposed to blood or other infectious body fluids, are household contacts or sex partners of hepatitis B positive people, are clients or workers in certain care facilities, or travel to or work in countries with a high rate of hepatitis B should be immunized.  Haemophilus influenzae type b (Hib) vaccine. A previously unvaccinated person with asplenia or sickle cell disease or having a scheduled splenectomy should receive 1 dose of Hib vaccine. Regardless of previous immunization, a recipient of a hematopoietic stem cell transplant should receive a 3-dose series 6 12 months after her successful transplant. Hib vaccine is not recommended for adults with HIV infection.  Preventive Services / Frequency Ages 68 to 39years  Blood pressure check.** / Every 1 to 2 years.  Lipid and cholesterol check.** / Every 5 years beginning at age 68.  Clinical breast exam.** / Every 3 years for women in their 104s and 30s.  BRCA-related cancer risk assessment.** / For women who have family  members with a BRCA-related cancer (breast, ovarian, tubal, or peritoneal cancers).  Pap test.** / Every 2 years from ages 36 through 47. Every 3 years starting at age 2 through age 65 or 65 with a history of 3 consecutive normal Pap tests.  HPV screening.** / Every 3 years from ages 33 through ages 22 to 31 with a history of 3 consecutive normal Pap tests.  Hepatitis C blood test.** / For any individual with known risks for hepatitis C.  Skin self-exam. / Monthly.  Influenza vaccine. / Every year.  Tetanus, diphtheria, and acellular pertussis (Tdap, Td) vaccine.** / Consult your health care provider. Pregnant women should receive 1 dose of Tdap vaccine during each pregnancy. 1 dose of Td every 10 years.  Varicella vaccine.** / Consult your health care provider. Pregnant females who do not have evidence of immunity should receive the first dose after pregnancy.  HPV vaccine. / 3 doses over 6 months, if 26 and younger. The vaccine is not recommended for use in pregnant females. However, pregnancy testing is not needed before receiving a dose.  Measles, mumps, rubella (MMR) vaccine.** / You need at least 1 dose of MMR if you were born in 1957 or later. You may also need a 2nd dose. For females of childbearing age, rubella immunity should be determined. If there is no evidence of immunity, females who are not pregnant should be vaccinated. If there is no evidence of immunity, females who are pregnant should delay immunization until after pregnancy.  Pneumococcal 13-valent conjugate (PCV13) vaccine.** / Consult your health care provider.  Pneumococcal polysaccharide (PPSV23) vaccine.** / 1 to 2 doses if you smoke cigarettes or if you have certain conditions.  Meningococcal vaccine.** / 1 dose if you are age 1 to 78 years and a Market researcher living in a residence hall, or have one of several medical conditions, you need to get vaccinated against meningococcal disease. You may also  need additional booster doses.  Hepatitis A vaccine.** / Consult your health care provider.  Hepatitis B vaccine.** / Consult your health care provider.  Haemophilus influenzae type b (Hib) vaccine.** / Consult your health care provider.  Ages 78 to 64years  Blood pressure check.** / Every 1 to 2 years.  Lipid and cholesterol check.** / Every 5 years beginning at age 33 years.  Lung cancer screening. / Every year if you are aged 59 80 years and have a 30-pack-year history of smoking and currently smoke or have quit within the past 15 years. Yearly screening is stopped once you have quit smoking for at least 15 years or develop a health problem that would prevent you from having lung cancer treatment.  Clinical breast exam.** / Every year after age 11 years.  BRCA-related cancer risk assessment.** / For women who have family members with a BRCA-related cancer (breast, ovarian, tubal, or peritoneal cancers).  Mammogram.** / Every year beginning at age 41 years and continuing for as long as you are in good health. Consult with your health care provider.  Pap test.** / Every 3 years starting at age 44 years through age 38 or 40 years with a history of 3 consecutive normal Pap tests.  HPV screening.** / Every 3 years from ages 30 years through ages 55 to 80 years with a history of 3 consecutive normal Pap tests.  Fecal occult blood test (FOBT) of stool. / Every year beginning at age 65 years and continuing until age 54 years. You may not need to do this test if you get a colonoscopy every 10 years.  Flexible sigmoidoscopy or colonoscopy.** / Every 5 years for a flexible sigmoidoscopy or every 10 years for a colonoscopy beginning at age 51 years and continuing until age 10 years.  Hepatitis C blood test.** / For all people born from [birth year] through 1964 and any individual with known risks for hepatitis C.  Skin self-exam. / Monthly.  Influenza vaccine. / Every year.  Tetanus, diphtheria,  and acellular pertussis (Tdap/Td) vaccine.** / Consult your health care provider. Pregnant women should receive 1 dose of Tdap vaccine during each pregnancy. 1 dose of Td every 10 years.  Varicella vaccine.** / Consult your health care provider. Pregnant females who do not have evidence of immunity should receive the first dose after pregnancy.  Zoster vaccine.** / 1 dose for adults aged 56 years or older.  Measles, mumps, rubella (MMR) vaccine.** / You need at least 1 dose of MMR if you were born in 1957 or later. You may also need a 2nd dose. For females of childbearing age, rubella immunity should be determined.  If there is no evidence of immunity, females who are not pregnant should be vaccinated. If there is no evidence of immunity, females who are pregnant should delay immunization until after pregnancy.  Pneumococcal 13-valent conjugate (PCV13) vaccine.** / Consult your health care provider.  Pneumococcal polysaccharide (PPSV23) vaccine.** / 1 to 2 doses if you smoke cigarettes or if you have certain conditions.  Meningococcal vaccine.** / Consult your health care provider.  Hepatitis A vaccine.** / Consult your health care provider.  Hepatitis B vaccine.** / Consult your health care provider.  Haemophilus influenzae type b (Hib) vaccine.** / Consult your health care provider.  Ages 39 years and over  Blood pressure check.** / Every 1 to 2 years.  Lipid and cholesterol check.** / Every 5 years beginning at age 43 years.  Lung cancer screening. / Every year if you are aged 70 80 years and have a 30-pack-year history of smoking and currently smoke or have quit within the past 15 years. Yearly screening is stopped once you have quit smoking for at least 15 years or develop a health problem that would prevent you from having lung cancer treatment.  Clinical breast exam.** / Every year after age 58 years.  BRCA-related cancer risk assessment.** / For women who have family members  with a BRCA-related cancer (breast, ovarian, tubal, or peritoneal cancers).  Mammogram.** / Every year beginning at age 23 years and continuing for as long as you are in good health. Consult with your health care provider.  Pap test.** / Every 3 years starting at age 73 years through age 5 or 100 years with 3 consecutive normal Pap tests. Testing can be stopped between 65 and 70 years with 3 consecutive normal Pap tests and no abnormal Pap or HPV tests in the past 10 years.  HPV screening.** / Every 3 years from ages 62 years through ages 49 or 9 years with a history of 3 consecutive normal Pap tests. Testing can be stopped between 65 and 70 years with 3 consecutive normal Pap tests and no abnormal Pap or HPV tests in the past 10 years.  Fecal occult blood test (FOBT) of stool. / Every year beginning at age 62 years and continuing until age 14 years. You may not need to do this test if you get a colonoscopy every 10 years.  Flexible sigmoidoscopy or colonoscopy.** / Every 5 years for a flexible sigmoidoscopy or every 10 years for a colonoscopy beginning at age 38 years and continuing until age 41 years.  Hepatitis C blood test.** / For all people born from [birth year] through 1964 and any individual with known risks for hepatitis C.  Osteoporosis screening.** / A one-time screening for women ages 44 years and over and women at risk for fractures or osteoporosis.  Skin self-exam. / Monthly.  Influenza vaccine. / Every year.  Tetanus, diphtheria, and acellular pertussis (Tdap/Td) vaccine.** / 1 dose of Td every 10 years.  Varicella vaccine.** / Consult your health care provider.  Zoster vaccine.** / 1 dose for adults aged 73 years or older.  Pneumococcal 13-valent conjugate (PCV13) vaccine.** / Consult your health care provider.  Pneumococcal polysaccharide (PPSV23) vaccine.** / 1 dose for all adults aged 32 years and older.  Meningococcal vaccine.** / Consult your health care  provider.  Hepatitis A vaccine.** / Consult your health care provider.  Hepatitis B vaccine.** / Consult your health care provider.  Haemophilus influenzae type b (Hib) vaccine.** / Consult your health care provider. ** Family history and personal  history of risk and conditions may change your health care provider's recommendations. Document Released: 05/22/2001 Document Revised: 01/14/2013  [facility] Patient Information 2014 [place], [place].   EXERCISE AND DIET:  We recommended that you start or continue a regular exercise program for good health. Regular exercise means any activity that makes your heart beat faster and makes you sweat.  We recommend exercising at least 30 minutes per day at least 3 days a week, preferably 5.  We also recommend a diet low in fat and sugar / carbohydrates.  Inactivity, poor dietary choices and obesity can cause diabetes, heart attack, stroke, and kidney damage, among others.     ALCOHOL AND SMOKING:  Women should limit their alcohol intake to no more than 7 drinks/beers/glasses of wine (combined, not each!) per week. Moderation of alcohol intake to this level decreases your risk of breast cancer and liver damage.  ( And of course, no recreational drugs are part of a healthy lifestyle.)  Also, you should not be smoking at all or even being exposed to second hand smoke. Most people know smoking can cause cancer, and various heart and lung diseases, but did you know it also contributes to weakening of your bones?  Aging of your skin?  Yellowing of your teeth and nails?   CALCIUM AND VITAMIN D:  Adequate intake of calcium and Vitamin D are recommended.  The recommendations for exact amounts of these supplements seem to change often, but generally speaking 600 mg of calcium (either carbonate or citrate) and 800 units of Vitamin D per day seems prudent. Certain women may benefit from higher intake of Vitamin D.  If you are among these women, your doctor will have told  you during your visit.     PAP SMEARS:  Pap smears, to check for cervical cancer or precancers,  have traditionally been done yearly, although recent scientific advances have shown that most women can have pap smears less often.  However, every woman still should have a physical exam from her gynecologist or primary care physician every year. It will include a breast check, inspection of the vulva and vagina to check for abnormal growths or skin changes, a visual exam of the cervix, and then an exam to evaluate the size and shape of the uterus and ovaries.  And after 69 years of age, a rectal exam is indicated to check for rectal cancers. We will also provide age appropriate advice regarding health maintenance, like when you should have certain vaccines, screening for sexually transmitted diseases, bone density testing, colonoscopy, mammograms, etc.    MAMMOGRAMS:  All women over 65 years old should have a yearly mammogram. Many facilities now offer a "3D" mammogram, which may cost around $50 extra out of pocket. If possible,  we recommend you accept the option to have the 3D mammogram performed.  It both reduces the number of women who will be called back for extra views which then turn out to be normal, and it is better than the routine mammogram at detecting truly abnormal areas.     COLONOSCOPY:  Colonoscopy to screen for colon cancer is recommended for all women at age 12.  We know, you hate the idea of the prep.  We agree, BUT, having colon cancer and not knowing it is worse!!  Colon cancer so often starts as a polyp that can be seen and removed at colonscopy, which can quite literally save your life!  And if your first colonoscopy is normal and you have  no family history of colon cancer, most women don't have to have it again for 10 years.  Once every ten years, you can do something that may end up saving your life, right?  We will be happy to help you get it scheduled when you are ready.  Be sure  to check your insurance coverage so you understand how much it will cost.  It may be covered as a preventative service at no cost, but you should check your particular policy.

## 2016-03-14 LAB — LIPID PANEL
Cholesterol: 223 mg/dL — ABNORMAL HIGH (ref <200)
HDL: 78 mg/dL (ref >50)
LDL CALC: 129 mg/dL — AB (ref <100)
Total CHOL/HDL Ratio: 2.9 Ratio (ref <5.0)
Triglycerides: 81 mg/dL (ref <150)
VLDL: 16 mg/dL (ref <30)

## 2016-03-14 LAB — CBC WITH DIFFERENTIAL/PLATELET
BASOS ABS: 51 {cells}/uL (ref 0–200)
Basophils Relative: 1 %
EOS PCT: 2 %
Eosinophils Absolute: 102 cells/uL (ref 15–500)
HCT: 39.6 % (ref 35.0–45.0)
HEMOGLOBIN: 13 g/dL (ref 11.7–15.5)
LYMPHS PCT: 38 %
Lymphs Abs: 1938 cells/uL (ref 850–3900)
MCH: 30.4 pg (ref 27.0–33.0)
MCHC: 32.8 g/dL (ref 32.0–36.0)
MCV: 92.5 fL (ref 80.0–100.0)
MPV: 11.6 fL (ref 7.5–12.5)
Monocytes Absolute: 357 cells/uL (ref 200–950)
Monocytes Relative: 7 %
Neutro Abs: 2652 cells/uL (ref 1500–7800)
Neutrophils Relative %: 52 %
Platelets: 193 10*3/uL (ref 140–400)
RBC: 4.28 MIL/uL (ref 3.80–5.10)
RDW: 13.8 % (ref 11.0–15.0)
WBC: 5.1 10*3/uL (ref 3.8–10.8)

## 2016-03-14 LAB — HEMOGLOBIN A1C
HEMOGLOBIN A1C: 5.6 % (ref <5.7)
Mean Plasma Glucose: 114 mg/dL

## 2016-03-14 LAB — COMPLETE METABOLIC PANEL WITH GFR
ALBUMIN: 3.9 g/dL (ref 3.6–5.1)
ALK PHOS: 70 U/L (ref 33–130)
ALT: 16 U/L (ref 6–29)
AST: 19 U/L (ref 10–35)
BILIRUBIN TOTAL: 0.7 mg/dL (ref 0.2–1.2)
BUN: 17 mg/dL (ref 7–25)
CO2: 28 mmol/L (ref 20–31)
Calcium: 9.2 mg/dL (ref 8.6–10.4)
Chloride: 102 mmol/L (ref 98–110)
Creat: 0.91 mg/dL (ref 0.50–0.99)
GFR, Est African American: 74 mL/min (ref >=60)
GFR, Est Non African American: 65 mL/min (ref >=60)
GLUCOSE: 100 mg/dL — AB (ref 65–99)
Potassium: 3.8 mmol/L (ref 3.5–5.3)
SODIUM: 139 mmol/L (ref 135–146)
TOTAL PROTEIN: 7.5 g/dL (ref 6.1–8.1)

## 2016-03-14 LAB — T4, FREE: FREE T4: 1.2 ng/dL (ref 0.8–1.8)

## 2016-03-14 LAB — TSH: TSH: 1.19 m[IU]/L

## 2016-03-14 LAB — VITAMIN D 25 HYDROXY (VIT D DEFICIENCY, FRACTURES): Vit D, 25-Hydroxy: 38 ng/mL (ref 30–100)

## 2016-03-14 LAB — VITAMIN B12: VITAMIN B 12: 703 pg/mL (ref 200–1100)

## 2016-03-19 ENCOUNTER — Telehealth: Payer: Self-pay | Admitting: Family Medicine

## 2016-04-12 NOTE — Telephone Encounter (Signed)
Opened in error

## 2016-08-31 ENCOUNTER — Other Ambulatory Visit: Payer: Self-pay | Admitting: Family Medicine

## 2016-09-29 ENCOUNTER — Other Ambulatory Visit: Payer: Self-pay | Admitting: Family Medicine

## 2016-10-17 ENCOUNTER — Ambulatory Visit (INDEPENDENT_AMBULATORY_CARE_PROVIDER_SITE_OTHER): Payer: Medicare Other | Admitting: Family Medicine

## 2016-10-17 ENCOUNTER — Encounter: Payer: Self-pay | Admitting: Family Medicine

## 2016-10-17 VITALS — BP 141/85 | HR 80 | Ht 67.75 in | Wt 221.2 lb

## 2016-10-17 DIAGNOSIS — E7800 Pure hypercholesterolemia, unspecified: Secondary | ICD-10-CM

## 2016-10-17 DIAGNOSIS — E669 Obesity, unspecified: Secondary | ICD-10-CM | POA: Diagnosis not present

## 2016-10-17 DIAGNOSIS — R7303 Prediabetes: Secondary | ICD-10-CM

## 2016-10-17 DIAGNOSIS — I10 Essential (primary) hypertension: Secondary | ICD-10-CM | POA: Diagnosis not present

## 2016-10-17 DIAGNOSIS — E66811 Obesity, class 1: Secondary | ICD-10-CM

## 2016-10-17 LAB — POCT GLYCOSYLATED HEMOGLOBIN (HGB A1C): HEMOGLOBIN A1C: 6

## 2016-10-17 MED ORDER — LOSARTAN POTASSIUM-HCTZ 100-25 MG PO TABS: 1.0000 | ORAL_TABLET | Freq: Every day | ORAL | 1 refills | Status: DC

## 2016-10-17 MED ORDER — METOPROLOL SUCCINATE ER 25 MG PO TB24: 25.0000 mg | ORAL_TABLET | Freq: Every day | ORAL | 1 refills | Status: DC

## 2016-10-17 NOTE — Patient Instructions (Signed)
Continue your losartan hydrochlorothiazide tablets and we are adding metoprolol.  Please try to not drink much past 5 PM so that you're not up urinating in the evening.  Also try to work on low saturated and Transfats diet because in December regarding a recheck all of your labs.  If you can work on a little bit of weight loss before then we'll recheck all your labs look much better and will continue to well control your sugar.    Guidelines for a Low Cholesterol, Low Saturated Fat Diet  Fats - Limit total intake of fats and oils. - Avoid butter, stick margarine, shortening, lard, palm and coconut oils. - Limit mayonnaise, salad dressings, gravies and sauces, unless they are homemade with low-fat ingredients. - Limit chocolate. - Choose low-fat and nonfat products, such as low-fat mayonnaise, low-fat or non-hydrogenated peanut butter, low-fat or fat-free salad dressings and nonfat gravy. - Use vegetable oil, such as canola or olive oil. - Look for margarine that does not contain trans fatty acids. - Use nuts in moderate amounts. - Read ingredient labels carefully to determine both amount and type of fat present in foods. Limit saturated and trans fats! - Avoid high-fat processed and convenience foods.  Meats and Meat Alternatives - Choose fish, chicken, [place] and lean meats. - Use dried beans, peas, lentils and tofu. - Limit egg yolks to three to four per week. - If you eat red meat, limit to no more than three servings per week and choose loin or round cuts. - Avoid fatty meats, such as bacon, sausage, franks, luncheon meats and ribs. - Avoid all organ meats, including liver.  Dairy - Choose nonfat or low-fat milk, yogurt and cottage cheese. - Most cheeses are high in fat. Choose cheeses made from non-fat milk, such as mozzarella and ricotta cheese. - Choose light or fat-free cream cheese and sour cream. - Avoid cream and sauces made with cream.  Fruits and Vegetables - Eat a  wide variety of fruits and vegetables. - Use lemon juice, vinegar or "mist" olive oil on vegetables. - Avoid adding sauces, fat or oil to vegetables.  Breads, Cereals and Grains - Choose whole-grain breads, cereals, pastas and rice. - Avoid high-fat snack foods, such as granola, cookies, pies, pastries, doughnuts and croissants.  Cooking Tips - Avoid deep fried foods. - Trim visible fat off meats and remove skin from poultry before cooking. - Bake, broil, boil, poach or roast poultry, fish and lean meats. - Drain and discard fat that drains out of meat as you cook it. - Add little or no fat to foods. - Use vegetable oil sprays to grease pans for cooking or baking. - Steam vegetables. - Use herbs or no-oil marinades to flavor foods.

## 2016-10-17 NOTE — Progress Notes (Signed)
New patient office visit note:  Impression and Recommendations:    1. Prediabetes   2. Essential hypertension   3. Obesity, Class I, BMI 30-34.9   4. Elevated LDL cholesterol level      Patient told to come in in the near future, sometime in November, for yearly physical exam.     She will come fasting and at that time we will obtain blood work since she has not had any in over a year.  Continue your losartan hydrochlorothiazide tablets and we are adding metoprolol.  Please try to not drink much past 5 PM so that you're not up urinating in the evening.  Also try to work on low saturated and Transfats diet because in December regarding a recheck all of your labs.  If you can work on a little bit of weight loss before then we'll recheck all your labs look much better and will continue to well control your sugar.     No medication refills until patient comes in for blood work!    Orders Placed This Encounter  Procedures  . POCT glycosylated hemoglobin (Hb A1C)    New Prescriptions   METOPROLOL SUCCINATE (TOPROL-XL) 25 MG 24 HR TABLET    Take 1 tablet (25 mg total) daily by mouth.    Modified Medications   Modified Medication Previous Medication   LOSARTAN-HYDROCHLOROTHIAZIDE (HYZAAR) 100-25 MG TABLET losartan-hydrochlorothiazide (HYZAAR) 100-25 MG tablet      Take 1 tablet daily by mouth.    TAKE 1 TABLET BY MOUTH EVERY DAY    Discontinued Medications   No medications on file    Return for Return in about 4-5 months, and in November., come  fasting is we will get blood work..  The patient was counseled, risk factors were discussed, anticipatory guidance given.  Gross side effects, risk and benefits, and alternatives of medications discussed with patient.  Patient is aware that all medications have potential side effects and we are unable to predict every side effect or drug-drug interaction that may occur.  Expresses verbal understanding and consents to  current therapy plan and treatment regimen.  Please see AVS handed out to patient at the end of our visit for further patient instructions/ counseling done pertaining to today's office visit.    Note: This document was prepared using Dragon voice recognition software and may include unintentional dictation errors.  ----------------------------------------------------------------------------------------------------------------------    Subjective:    Chief Complaint  Patient presents with  . Hypertension    follow up     HPI: Doris Harris is a pleasant 71 y.o. female who presents to [facility] today to review Her chronic medical issues and follow-up blood pressure, prediabetes  Pre-diabetes:    HTN:   Patient in the past had a rash with the valsartan but has been tolerating losartan very well, no rash.  Bp been at home is not well controlled  142/78-- 180's/90's.   Patient has had no rash to losartan-hydrochlorothiazide and has been tolerating it well.  She only her only complaint is that she urinates quite often and it keeps her up at night.  With the Hyzaar that she used to be on in the past, we have not changed her hydrochlorothiazide dose and it still at 25 mg.  Patient is not sure why this makes her urinate much more than the other pill did.  However she tells me when she does try to cut off  her fluid intake earlier in the day, she is able to sleep during the night and doesn't have problems awakening to urinate.  Trying to avoid salt.   In last 1 mo or so- no exercise-- taking care of everyone else and not herself.  Walks 2hrs per wk at church, about 50% of the weeks or so.   Wt:  A1c is 6.0 today.  She is lost 3 pounds since October 2017.  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  1 Son-age 46 and 4 grandchildren.   Patient lives alone at home.  Patient has never  smoked, does not drink and does not do drugs.  She bikes or walks 1 hour 3 or more days per week.  Started WW---> at 245lbs     Patient Care Team    Relationship Specialty Notifications Start End  Thomasene Lot, DO PCP - General Family Medicine  01/26/16      Wt Readings from Last 3 Encounters:  02/18/17 220 lb 8 oz (100 kg)  10/17/16 221 lb 3.2 oz (100.3 kg)  03/13/16 222 lb 4.8 oz (100.8 kg)   BP Readings from Last 3 Encounters:  02/18/17 132/82  10/17/16 (!) 141/85  03/13/16 130/81   Pulse Readings from Last 3 Encounters:  02/18/17 65  10/17/16 80  03/13/16 92   BMI Readings from Last 3 Encounters:  02/18/17 33.77 kg/m  10/17/16 33.88 kg/m  03/13/16 34.05 kg/m   Lab Results  Component Value Date   HGBA1C 5.7 (H) 02/18/2017   HGBA1C 6.0 10/17/2016   HGBA1C 5.6 03/13/2016    Patient Active Problem List   Diagnosis Date Noted  . Obesity, Class I, BMI 30-34.9 01/25/2016    Priority: High  . Prediabetes 01/25/2016    Priority: High  . Elevated LDL cholesterol level 01/25/2016    Priority: High  . Essential hypertension 12/02/2014    Priority: High  . Screening for colon cancer 09/01/2013  . Counseling on health promotion and disease prevention 09/01/2013  . URINALYSIS, ABNORMAL 02/08/2010     Past Medical History:  Diagnosis Date  . Hypertension   . IFG (impaired fasting glucose)    x1  . Obesity      Past Surgical History:  Procedure Laterality Date  . MM BREAST STEREO BX*L*R/S    . TOTAL ABDOMINAL HYSTERECTOMY  1997   fibroid  . uterine polyps    . WRIST FRACTURE SURGERY  2000     Family History  Problem Relation Age of Onset  . Hypertension Mother   . Hyperlipidemia Mother   . Hypertension Father   . Hypertension Brother   . Diabetes Maternal Grandmother   . Coronary artery disease Other   . Hypertension Other   . Asthma Other   . Hypertension Brother   . Hypertension Sister   . Healthy Son   . Hypertension Sister   .  Healthy Sister   . Hypertension Brother   . Healthy Brother   . Healthy Brother   . Healthy Brother   . Heart attack Brother      Social History   Substance and Sexual Activity  Drug Use No    Social History   Substance and Sexual Activity  Alcohol Use No    Social History   Tobacco Use  Smoking Status Never Smoker  Smokeless Tobacco Never Used    Patient's Medications  New  Prescriptions   METOPROLOL SUCCINATE (TOPROL-XL) 25 MG 24 HR TABLET    Take 1 tablet (25 mg total) daily by mouth.  Previous Medications   ASPIRIN EC 81 MG TABLET    Take 1 tablet (81 mg total) by mouth daily.  Modified Medications   Modified Medication Previous Medication   LOSARTAN-HYDROCHLOROTHIAZIDE (HYZAAR) 100-25 MG TABLET losartan-hydrochlorothiazide (HYZAAR) 100-25 MG tablet      Take 1 tablet daily by mouth.    TAKE 1 TABLET BY MOUTH EVERY DAY  Discontinued Medications   No medications on file    Allergies: Amlodipine; Ibuprofen; Lisinopril; Benazepril; and Valsartan-hydrochlorothiazide  Review of Systems  Constitutional: Negative.  Negative for chills, diaphoresis, fever, malaise/fatigue and weight loss.  HENT: Negative.  Negative for congestion, sore throat and tinnitus.   Eyes: Negative.  Negative for blurred vision, double vision and photophobia.  Respiratory: Negative.  Negative for cough and wheezing.   Cardiovascular: Negative.  Negative for chest pain and palpitations.  Gastrointestinal: Negative.  Negative for blood in stool, diarrhea, nausea and vomiting.  Genitourinary: Negative.  Negative for dysuria, frequency and urgency.  Musculoskeletal: Negative.  Negative for joint pain and myalgias.  Skin: Negative.  Negative for itching and rash.  Neurological: Negative.  Negative for dizziness, focal weakness, weakness and headaches.  Endo/Heme/Allergies: Negative.  Negative for environmental allergies and polydipsia. Does not bruise/bleed easily.  Psychiatric/Behavioral:  Negative.  Negative for depression and memory loss. The patient is not nervous/anxious and does not have insomnia.      Objective:   Blood pressure (!) 141/85, pulse 80, height 5' 7.75" (1.721 m), weight 221 lb 3.2 oz (100.3 kg). Body mass index is 33.88 kg/m. General: Well Developed, well nourished, and in no acute distress.  Neuro: Alert and oriented x3, extra-ocular muscles intact, sensation grossly intact.  HEENT: Normocephalic, atraumatic, pupils equal round reactive to light, neck supple Skin: no gross suspicious lesions or rashes  Cardiac: Regular rate and rhythm, no murmurs rubs or gallops.  Respiratory: Essentially clear to auscultation bilaterally. Not using accessory muscles, speaking in full sentences.  Abdominal: Soft, not grossly distended Musculoskeletal: Ambulates w/o diff, FROM * 4 ext.  Vasc: less 2 sec cap RF, warm and pink  Psych:  No HI/SI, judgement and insight good, Euthymic mood. Full Affect.

## 2016-11-10 ENCOUNTER — Other Ambulatory Visit: Payer: Self-pay | Admitting: Family Medicine

## 2016-11-26 ENCOUNTER — Ambulatory Visit: Payer: Medicare Other

## 2017-02-18 ENCOUNTER — Encounter: Payer: Self-pay | Admitting: Family Medicine

## 2017-02-18 ENCOUNTER — Ambulatory Visit (INDEPENDENT_AMBULATORY_CARE_PROVIDER_SITE_OTHER): Payer: Medicare Other | Admitting: Family Medicine

## 2017-02-18 VITALS — BP 132/82 | HR 65 | Ht 67.75 in | Wt 220.5 lb

## 2017-02-18 DIAGNOSIS — R7303 Prediabetes: Secondary | ICD-10-CM

## 2017-02-18 DIAGNOSIS — Z1231 Encounter for screening mammogram for malignant neoplasm of breast: Secondary | ICD-10-CM

## 2017-02-18 DIAGNOSIS — Z7189 Other specified counseling: Secondary | ICD-10-CM | POA: Diagnosis not present

## 2017-02-18 DIAGNOSIS — E7800 Pure hypercholesterolemia, unspecified: Secondary | ICD-10-CM

## 2017-02-18 DIAGNOSIS — Z1239 Encounter for other screening for malignant neoplasm of breast: Secondary | ICD-10-CM

## 2017-02-18 DIAGNOSIS — I10 Essential (primary) hypertension: Secondary | ICD-10-CM

## 2017-02-18 DIAGNOSIS — E66811 Obesity, class 1: Secondary | ICD-10-CM

## 2017-02-18 DIAGNOSIS — E669 Obesity, unspecified: Secondary | ICD-10-CM | POA: Diagnosis not present

## 2017-02-18 MED ORDER — METOPROLOL SUCCINATE ER 25 MG PO TB24: 25.0000 mg | ORAL_TABLET | Freq: Every day | ORAL | 1 refills | Status: DC

## 2017-02-18 MED ORDER — LOSARTAN POTASSIUM-HCTZ 100-25 MG PO TABS: 1.0000 | ORAL_TABLET | Freq: Every day | ORAL | 1 refills | Status: DC

## 2017-02-18 NOTE — Patient Instructions (Addendum)
- Mammo -patient declines and understands risks.  There is an order in and she can call the [facility] or [facility] for a appointment if she wishes  -We will get full lab work on you today.  We will call or contact you via my chart for any abnormalities.   Please realize, EXERCISE IS MEDICINE!  -  American Heart Association [facility]( AHA) guidelines for exercise : If you are in good health, without any medical conditions, you should engage in 150 minutes of moderate intensity aerobic activity per week.  This means you should be huffing and puffing throughout your workout.   Engaging in regular exercise will improve brain function and memory, as well as improve mood, boost immune system and help with weight management.  As well as the other, more well-known effects of exercise such as decreasing blood sugar levels, decreasing blood pressure,  and decreasing bad cholesterol levels/ increasing good cholesterol levels.     -  The AHA strongly endorses consumption of a diet that contains a variety of foods from all the food categories with an emphasis on fruits and vegetables; fat-free and low-fat dairy products; cereal and grain products; legumes and nuts; and fish, poultry, and/or extra lean meats.    Excessive food intake, especially of foods high in saturated and trans fats, sugar, and salt, should be avoided.    Adequate water intake of roughly 1/2 of your weight in pounds, should equal the ounces of water per day you should drink.  So for instance, if you're 200 pounds, that would be 100 ounces of water per day.         Mediterranean Diet  Why follow it? Research shows. . Those who follow the Mediterranean diet have a reduced risk of heart disease  . The diet is associated with a reduced incidence of Parkinson's and Alzheimer's diseases . People following the diet may have longer life expectancies and lower rates of chronic diseases  . The Dietary  Guidelines for Americans recommends the Mediterranean diet as an eating plan to promote health and prevent disease  What Is the Mediterranean Diet?  . Healthy eating plan based on typical foods and recipes of Mediterranean-style cooking . The diet is primarily a plant based diet; these foods should make up a majority of meals   Starches - Plant based foods should make up a majority of meals - They are an important sources of vitamins, minerals, energy, antioxidants, and fiber - Choose whole grains, foods high in fiber and minimally processed items  - Typical grain sources include wheat, oats, barley, corn, brown rice, bulgar, farro, millet, polenta, couscous  - Various types of beans include chickpeas, lentils, fava beans, black beans, white beans   Fruits  Veggies - Large quantities of antioxidant rich fruits & veggies; 6 or more servings  - Vegetables can be eaten raw or lightly drizzled with oil and cooked  - Vegetables common to the traditional Mediterranean Diet include: artichokes, arugula, beets, broccoli, brussel sprouts, cabbage, carrots, celery, collard greens, cucumbers, eggplant, kale, leeks, lemons, lettuce, mushrooms, okra, onions, peas, peppers, potatoes, pumpkin, radishes, rutabaga, shallots, spinach, sweet potatoes, turnips, zucchini - Fruits common to the Mediterranean Diet include: apples, apricots, avocados, cherries, clementines, dates, figs, grapefruits, grapes, melons, nectarines, oranges, peaches, pears, pomegranates, strawberries, tangerines  Fats - Replace butter and margarine with healthy oils, such as olive oil, canola oil, and tahini  - Limit nuts to no more than a handful a day  -  Nuts include walnuts, almonds, pecans, pistachios, pine nuts  - Limit or avoid candied, honey roasted or heavily salted nuts - Olives are central to the Mediterranean diet - can be eaten whole or used in a variety of dishes   Meats Protein - Limiting red meat: no more than a few times a  month - When eating red meat: choose lean cuts and keep the portion to the size of deck of cards - Eggs: approx. 0 to 4 times a week  - Fish and lean poultry: at least 2 a week  - Healthy protein sources include, chicken, [place], lean beef, lamb - Increase intake of seafood such as tuna, salmon, trout, mackerel, shrimp, scallops - Avoid or limit high fat processed meats such as sausage and bacon  Dairy - Include moderate amounts of low fat dairy products  - Focus on healthy dairy such as fat free yogurt, skim milk, low or reduced fat cheese - Limit dairy products higher in fat such as whole or 2% milk, cheese, ice cream  Alcohol - Moderate amounts of red wine is ok  - No more than 5 oz daily for women (all ages) and men older than age 30  - No more than 10 oz of wine daily for men younger than 18  Other - Limit sweets and other desserts  - Use herbs and spices instead of salt to flavor foods  - Herbs and spices common to the traditional Mediterranean Diet include: basil, bay leaves, chives, cloves, cumin, fennel, garlic, lavender, marjoram, mint, oregano, parsley, pepper, rosemary, sage, savory, sumac, tarragon, thyme   It's not just a diet, it's a lifestyle:  . The Mediterranean diet includes lifestyle factors typical of those in the region  . Foods, drinks and meals are best eaten with others and savored . Daily physical activity is important for overall good health . This could be strenuous exercise like running and aerobics . This could also be more leisurely activities such as walking, housework, yard-work, or taking the stairs . Moderation is the key; a balanced and healthy diet accommodates most foods and drinks . Consider portion sizes and frequency of consumption of certain foods   Meal Ideas & Options:  . Breakfast:  o Whole wheat toast or whole wheat English muffins with peanut butter & hard boiled egg o Steel cut oats topped with apples & cinnamon and skim milk  o Fresh  fruit: banana, strawberries, melon, berries, peaches  o Smoothies: strawberries, bananas, greek yogurt, peanut butter o Low fat greek yogurt with blueberries and granola  o Egg white omelet with spinach and mushrooms o Breakfast couscous: whole wheat couscous, apricots, skim milk, cranberries  . Sandwiches:  o Hummus and grilled vegetables (peppers, zucchini, squash) on whole wheat bread   o Grilled chicken on whole wheat pita with lettuce, tomatoes, cucumbers or tzatziki  o Tuna salad on whole wheat bread: tuna salad made with greek yogurt, olives, red peppers, capers, green onions o Garlic rosemary lamb pita: lamb sauted with garlic, rosemary, salt & pepper; add lettuce, cucumber, greek yogurt to pita - flavor with lemon juice and black pepper  . Seafood:  o Mediterranean grilled salmon, seasoned with garlic, basil, parsley, lemon juice and black pepper o Shrimp, lemon, and spinach whole-grain pasta salad made with low fat greek yogurt  o Seared scallops with lemon orzo  o Seared tuna steaks seasoned salt, pepper, coriander topped with tomato mixture of olives, tomatoes, olive oil, minced garlic, parsley, green onions and  cappers  . Meats:  o Herbed greek chicken salad with kalamata olives, cucumber, feta  o Red bell peppers stuffed with spinach, bulgur, lean ground beef (or lentils) & topped with feta   o Kebabs: skewers of chicken, tomatoes, onions, zucchini, squash  o [place] burgers: made with red onions, mint, dill, lemon juice, feta cheese topped with roasted red peppers . Vegetarian o Cucumber salad: cucumbers, artichoke hearts, celery, red onion, feta cheese, tossed in olive oil & lemon juice  o Hummus and whole grain pita points with a greek salad (lettuce, tomato, feta, olives, cucumbers, red onion) o Lentil soup with celery, carrots made with vegetable broth, garlic, salt and pepper  o Tabouli salad: parsley, bulgur, mint, scallions, cucumbers, tomato, radishes, lemon juice,  olive oil, salt and pepper.

## 2017-02-18 NOTE — Progress Notes (Signed)
Impression and Recommendations:    1. Prediabetes   2. Essential hypertension   3. Obesity, Class I, BMI 30-34.9   4. Elevated LDL cholesterol level   5. Counseling on health promotion and disease prevention   6. Screening for breast cancer    - Counseled patient on pathophysiology of the disease process of Pre-DM.   Stressed importance of dietary and lifestyle modifications as first line, in addition to discussing the risks and benefits of metformin.    - Handouts provided.    - Anticipatory guidance given.   - Recheck A1c in 4-29mo.  Told to make appt for this prior to leaving clinic today.   - Encouraged to return to clinic or call the office with any further questions or concerns.    Lifestyle changes such as dash diet and engaging in a regular exercise program discussed with patient.  Educational handouts provided  Ambulatory BP monitoring encouraged. Keep log and bring in next OV  Continue current medication(s).   Also, risks and benefits of medications discussed with patient, including alternative treatments.   Encouraged patient to read drug information handouts to further educate self about the medicine prior to starting it.   Contact us prior with any Q's/ concerns.   Explained to patient what BMI refers to, and what it means medically.    Told patient to think about it as a "medical risk stratification measurement" and how increasing BMI is associated with increasing risk/ or worsening state of various diseases such as hypertension, hyperlipidemia, diabetes, premature OA, depression etc.  American Heart Association guidelines for healthy diet, basically Mediterranean diet, and exercise guidelines of 30 minutes 5 days per week or more discussed in detail.  Health counseling performed.  All questions answered.    - Mammo -patient declines and understands risks.  There is an order in and she can call the [facility] or [facility] for a appointment if she wishes  -We will get full lab work on you today.  We will call or contact you via my chart for any abnormalities.    Education and routine counseling performed. Handouts provided.  Orders Placed This Encounter  Procedures  . Comprehensive metabolic panel  . Hemoglobin A1c  . Lipid panel  . TSH  . VITAMIN D 25 Hydroxy (Vit-D Deficiency, Fractures)  . CBC with Differential/Platelet    Return in about 6 months (around 08/18/2017) for Patient agrees to come in every 6 months for blood pressure etc.  Declines CPE.  The patient was counseled, risk factors were discussed, anticipatory guidance given.  Gross side effects, risk and benefits, and alternatives of medications discussed with patient.  Patient is aware that all medications have potential side effects and we are unable to predict every side effect or drug-drug interaction that may occur.  Expresses verbal understanding and consents to current therapy plan and treatment regimen.  Please see AVS handed out to patient at the end of our visit for further patient instructions/ counseling done pertaining to today's office visit.    Note: This document was prepared using Dragon voice recognition software and may include unintentional dictation errors.     Subjective:    Chief Complaint  Patient presents with  . Follow-up    HPI: Doris Harris is a 71 y.o. female who presents to [facility] today for follow up for HTN.    Last office visit on 10/17/2016 we started  patient on metoprolol XL in addition to her Hyzaar 100-25.  Her blood pressure last office visit was 141/85 upon recheck.  At home it has been running 139/80-79.  HTN:  -  Her blood pressure has been controlled at home.   - Patient reports good compliance with blood pressure medications  - Denies medication S-E   - Smoking Status noted   - She denies new onset of: chest pain, exercise  intolerance, shortness of breath, dizziness, visual changes, headache, lower extremity swelling or claudication.   Today their BP is BP: 132/82   Last 3 blood pressure readings in our office are as follows: BP Readings from Last 3 Encounters:  02/18/17 132/82  10/17/16 (!) 141/85  03/13/16 130/81    Pulse Readings from Last 3 Encounters:  02/18/17 65  10/17/16 80  03/13/16 92       Pre-diabetes:   Exercising- walking 3 d/wk 30 min- 60 min.     Filed Weights   02/18/17 1042  Weight: 220 lb 8 oz (100 kg)      Patient Care Team    Relationship Specialty Notifications Start End  Thomasene Lotpalski, Reeta Kuk, DO PCP - General Family Medicine  01/26/16      Lab Results  Component Value Date   CREATININE 0.97 02/18/2017   BUN 13 02/18/2017   NA 141 02/18/2017   K 4.1 02/18/2017   CL 98 02/18/2017   CO2 28 02/18/2017    Lab Results  Component Value Date   CHOL 229 (H) 02/18/2017   CHOL 223 (H) 03/13/2016   CHOL 211 (H) 02/22/2015    Lab Results  Component Value Date   HDL 89 02/18/2017   HDL 78 03/13/2016   HDL 77.90 02/22/2015    Lab Results  Component Value Date   LDLCALC 124 (H) 02/18/2017   LDLCALC 129 (H) 03/13/2016   LDLCALC 112 (H) 02/22/2015    Lab Results  Component Value Date   TRIG 78 02/18/2017   TRIG 81 03/13/2016   TRIG 105.0 02/22/2015    Lab Results  Component Value Date   CHOLHDL 2.6 02/18/2017   CHOLHDL 2.9 03/13/2016   CHOLHDL 3 02/22/2015    Lab Results  Component Value Date   LDLDIRECT 120.2 09/05/2012   LDLDIRECT 110.8 07/17/2011   LDLDIRECT 119.6 02/01/2010   ===================================================================   Patient Active Problem List   Diagnosis Date Noted  . Obesity, Class I, BMI 30-34.9 01/25/2016    Priority: High  . Prediabetes 01/25/2016    Priority: High  . Elevated LDL cholesterol level 01/25/2016    Priority: High  . Essential hypertension 12/02/2014    Priority: High  . Screening for  colon cancer 09/01/2013  . Counseling on health promotion and disease prevention 09/01/2013  . URINALYSIS, ABNORMAL 02/08/2010     Past Medical History:  Diagnosis Date  . Hypertension   . IFG (impaired fasting glucose)    x1  . Obesity      Past Surgical History:  Procedure Laterality Date  . MM BREAST STEREO BX*L*R/S    . TOTAL ABDOMINAL HYSTERECTOMY  1997   fibroid  . uterine polyps    . WRIST FRACTURE SURGERY  2000     Family History  Problem Relation Age of Onset  . Hypertension Mother   . Hyperlipidemia Mother   . Hypertension Father   . Hypertension Brother   . Diabetes Maternal Grandmother   . Coronary artery disease Other   . Hypertension Other   .  Asthma Other   . Hypertension Brother   . Hypertension Sister   . Healthy Son   . Hypertension Sister   . Healthy Sister   . Hypertension Brother   . Healthy Brother   . Healthy Brother   . Healthy Brother   . Heart attack Brother      Social History   Substance and Sexual Activity  Drug Use No  ,  Social History   Substance and Sexual Activity  Alcohol Use No  ,  Social History   Tobacco Use  Smoking Status Never Smoker  Smokeless Tobacco Never Used  ,    Current Outpatient Medications on File Prior to Visit  Medication Sig Dispense Refill  . aspirin EC 81 MG tablet Take 1 tablet (81 mg total) by mouth daily. 90 tablet 3   No current facility-administered medications on file prior to visit.      Allergies  Allergen Reactions  . Amlodipine Nausea And Vomiting  . Ibuprofen     REACTION: unspecified  . Lisinopril Cough  . Benazepril Rash  . Valsartan-Hydrochlorothiazide Rash     Review of Systems:   General:  Denies fever, chills Optho/Auditory:   Denies visual changes, blurred vision Respiratory:   Denies SOB, cough, wheeze, DIB  Cardiovascular:   Denies chest pain, palpitations, painful respirations Gastrointestinal:   Denies nausea, vomiting, diarrhea.  Endocrine:      Denies new hot or cold intolerance Musculoskeletal:  Denies joint swelling, gait issues, or new unexplained myalgias/ arthralgias Skin:  Denies rash, suspicious lesions  Neurological:    Denies dizziness, unexplained weakness, numbness  Psychiatric/Behavioral:   Denies mood changes  Objective:    Blood pressure 132/82, pulse 65, height 5' 7.75" (1.721 m), weight 220 lb 8 oz (100 kg).  Body mass index is 33.77 kg/m.  General: Well Developed, well nourished, and in no acute distress.  HEENT: Normocephalic, atraumatic, pupils equal round reactive to light, neck supple, No carotid bruits, no JVD Skin: Warm and dry, cap RF less 2 sec Cardiac: Regular rate and rhythm, S1, S2 WNL's, no murmurs rubs or gallops Respiratory: ECTA B/L, Not using accessory muscles, speaking in full sentences. NeuroM-Sk: Ambulates w/o assistance, moves ext * 4 w/o difficulty, sensation grossly intact.  Ext: scant edema b/l lower ext Psych: No HI/SI, judgement and insight good, Euthymic mood. Full Affect.

## 2017-02-19 ENCOUNTER — Telehealth: Payer: Self-pay | Admitting: Family Medicine

## 2017-02-19 LAB — TSH: TSH: 1.23 u[IU]/mL (ref 0.450–4.500)

## 2017-02-19 LAB — CBC WITH DIFFERENTIAL/PLATELET
BASOS ABS: 0 10*3/uL (ref 0.0–0.2)
Basos: 1 %
EOS (ABSOLUTE): 0.1 10*3/uL (ref 0.0–0.4)
Eos: 2 %
HEMOGLOBIN: 13.2 g/dL (ref 11.1–15.9)
Hematocrit: 38.6 % (ref 34.0–46.6)
IMMATURE GRANS (ABS): 0 10*3/uL (ref 0.0–0.1)
Immature Granulocytes: 0 %
LYMPHS: 42 %
Lymphocytes Absolute: 2.5 10*3/uL (ref 0.7–3.1)
MCH: 31.4 pg (ref 26.6–33.0)
MCHC: 34.2 g/dL (ref 31.5–35.7)
MCV: 92 fL (ref 79–97)
MONOCYTES: 6 %
Monocytes Absolute: 0.4 10*3/uL (ref 0.1–0.9)
NEUTROS ABS: 2.9 10*3/uL (ref 1.4–7.0)
Neutrophils: 49 %
PLATELETS: 211 10*3/uL (ref 150–379)
RBC: 4.21 x10E6/uL (ref 3.77–5.28)
RDW: 13.9 % (ref 12.3–15.4)
WBC: 5.9 10*3/uL (ref 3.4–10.8)

## 2017-02-19 LAB — COMPREHENSIVE METABOLIC PANEL
A/G RATIO: 1.3 (ref 1.2–2.2)
ALBUMIN: 4.4 g/dL (ref 3.5–4.8)
ALT: 24 IU/L (ref 0–32)
AST: 23 IU/L (ref 0–40)
Alkaline Phosphatase: 85 IU/L (ref 39–117)
BILIRUBIN TOTAL: 0.8 mg/dL (ref 0.0–1.2)
BUN / CREAT RATIO: 13 (ref 12–28)
BUN: 13 mg/dL (ref 8–27)
CHLORIDE: 98 mmol/L (ref 96–106)
CO2: 28 mmol/L (ref 20–29)
Calcium: 9.7 mg/dL (ref 8.7–10.3)
Creatinine, Ser: 0.97 mg/dL (ref 0.57–1.00)
GFR calc non Af Amer: 59 mL/min/{1.73_m2} — ABNORMAL LOW (ref >59)
GFR, EST AFRICAN AMERICAN: 68 mL/min/{1.73_m2} (ref >59)
Globulin, Total: 3.4 g/dL (ref 1.5–4.5)
Glucose: 100 mg/dL — ABNORMAL HIGH (ref 65–99)
POTASSIUM: 4.1 mmol/L (ref 3.5–5.2)
SODIUM: 141 mmol/L (ref 134–144)
TOTAL PROTEIN: 7.8 g/dL (ref 6.0–8.5)

## 2017-02-19 LAB — VITAMIN D 25 HYDROXY (VIT D DEFICIENCY, FRACTURES): VIT D 25 HYDROXY: 35.4 ng/mL (ref 30.0–100.0)

## 2017-02-19 LAB — LIPID PANEL
CHOL/HDL RATIO: 2.6 ratio (ref 0.0–4.4)
Cholesterol, Total: 229 mg/dL — ABNORMAL HIGH (ref 100–199)
HDL: 89 mg/dL (ref >39)
LDL Calculated: 124 mg/dL — ABNORMAL HIGH (ref 0–99)
TRIGLYCERIDES: 78 mg/dL (ref 0–149)
VLDL Cholesterol Cal: 16 mg/dL (ref 5–40)

## 2017-02-19 LAB — HEMOGLOBIN A1C
Est. average glucose Bld gHb Est-mCnc: 117 mg/dL
HEMOGLOBIN A1C: 5.7 % — AB (ref 4.8–5.6)

## 2017-02-19 NOTE — Telephone Encounter (Signed)
Patient called saying she cant remember the website for tracking foods that Dr. Val Eagle told her about and would like to ask Dr. Val Eagle what it was

## 2017-02-20 NOTE — Telephone Encounter (Signed)
I recommend the same Apps to every 1-   Lose it  My fitness pal

## 2017-02-20 NOTE — Telephone Encounter (Signed)
Please advise with the name thanks MPulliam, CMA/RT(R)

## 2017-02-20 NOTE — Telephone Encounter (Signed)
Called and informed patient. MPulliam, CMA/RT(R)

## 2017-08-13 ENCOUNTER — Emergency Department (HOSPITAL_BASED_OUTPATIENT_CLINIC_OR_DEPARTMENT_OTHER)
Admission: EM | Admit: 2017-08-13 | Discharge: 2017-08-13 | Disposition: A | Discharge status: Home / Self Care | Payer: Medicare Other | Attending: Emergency Medicine | Admitting: Emergency Medicine

## 2017-08-13 ENCOUNTER — Encounter (HOSPITAL_BASED_OUTPATIENT_CLINIC_OR_DEPARTMENT_OTHER): Payer: Self-pay | Admitting: *Deleted

## 2017-08-13 ENCOUNTER — Emergency Department (HOSPITAL_BASED_OUTPATIENT_CLINIC_OR_DEPARTMENT_OTHER): Payer: Medicare Other

## 2017-08-13 ENCOUNTER — Other Ambulatory Visit: Payer: Self-pay

## 2017-08-13 DIAGNOSIS — R600 Localized edema: Secondary | ICD-10-CM | POA: Diagnosis not present

## 2017-08-13 DIAGNOSIS — Z79899 Other long term (current) drug therapy: Secondary | ICD-10-CM | POA: Diagnosis not present

## 2017-08-13 DIAGNOSIS — I10 Essential (primary) hypertension: Secondary | ICD-10-CM | POA: Insufficient documentation

## 2017-08-13 DIAGNOSIS — M7989 Other specified soft tissue disorders: Secondary | ICD-10-CM | POA: Diagnosis not present

## 2017-08-13 DIAGNOSIS — Z7982 Long term (current) use of aspirin: Secondary | ICD-10-CM | POA: Diagnosis not present

## 2017-08-13 DIAGNOSIS — M25562 Pain in left knee: Secondary | ICD-10-CM | POA: Insufficient documentation

## 2017-08-13 MED ORDER — HYDROCODONE-ACETAMINOPHEN 5-325 MG PO TABS
1.0000 | ORAL_TABLET | Freq: Once | ORAL | Status: AC
  Administered 2017-08-13: 1 via ORAL
  Filled 2017-08-13: qty 1

## 2017-08-13 MED ORDER — DICLOFENAC SODIUM 1 % TD GEL: 2.0000 g | Freq: Four times a day (QID) | TRANSDERMAL | 0 refills | Status: DC

## 2017-08-13 MED ORDER — ACETAMINOPHEN 500 MG PO TABS: 1000.0000 mg | ORAL_TABLET | Freq: Four times a day (QID) | ORAL | 0 refills | Status: DC | PRN

## 2017-08-13 NOTE — Discharge Instructions (Addendum)
As discussed, apply gel to your knee, take Tylenol 1000 mg every 6 hours but do not exceed 4000 mg of acetaminophen containing products in a 24-hour period.  Make sure that you follow-up with your primary care provider tomorrow.  Return if symptoms worsen, redness, warmth, fever, chills, numbness, weakness, pain in your calf or swelling in your lower extremity, chest pain or shortness of breath or any other new concerning symptoms in the meantime.

## 2017-08-13 NOTE — ED Provider Notes (Signed)
[facility] EMERGENCY DEPARTMENT Provider Note   CSN: [id number] Arrival date & time: 08/13/17  1217     History   Chief Complaint Chief Complaint  Patient presents with  . Knee Pain    HPI Doris Harris is a 72 y.o. female with past medical history of hypertension presenting with progressive onset left knee pain worsening over the last 2 weeks.  She reports swelling.  Has tried ibuprofen without relief.  Her pain is aggravated by extension and relieved by flexion.  She denies redness, fever, chills, nausea, vomiting or other systemic symptoms.  Has been otherwise feeling well.  No numbness or tingling.  No fall, injury or trauma. No history of DVT/PE, no hormone use, no recent surgeries, no prolonged immobilization, no history of malignancy, no calf pain or swelling in the lower leg other than medial aspect of the left knee.  No chest pain /shortness of breath.  HPI  Past Medical History:  Diagnosis Date  . Hypertension   . IFG (impaired fasting glucose)    x1  . Obesity     Patient Active Problem List   Diagnosis Date Noted  . Obesity, Class I, BMI 30-34.9 01/25/2016  . Prediabetes 01/25/2016  . Elevated LDL cholesterol level 01/25/2016  . Essential hypertension 12/02/2014  . Screening for colon cancer 09/01/2013  . Counseling on health promotion and disease prevention 09/01/2013  . URINALYSIS, ABNORMAL 02/08/2010    Past Surgical History:  Procedure Laterality Date  . MM BREAST STEREO BX*L*R/S    . TOTAL ABDOMINAL HYSTERECTOMY  1997   fibroid  . uterine polyps    . WRIST FRACTURE SURGERY  2000     OB History    Gravida  1   Para  1   Term      Preterm      AB      Living        SAB      TAB      Ectopic      Multiple      Live Births               Home Medications    Prior to Admission medications   Medication Sig Start Date End Date Taking? Authorizing Provider  acetaminophen (TYLENOL) 500 MG tablet Take 2 tablets  (1,000 mg total) by mouth every 6 (six) hours as needed for mild pain or moderate pain. 08/13/17   Georgiana Shore, PA-C  aspirin EC 81 MG tablet Take 1 tablet (81 mg total) by mouth daily. 03/13/16   Opalski, Gavin Pound, DO  diclofenac sodium (VOLTAREN) 1 % GEL Apply 2 g topically 4 (four) times daily. 08/13/17   Georgiana Shore, PA-C  losartan-hydrochlorothiazide [facility]) 100-25 MG tablet Take 1 tablet daily by mouth. 02/18/17   Opalski, Gavin Pound, DO  metoprolol succinate (TOPROL-XL) 25 MG 24 hr tablet Take 1 tablet (25 mg total) daily by mouth. 02/18/17   Opalski, Gavin Pound, DO    Family History Family History  Problem Relation Age of Onset  . Hypertension Mother   . Hyperlipidemia Mother   . Hypertension Father   . Hypertension Brother   . Diabetes Maternal Grandmother   . Coronary artery disease Other   . Hypertension Other   . Asthma Other   . Hypertension Brother   . Hypertension Sister   . Healthy Son   . Hypertension Sister   . Healthy Sister   . Hypertension Brother   . Healthy Brother   .  Healthy Brother   . Healthy Brother   . Heart attack Brother     Social History Social History   Tobacco Use  . Smoking status: Never Smoker  . Smokeless tobacco: Never Used  Substance Use Topics  . Alcohol use: No  . Drug use: No     Allergies   Amlodipine; Ibuprofen; Lisinopril; Benazepril; and Valsartan-hydrochlorothiazide   Review of Systems Review of Systems  Constitutional: Negative for chills, diaphoresis, fatigue and fever.  Gastrointestinal: Negative for nausea and vomiting.  Genitourinary: Negative for difficulty urinating.  Musculoskeletal: Positive for arthralgias and joint swelling. Negative for myalgias, neck pain and neck stiffness.  Skin: Negative for color change, pallor and rash.  Neurological: Negative for dizziness, tremors, weakness, light-headedness and numbness.     Physical Exam Updated Vital Signs BP (!) 159/99 (BP Location: Right Arm)    Pulse 60   Temp 98.1 F (36.7 C) (Oral)   Resp 20   Ht  (1.727 m)   Wt 100.7 kg (222 lb)   SpO2 100%   BMI 33.75 kg/m   Physical Exam  Constitutional: She appears well-developed and well-nourished. No distress.  HENT:  Head: Normocephalic.  Eyes: Right eye exhibits no discharge. Left eye exhibits no discharge.  Cardiovascular: Normal rate, regular rhythm, normal heart sounds and intact distal pulses.  Pulmonary/Chest: Effort normal and breath sounds normal. No stridor. No respiratory distress. She has no wheezes. She has no rales.  Musculoskeletal: Normal range of motion. She exhibits edema and tenderness. She exhibits no deformity.  Full range of motion of the knee, stable joint, no erythema or warmth.  Edema to the medial and distal aspect of the left knee.  No tenderness palpation of the popliteal fossa or calf.  Patient reports pain deep inside the knee joint with extension and firm palpation of the inferior patella, otherwise no tenderness to palpation. Negative Mc[place]ray  Neurological: She is alert. No sensory deficit. She exhibits normal muscle tone.  5/5 strength in lower extremities bilaterally, sensation intact, strong dorsalis pedis pulses, neurovascularly intact.  Skin: Skin is dry. No rash noted. She is not diaphoretic. No erythema. No pallor.  Psychiatric: She has a normal mood and affect. Her behavior is normal.  Nursing note and vitals reviewed.    ED Treatments / Results  Labs (all labs ordered are listed, but only abnormal results are displayed) Labs Reviewed - No data to display  EKG None  Radiology Dg Knee Complete 4 Views Left  Result Date: 08/13/2017 CLINICAL DATA:  72 year old female with left knee pain and swelling for 2 weeks. Pain maximal behind the knee. EXAM: LEFT KNEE - COMPLETE 4+ VIEW COMPARISON:  None. FINDINGS: Moderate to severe lateral compartment and patellofemoral compartment degenerative spurring with similar loss of joint space  suspected. No joint effusion. Better preserved medial compartment with only mild for age degenerative spurring. No acute osseous abnormality identified. IMPRESSION: No acute osseous abnormality identified. Moderate to severe lateral and patellofemoral compartment degenerative changes. Electronically Signed   By: Odessa Fleming M.D.   On: 08/13/2017 13:12    Procedures Procedures (including critical care time)  Medications Ordered in ED Medications  HYDROcodone-acetaminophen (NORCO/VICODIN) 5-325 MG per tablet 1 tablet (1 tablet Oral Given 08/13/17 1654)     Initial Impression / Assessment and Plan / ED Course  I have reviewed the triage vital signs and the nursing notes.  Pertinent labs & imaging results that were available during my care of the patient were reviewed by me  and considered in my medical decision making (see chart for details).     Patient presenting with progressive onset left knee pain with edema to the medial distal aspect of the left knee. Plain films show evidence of chronic degenerative changes.  No acute injury or trauma. Patient's pain was managed while in the emergency department and knee sleeve provided. Ultrasound rule out DVT. On reassessment, patient reported significant improvement and wanted to go home. She was able to ambulate without difficulties.  Patient declined ultrasound imaging to rule out DVT.  Although DVT less likely to explain patient's symptoms.  X-ray showing degenerative changes that can easily account for her symptoms and her swelling is focal.  No history of DVT/PE, no estrogen use. Main risk factor related to patient's age. Discussed the risks associated with DVT and pulmonary embolism including death and patient continues to decline.  Low suspicion for septic arthritis, no erythema, warmth, fever or other systemic symptoms.  No pain with range of motion, pain only on extension.  She has an appointment with her PCP tomorrow afternoon and will be  following up closely.  Discharge home with symptomatic relief and close follow-up. Discussed strict return precautions advised to return if any worsening, swelling, pain, chest pain or shortness of breath and patient agrees and understands plan.  Final Clinical Impressions(s) / ED Diagnoses   Final diagnoses:  Acute pain of left knee    ED Discharge Orders        Ordered    diclofenac sodium (VOLTAREN) 1 % GEL  4 times daily     08/13/17 1757    acetaminophen (TYLENOL) 500 MG tablet  Every 6 hours PRN     08/13/17 1757       Georgiana Shore, PA-C 08/13/17 1805    Nira Conn, MD 08/14/17 0210

## 2017-08-13 NOTE — ED Notes (Signed)
NAD at this time. Pt is stable and going home.  

## 2017-08-13 NOTE — ED Triage Notes (Signed)
Left knee pain x 2 weeks. Pain and swelling. Pain is worse behind her knee. -

## 2017-08-14 ENCOUNTER — Ambulatory Visit (INDEPENDENT_AMBULATORY_CARE_PROVIDER_SITE_OTHER): Payer: Medicare Other | Admitting: Family Medicine

## 2017-08-14 ENCOUNTER — Encounter: Payer: Self-pay | Admitting: Family Medicine

## 2017-08-14 VITALS — BP 129/83 | HR 66 | Ht 67.75 in | Wt 222.4 lb

## 2017-08-14 DIAGNOSIS — M25562 Pain in left knee: Secondary | ICD-10-CM | POA: Diagnosis not present

## 2017-08-14 DIAGNOSIS — E669 Obesity, unspecified: Secondary | ICD-10-CM | POA: Diagnosis not present

## 2017-08-14 DIAGNOSIS — I10 Essential (primary) hypertension: Secondary | ICD-10-CM | POA: Diagnosis not present

## 2017-08-14 DIAGNOSIS — M1712 Unilateral primary osteoarthritis, left knee: Secondary | ICD-10-CM | POA: Diagnosis not present

## 2017-08-14 MED ORDER — HYDROCHLOROTHIAZIDE 12.5 MG PO TABS: 12.5000 mg | ORAL_TABLET | Freq: Every day | ORAL | 0 refills | Status: DC

## 2017-08-14 MED ORDER — OLMESARTAN MEDOXOMIL 40 MG PO TABS: 40.0000 mg | ORAL_TABLET | Freq: Every day | ORAL | 0 refills | Status: DC

## 2017-08-14 NOTE — Patient Instructions (Signed)
Ice 15 minutes every hour front and back of your knee.  It is any problems with the consultation please call and speak with Joselyn Glassman upfront as he does those referrals.  Please see the handout we gave you on patellofemoral syndrome for further information.

## 2017-08-14 NOTE — Progress Notes (Signed)
Pt here for an acute care OV today   Impression and Recommendations:    1. Patellofemoral arthritis of left knee   2. Obesity, Class I, BMI 30-34.9   3. Essential hypertension   4. Acute pain of left knee-without injury     1. Patellofemoral arthritis of L knee/acute pain of L knee w/o injury -referral given to orthopedist -ice your knee multiple times a day for 15-20 minutes at a time. -avoid aggravating activities that cause pain to your knee.   Obesity -recommend losing weight.  HTN -start olmesartan. -d/c losartan due to recall. -FUP 6-8 weeks after BP change.   Meds ordered this encounter  Medications  . olmesartan (BENICAR) 40 MG tablet    Sig: Take 1 tablet (40 mg total) by mouth daily.    Dispense:  90 tablet    Refill:  0  . hydrochlorothiazide (HYDRODIURIL) 12.5 MG tablet    Sig: Take 1 tablet (12.5 mg total) by mouth daily.    Dispense:  90 tablet    Refill:  0    Orders Placed This Encounter  Procedures  . Ambulatory referral to Orthopedic Surgery     Education and routine counseling performed. Handouts provided  Gross side effects, risk and benefits, and alternatives of medications and treatment plan in general discussed with patient.  Patient is aware that all medications have potential side effects and we are unable to predict every side effect or drug-drug interaction that may occur.   Patient will call with any questions prior to using medication if they have concerns.  Expresses verbal understanding and consents to current therapy and treatment regimen.  No barriers to understanding were identified.  Red flag symptoms and signs discussed in detail.  Patient expressed understanding regarding what to do in case of emergency\urgent symptoms   Please see AVS handed out to patient at the end of our visit for further patient instructions/ counseling done pertaining to today's office visit.   Return for 6-8wks rechk BP since changed meds due to med  insurance.     Note: This document was prepared occasionally using Dragon voice recognition software and may include unintentional dictation errors in addition to a scribe.   This document serves as a record of services personally performed by Thomasene Lot, DO. It was created on her behalf by Thelma Barge, a trained medical scribe. The creation of this record is based on the scribe's personal observations and the provider's statements to them.   I have reviewed the above medical documentation for accuracy and completeness and I concur.  Thomasene Lot 08/14/17 5:41 PM   --------------------------------------------------------------------------------------------------------------------------------------------------------------------------   Subjective:    CC:  Chief Complaint  Patient presents with  . Knee Pain    HPI: Doris Harris is a 72 y.o. female who presents to [facility] today for issues as discussed below.  She complains of L knee pain with associated swelling that began 2 weeks ago. She states she woke up and her knee started hurting. Her pain is worse in the top of her knee. She has tried icing it with mild relief. She went to the ED yesterday with imaging that showed moderate to severe lateral compartment and patellofemoral compartment degenerative spurring. She was prescribed tylenol and voltaren gel, which she has not gotten filled yet.   She denies any fall, mechanism of injury or trauma to the area. She is working 4 hours a day only twice a week.  Pt also reports her losartan was recalled.   Problem  Patellofemoral Arthritis of Left Knee  Acute Pain of Left Knee    Wt Readings from Last 3 Encounters:  08/14/17 222 lb 6.4 oz (100.9 kg)  08/13/17 222 lb (100.7 kg)  02/18/17 220 lb 8 oz (100 kg)   BP Readings from Last 3 Encounters:  08/14/17 129/83  08/13/17 (!) 159/99  02/18/17 132/82   BMI Readings from Last 3  Encounters:  08/14/17 34.07 kg/m  08/13/17 33.75 kg/m  02/18/17 33.77 kg/m     Patient Care Team    Relationship Specialty Notifications Start End  Thomasene Lot, DO PCP - General Family Medicine  01/26/16      Patient Active Problem List   Diagnosis Date Noted  . Obesity, Class I, BMI 30-34.9 01/25/2016    Priority: High  . Prediabetes 01/25/2016    Priority: High  . Elevated LDL cholesterol level 01/25/2016    Priority: High  . Essential hypertension 12/02/2014    Priority: High  . Patellofemoral arthritis of left knee 08/14/2017  . Acute pain of left knee 08/14/2017  . Screening for colon cancer 09/01/2013  . Counseling on health promotion and disease prevention 09/01/2013  . URINALYSIS, ABNORMAL 02/08/2010    Past Medical history, Surgical history, Family history, Social history, Allergies and Medications have been entered into the medical record, reviewed and changed as needed.    Current Meds  Medication Sig  . metoprolol succinate (TOPROL-XL) 25 MG 24 hr tablet Take 1 tablet (25 mg total) daily by mouth.  . [DISCONTINUED] losartan-hydrochlorothiazide (HYZAAR) 100-25 MG tablet Take 1 tablet daily by mouth.    Allergies:  Allergies  Allergen Reactions  . Amlodipine Nausea And Vomiting  . Ibuprofen     REACTION: unspecified  . Lisinopril Cough  . Benazepril Rash  . Valsartan-Hydrochlorothiazide Rash     Review of Systems: General:   Denies fever, chills, unexplained weight loss.  Optho/Auditory:   Denies visual changes, blurred vision/LOV Respiratory:   Denies wheeze, DOE more than baseline levels.  Cardiovascular:   Denies chest pain, palpitations, new onset peripheral edema  Gastrointestinal:   Denies nausea, vomiting, diarrhea, abd pain.  Genitourinary: Denies dysuria, freq/ urgency, flank pain or discharge from genitals.  Endocrine:     Denies hot or cold intolerance, polyuria, polydipsia. Musculoskeletal:   Denies unexplained myalgias, joint  swelling, unexplained arthralgias, gait problems.  Skin:  Denies new onset rash, suspicious lesions Neurological:     Denies dizziness, unexplained weakness, numbness  Psychiatric/Behavioral:   Denies mood changes, suicidal or homicidal ideations, hallucinations    Objective:   Blood pressure 129/83, pulse 66, height 5' 7.75" (1.721 m), weight 222 lb 6.4 oz (100.9 kg), SpO2 100 %. Body mass index is 34.07 kg/m. General:  Well Developed, well nourished, appropriate for stated age.  Neuro:  Alert and oriented,  extra-ocular muscles intact  HEENT:  Normocephalic, atraumatic, neck supple Skin:  no gross rash, warm, pink. Cardiac:  RRR, S1 S2 Respiratory:  ECTA B/L and A/P, Not using accessory muscles, speaking in full sentences- unlabored. Vascular:  Ext warm, no cyanosis apprec.; cap RF less 2 sec. Psych:  No HI/SI, judgement and insight good, Euthymic mood. Full Affect. L knee: diffuse anterior peripatellar swelling and increased warmth. Pt guarding and unable to examine fully due to pain. Antalgic gait.

## 2017-09-05 ENCOUNTER — Other Ambulatory Visit: Payer: Self-pay | Admitting: Family Medicine

## 2017-09-05 DIAGNOSIS — I10 Essential (primary) hypertension: Secondary | ICD-10-CM

## 2017-10-08 ENCOUNTER — Encounter: Payer: Self-pay | Admitting: Family Medicine

## 2017-10-08 ENCOUNTER — Ambulatory Visit (INDEPENDENT_AMBULATORY_CARE_PROVIDER_SITE_OTHER): Payer: Medicare Other | Admitting: Family Medicine

## 2017-10-08 VITALS — BP 140/85 | HR 68 | Ht 68.0 in | Wt 225.0 lb

## 2017-10-08 DIAGNOSIS — E7800 Pure hypercholesterolemia, unspecified: Secondary | ICD-10-CM | POA: Diagnosis not present

## 2017-10-08 DIAGNOSIS — R7303 Prediabetes: Secondary | ICD-10-CM | POA: Diagnosis not present

## 2017-10-08 DIAGNOSIS — E559 Vitamin D deficiency, unspecified: Secondary | ICD-10-CM | POA: Insufficient documentation

## 2017-10-08 DIAGNOSIS — E7889 Other lipoprotein metabolism disorders: Secondary | ICD-10-CM | POA: Diagnosis not present

## 2017-10-08 DIAGNOSIS — E669 Obesity, unspecified: Secondary | ICD-10-CM

## 2017-10-08 DIAGNOSIS — I10 Essential (primary) hypertension: Secondary | ICD-10-CM | POA: Diagnosis not present

## 2017-10-08 LAB — POCT GLYCOSYLATED HEMOGLOBIN (HGB A1C): Hemoglobin A1C: 5.3 % (ref 4.0–5.6)

## 2017-10-08 MED ORDER — OLMESARTAN MEDOXOMIL 40 MG PO TABS
40.0000 mg | ORAL_TABLET | Freq: Every day | ORAL | 1 refills | Status: DC
Start: 2017-10-08 — End: 2017-10-21

## 2017-10-08 MED ORDER — METOPROLOL SUCCINATE ER 25 MG PO TB24: 25.0000 mg | ORAL_TABLET | Freq: Every day | ORAL | 1 refills | Status: DC

## 2017-10-08 MED ORDER — HYDROCHLOROTHIAZIDE 12.5 MG PO TABS: 12.5000 mg | ORAL_TABLET | Freq: Every day | ORAL | 1 refills | Status: DC

## 2017-10-08 NOTE — Patient Instructions (Signed)
Managing Your Hypertension Hypertension is commonly called high blood pressure. This is when the force of your blood pressing against the walls of your arteries is too strong. Arteries are blood vessels that carry blood from your heart throughout your body. Hypertension forces the heart to work harder to pump blood, and may cause the arteries to become narrow or stiff. Having untreated or uncontrolled hypertension can cause heart attack, stroke, kidney disease, and other problems. What are blood pressure readings? A blood pressure reading consists of a higher number over a lower number. Ideally, your blood pressure should be below 120/80. The first ("top") number is called the systolic pressure. It is a measure of the pressure in your arteries as your heart beats. The second ("bottom") number is called the diastolic pressure. It is a measure of the pressure in your arteries as the heart relaxes. What does my blood pressure reading mean? Blood pressure is classified into four stages. Based on your blood pressure reading, your health care provider may use the following stages to determine what type of treatment you need, if any. Systolic pressure and diastolic pressure are measured in a unit called mm Hg. Normal  Systolic pressure: below 120.  Diastolic pressure: below 80. Elevated  Systolic pressure: 120-129.  Diastolic pressure: below 80. Hypertension stage 1  Systolic pressure: 130-139.  Diastolic pressure: 80-89. Hypertension stage 2  Systolic pressure: 140 or above.  Diastolic pressure: 90 or above. What health risks are associated with hypertension? Managing your hypertension is an important responsibility. Uncontrolled hypertension can lead to:  A heart attack.  A stroke.  A weakened blood vessel (aneurysm).  Heart failure.  Kidney damage.  Eye damage.  Metabolic syndrome.  Memory and concentration problems.  What changes can I make to manage my  hypertension? Hypertension can be managed by making lifestyle changes and possibly by taking medicines. Your health care provider will help you make a plan to bring your blood pressure within a normal range. Eating and drinking  Eat a diet that is high in fiber and potassium, and low in salt (sodium), added sugar, and fat. An example eating plan is called the DASH (Dietary Approaches to Stop Hypertension) diet. To eat this way: ? Eat plenty of fresh fruits and vegetables. Try to fill half of your plate at each meal with fruits and vegetables. ? Eat whole grains, such as whole wheat pasta, brown rice, or whole grain bread. Fill about one quarter of your plate with whole grains. ? Eat low-fat diary products. ? Avoid fatty cuts of meat, processed or cured meats, and poultry with skin. Fill about one quarter of your plate with lean proteins such as fish, chicken without skin, beans, eggs, and tofu. ? Avoid premade and processed foods. These tend to be higher in sodium, added sugar, and fat.  Reduce your daily sodium intake. Most people with hypertension should eat less than 1,500 mg of sodium a day.  Limit alcohol intake to no more than 1 drink a day for nonpregnant women and 2 drinks a day for men. One drink equals 12 oz of beer, 5 oz of wine, or 1 oz of hard liquor. Lifestyle  Work with your health care provider to maintain a healthy body weight, or to lose weight. Ask what an ideal weight is for you.  Get at least 30 minutes of exercise that causes your heart to beat faster (aerobic exercise) most days of the week. Activities may include walking, swimming, or biking.  Include exercise   to strengthen your muscles (resistance exercise), such as weight lifting, as part of your weekly exercise routine. Try to do these types of exercises for 30 minutes at least 3 days a week.  Do not use any products that contain nicotine or tobacco, such as cigarettes and e-cigarettes. If you need help quitting, ask  your health care provider.  Control any long-term (chronic) conditions you have, such as high cholesterol or diabetes. Monitoring  Monitor your blood pressure at home as told by your health care provider. Your personal target blood pressure may vary depending on your medical conditions, your age, and other factors.  Have your blood pressure checked regularly, as often as told by your health care provider. Working with your health care provider  Review all the medicines you take with your health care provider because there may be side effects or interactions.  Talk with your health care provider about your diet, exercise habits, and other lifestyle factors that may be contributing to hypertension.  Visit your health care provider regularly. Your health care provider can help you create and adjust your plan for managing hypertension. Will I need medicine to control my blood pressure? Your health care provider may prescribe medicine if lifestyle changes are not enough to get your blood pressure under control, and if:  Your systolic blood pressure is 130 or higher.  Your diastolic blood pressure is 80 or higher.  Take medicines only as told by your health care provider. Follow the directions carefully. Blood pressure medicines must be taken as prescribed. The medicine does not work as well when you skip doses. Skipping doses also puts you at risk for problems. Contact a health care provider if:  You think you are having a reaction to medicines you have taken.  You have repeated (recurrent) headaches.  You feel dizzy.  You have swelling in your ankles.  You have trouble with your vision. Get help right away if:  You develop a severe headache or confusion.  You have unusual weakness or numbness, or you feel faint.  You have severe pain in your chest or abdomen.  You vomit repeatedly.  You have trouble breathing. Summary  Hypertension is when the force of blood pumping through  your arteries is too strong. If this condition is not controlled, it may put you at risk for serious complications.  Your personal target blood pressure may vary depending on your medical conditions, your age, and other factors. For most people, a normal blood pressure is less than 120/80.  Hypertension is managed by lifestyle changes, medicines, or both. Lifestyle changes include weight loss, eating a healthy, low-sodium diet, exercising more, and limiting alcohol. This information is not intended to replace advice given to you by your health care provider. Make sure you discuss any questions you have with your health care provider. Document Released: 12/19/2011 Document Revised: 02/22/2016 Document Reviewed: 02/22/2016 [employer] Interactive Patient Education  2018 [employer]  DASH Eating Plan DASH stands for "Dietary Approaches to Stop Hypertension." The DASH eating plan is a healthy eating plan that has been shown to reduce high blood pressure (hypertension). It may also reduce your risk for type 2 diabetes, heart disease, and stroke. The DASH eating plan may also help with weight loss. What are tips for following this plan? General guidelines  Avoid eating more than 2,300 mg (milligrams) of salt (sodium) a day. If you have hypertension, you may need to reduce your sodium intake to 1,500 mg a day.  Limit alcohol intake   to no more than 1 drink a day for nonpregnant women and 2 drinks a day for men. One drink equals 12 oz of beer, 5 oz of wine, or 1 oz of hard liquor.  Work with your health care provider to maintain a healthy body weight or to lose weight. Ask what an ideal weight is for you.  Get at least 30 minutes of exercise that causes your heart to beat faster (aerobic exercise) most days of the week. Activities may include walking, swimming, or biking.  Work with your health care provider or diet and nutrition specialist (dietitian) to adjust your eating plan to your individual  calorie needs. Reading food labels  Check food labels for the amount of sodium per serving. Choose foods with less than 5 percent of the Daily Value of sodium. Generally, foods with less than 300 mg of sodium per serving fit into this eating plan.  To find whole grains, look for the word "whole" as the first word in the ingredient list. Shopping  Buy products labeled as "low-sodium" or "no salt added."  Buy fresh foods. Avoid canned foods and premade or frozen meals. Cooking  Avoid adding salt when cooking. Use salt-free seasonings or herbs instead of table salt or sea salt. Check with your health care provider or pharmacist before using salt substitutes.  Do not fry foods. Cook foods using healthy methods such as baking, boiling, grilling, and broiling instead.  Cook with heart-healthy oils, such as olive, canola, soybean, or sunflower oil. Meal planning   Eat a balanced diet that includes: ? 5 or more servings of fruits and vegetables each day. At each meal, try to fill half of your plate with fruits and vegetables. ? Up to 6-8 servings of whole grains each day. ? Less than 6 oz of lean meat, poultry, or fish each day. A 3-oz serving of meat is about the same size as a deck of cards. One egg equals 1 oz. ? 2 servings of low-fat dairy each day. ? A serving of nuts, seeds, or beans 5 times each week. ? Heart-healthy fats. Healthy fats called Omega-3 fatty acids are found in foods such as flaxseeds and coldwater fish, like sardines, salmon, and mackerel.  Limit how much you eat of the following: ? Canned or prepackaged foods. ? Food that is high in trans fat, such as fried foods. ? Food that is high in saturated fat, such as fatty meat. ? Sweets, desserts, sugary drinks, and other foods with added sugar. ? Full-fat dairy products.  Do not salt foods before eating.  Try to eat at least 2 vegetarian meals each week.  Eat more home-cooked food and less restaurant, buffet, and fast  food.  When eating at a restaurant, ask that your food be prepared with less salt or no salt, if possible. What foods are recommended? The items listed may not be a complete list. Talk with your dietitian about what dietary choices are best for you. Grains Whole-grain or whole-wheat bread. Whole-grain or whole-wheat pasta. Brown rice. Oatmeal. Quinoa. Bulgur. Whole-grain and low-sodium cereals. Pita bread. Low-fat, low-sodium crackers. Whole-wheat flour tortillas. Vegetables Fresh or frozen vegetables (raw, steamed, roasted, or grilled). Low-sodium or reduced-sodium tomato and vegetable juice. Low-sodium or reduced-sodium tomato sauce and tomato paste. Low-sodium or reduced-sodium canned vegetables. Fruits All fresh, dried, or frozen fruit. Canned fruit in natural juice (without added sugar). Meat and other protein foods Skinless chicken or turkey. Ground chicken or turkey. Pork with fat trimmed off. Fish   and seafood. Egg whites. Dried beans, peas, or lentils. Unsalted nuts, nut butters, and seeds. Unsalted canned beans. Lean cuts of beef with fat trimmed off. Low-sodium, lean deli meat. Dairy Low-fat (1%) or fat-free (skim) milk. Fat-free, low-fat, or reduced-fat cheeses. Nonfat, low-sodium ricotta or cottage cheese. Low-fat or nonfat yogurt. Low-fat, low-sodium cheese. Fats and oils Soft margarine without trans fats. Vegetable oil. Low-fat, reduced-fat, or light mayonnaise and salad dressings (reduced-sodium). Canola, safflower, olive, soybean, and sunflower oils. Avocado. Seasoning and other foods Herbs. Spices. Seasoning mixes without salt. Unsalted popcorn and pretzels. Fat-free sweets. What foods are not recommended? The items listed may not be a complete list. Talk with your dietitian about what dietary choices are best for you. Grains Baked goods made with fat, such as croissants, muffins, or some breads. Dry pasta or rice meal packs. Vegetables Creamed or fried vegetables. Vegetables  in a cheese sauce. Regular canned vegetables (not low-sodium or reduced-sodium). Regular canned tomato sauce and paste (not low-sodium or reduced-sodium). Regular tomato and vegetable juice (not low-sodium or reduced-sodium). Pickles. Olives. Fruits Canned fruit in a light or heavy syrup. Fried fruit. Fruit in cream or butter sauce. Meat and other protein foods Fatty cuts of meat. Ribs. Fried meat. Bacon. Sausage. Bologna and other processed lunch meats. Salami. Fatback. Hotdogs. Bratwurst. Salted nuts and seeds. Canned beans with added salt. Canned or smoked fish. Whole eggs or egg yolks. Chicken or turkey with skin. Dairy Whole or 2% milk, cream, and half-and-half. Whole or full-fat cream cheese. Whole-fat or sweetened yogurt. Full-fat cheese. Nondairy creamers. Whipped toppings. Processed cheese and cheese spreads. Fats and oils Butter. Stick margarine. Lard. Shortening. Ghee. Bacon fat. Tropical oils, such as coconut, palm kernel, or palm oil. Seasoning and other foods Salted popcorn and pretzels. Onion salt, garlic salt, seasoned salt, table salt, and sea salt. Worcestershire sauce. Tartar sauce. Barbecue sauce. Teriyaki sauce. Soy sauce, including reduced-sodium. Steak sauce. Canned and packaged gravies. Fish sauce. Oyster sauce. Cocktail sauce. Horseradish that you find on the shelf. Ketchup. Mustard. Meat flavorings and tenderizers. Bouillon cubes. Hot sauce and Tabasco sauce. Premade or packaged marinades. Premade or packaged taco seasonings. Relishes. Regular salad dressings. Where to find more information:  National Heart, [employer], and [employer] Institute: www.nhlbi.nih.gov  American Heart Association: www.heart.org Summary  The DASH eating plan is a healthy eating plan that has been shown to reduce high blood pressure (hypertension). It may also reduce your risk for type 2 diabetes, heart disease, and stroke.  With the DASH eating plan, you should limit salt (sodium) intake to 2,300 mg a  day. If you have hypertension, you may need to reduce your sodium intake to 1,500 mg a day.  When on the DASH eating plan, aim to eat more fresh fruits and vegetables, whole grains, lean proteins, low-fat dairy, and heart-healthy fats.  Work with your health care provider or diet and nutrition specialist (dietitian) to adjust your eating plan to your individual calorie needs. This information is not intended to replace advice given to you by your health care provider. Make sure you discuss any questions you have with your health care provider. Document Released: 03/15/2011 Document Revised: 03/19/2016 Document Reviewed: 03/19/2016 [employer] Interactive Patient Education  2018 [employer]  

## 2017-10-08 NOTE — Progress Notes (Signed)
Impression and Recommendations:    1. Prediabetes   2. Essential hypertension   3. Obesity, Class I, BMI 30-34.9   4. Elevated LDL cholesterol level   5. Elevated HDL   6. Vitamin D insufficiency    1. Prediabetes - A1c is down today to 5.3 from 5.7.  - Counseled patient on dietary and lifestyle modifications as first line.  Importance of low carb/ketogenic diet discussed with patient in addition to regular exercise.   - Continue to pursue lifestyle modifications to take charge of her health.  2. Hypertension - Continue current medications.  No changes made today.  - Lifestyle changes such as dash diet and engaging in a regular exercise program discussed with patient.  Educational handouts provided  - Ambulatory BP monitoring encouraged. Keep log and bring in next OV  3. Cholesterol - Dietary changes such as low saturated & trans fat and low carb/ ketogenic diets discussed with patient.  Encouraged regular exercise and weight loss when appropriate.   4. BMI Counseling Explained to patient what BMI refers to, and what it means medically.    Told patient to think about it as a "medical risk stratification measurement" and how increasing BMI is associated with increasing risk/ or worsening state of various diseases such as hypertension, hyperlipidemia, diabetes, premature OA, depression etc.  American Heart Association guidelines for healthy diet, basically Mediterranean diet, and exercise guidelines of 30 minutes 5 days per week or more discussed in detail.  Health counseling performed.  All questions answered.  5. Lifestyle & Preventative Health Maintenance - Advised patient to continue working toward exercising to improve health.    - Continue to obtain daily physical activity, 30-45 minutes a day.  Recommended that the patient eventually strive for at least 150 minutes of moderate cardiovascular activity per week according to guidelines established by the [facility].   -  Healthy dietary habits encouraged, including low-carb, and high amounts of lean protein in diet.   - Patient should also consume adequate amounts of water - half of body weight in oz of water per day. - Continue drinking at least one gallon of water per day.   Education and routine counseling performed. Handouts provided.  6. Follow-Up - Return for a CPE with blood work around November.  Orders Placed This Encounter  Procedures  . POCT glycosylated hemoglobin (Hb A1C)    Meds ordered this encounter  Medications  . olmesartan (BENICAR) 40 MG tablet    Sig: Take 1 tablet (40 mg total) by mouth daily.    Dispense:  90 tablet    Refill:  1  . metoprolol succinate (TOPROL-XL) 25 MG 24 hr tablet    Sig: Take 1 tablet (25 mg total) by mouth daily.    Dispense:  90 tablet    Refill:  1  . hydrochlorothiazide (HYDRODIURIL) 12.5 MG tablet    Sig: Take 1 tablet (12.5 mg total) by mouth daily.    Dispense:  90 tablet    Refill:  1    The patient was counseled, risk factors were discussed, anticipatory guidance given.  Gross side effects, risk and benefits, and alternatives of medications discussed with patient.  Patient is aware that all medications have potential side effects and we are unable to predict every side effect or drug-drug interaction that may occur.  Expresses verbal understanding and consents to current therapy plan and treatment regimen.  Return in about 4 months (around 02/08/2018) for For fasting blood work and OV  a couple days later.  Please see AVS handed out to patient at the end of our visit for further patient instructions/ counseling done pertaining to today's office visit.    Note: This document was prepared using Dragon voice recognition software and may include unintentional dictation errors.  This document serves as a record of services personally performed by Thomasene Lot, DO. It was created on her behalf by Peggye Fothergill, a trained medical scribe.  The creation of this record is based on the scribe's personal observations and the provider's statements to them.   I have reviewed the above medical documentation for accuracy and completeness and I concur.  Thomasene Lot 10/09/17 12:51 PM    Subjective:    HPI: Doris Harris is a 72 y.o. female who presents to [facility] at [facility] today for follow up for HTN.    Last visit, blood pressure medications were changed around.  Notes that she's been working on everything.  Her A1c went down to 5.4 from 5.7.  Patient was going to the [facility] three to four days per week.  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  Notes that she drinks about a gallon of water per day.  Patient plans to start water aerobics soon.  HTN:  -  Her blood pressure has not been perfectly controlled at home.  At home, her blood pressure's been running on average 139/84.  - Patient reports good compliance with blood pressure medications. She's been taking metoprolol and Benicar at night.  - Denies medication S-E   - Smoking Status noted   - She denies new onset of: chest pain, exercise intolerance, shortness of breath, dizziness, visual changes, headache, lower extremity swelling or claudication.   Last 3 blood pressure readings in our office are as follows: BP Readings from Last 3 Encounters:  10/08/17 140/85  08/14/17 129/83  08/13/17 (!) 159/99    Pulse Readings from Last 3 Encounters:  10/08/17 68  08/14/17 66  08/13/17 60    Filed Weights   10/08/17 1525  Weight: 225 lb (102.1 kg)   Lab Results  Component Value Date   HGBA1C 5.3 10/08/2017   HGBA1C 5.7 (H) 02/18/2017   HGBA1C 6.0 10/17/2016     Patient Care Team    Relationship Specialty Notifications Start End  Thomasene Lot, DO PCP - General Family Medicine   01/26/16      Lab Results  Component Value Date   CREATININE 0.97 02/18/2017   BUN 13 02/18/2017   NA 141 02/18/2017   K 4.1 02/18/2017   CL 98 02/18/2017   CO2 28 02/18/2017    Lab Results  Component Value Date   CHOL 229 (H) 02/18/2017   CHOL 223 (H) 03/13/2016   CHOL 211 (H) 02/22/2015    Lab Results  Component Value Date   HDL 89 02/18/2017   HDL 78 03/13/2016   HDL 77.90 02/22/2015    Lab Results  Component Value Date   LDLCALC 124 (H) 02/18/2017   LDLCALC 129 (H) 03/13/2016   LDLCALC 112 (H) 02/22/2015    Lab Results  Component Value Date   TRIG 78 02/18/2017   TRIG 81 03/13/2016   TRIG 105.0 02/22/2015    Lab Results  Component Value Date   CHOLHDL 2.6 02/18/2017   CHOLHDL 2.9 03/13/2016   CHOLHDL 3  02/22/2015    Lab Results  Component Value Date   LDLDIRECT 120.2 09/05/2012   LDLDIRECT 110.8 07/17/2011   LDLDIRECT 119.6 02/01/2010   ===================================================================   Patient Active Problem List   Diagnosis Date Noted  . Obesity, Class I, BMI 30-34.9 01/25/2016    Priority: High  . Prediabetes 01/25/2016    Priority: High  . Elevated LDL cholesterol level 01/25/2016    Priority: High  . Essential hypertension 12/02/2014    Priority: High  . Elevated HDL 10/08/2017  . Vitamin D insufficiency 10/08/2017  . Patellofemoral arthritis of left knee 08/14/2017  . Acute pain of left knee 08/14/2017  . Screening for colon cancer 09/01/2013  . Counseling on health promotion and disease prevention 09/01/2013  . URINALYSIS, ABNORMAL 02/08/2010     Past Medical History:  Diagnosis Date  . Hypertension   . IFG (impaired fasting glucose)    x1  . Obesity      Past Surgical History:  Procedure Laterality Date  . MM BREAST STEREO BX*L*R/S    . TOTAL ABDOMINAL HYSTERECTOMY  1997   fibroid  . uterine polyps    . WRIST FRACTURE SURGERY  2000     Family History  Problem Relation Age of Onset  .  Hypertension Mother   . Hyperlipidemia Mother   . Hypertension Father   . Hypertension Brother   . Diabetes Maternal Grandmother   . Coronary artery disease Other   . Hypertension Other   . Asthma Other   . Hypertension Brother   . Hypertension Sister   . Healthy Son   . Hypertension Sister   . Healthy Sister   . Hypertension Brother   . Healthy Brother   . Healthy Brother   . Healthy Brother   . Heart attack Brother      Social History   Substance and Sexual Activity  Drug Use No  ,  Social History   Substance and Sexual Activity  Alcohol Use No  ,  Social History   Tobacco Use  Smoking Status Never Smoker  Smokeless Tobacco Never Used  ,    No current outpatient medications on file prior to visit.   No current facility-administered medications on file prior to visit.      Allergies  Allergen Reactions  . Amlodipine Nausea And Vomiting  . Ibuprofen     REACTION: unspecified  . Lisinopril Cough  . Benazepril Rash  . Valsartan-Hydrochlorothiazide Rash     Review of Systems:   General:  Denies fever, chills Optho/Auditory:   Denies visual changes, blurred vision Respiratory:   Denies SOB, cough, wheeze, DIB  Cardiovascular:   Denies chest pain, palpitations, painful respirations Gastrointestinal:   Denies nausea, vomiting, diarrhea.  Endocrine:     Denies new hot or cold intolerance Musculoskeletal:  Denies joint swelling, gait issues, or new unexplained myalgias/ arthralgias Skin:  Denies rash, suspicious lesions  Neurological:    Denies dizziness, unexplained weakness, numbness  Psychiatric/Behavioral:   Denies mood changes  Objective:    Blood pressure 140/85, pulse 68, height 5\' 8"  (1.727 m), weight 225 lb (102.1 kg), SpO2 100 %.  Body mass index is 34.21 kg/m.  General: Well Developed, well nourished, and in no acute distress.  HEENT: Normocephalic, atraumatic, pupils equal round reactive to light, neck supple, No carotid bruits, no  JVD Skin: Warm and dry, cap RF less 2 sec Cardiac: Regular rate and rhythm, S1, S2 WNL's, no murmurs rubs or gallops Respiratory: ECTA B/L, Not using  accessory muscles, speaking in full sentences. NeuroM-Sk: Ambulates w/o assistance, moves ext * 4 w/o difficulty, sensation grossly intact.  Ext: scant edema b/l lower ext Psych: No HI/SI, judgement and insight good, Euthymic mood. Full Affect.

## 2017-10-21 ENCOUNTER — Telehealth: Payer: Self-pay | Admitting: Family Medicine

## 2017-10-21 ENCOUNTER — Other Ambulatory Visit: Payer: Self-pay

## 2017-10-21 DIAGNOSIS — I10 Essential (primary) hypertension: Secondary | ICD-10-CM

## 2017-10-21 MED ORDER — OLMESARTAN MEDOXOMIL 40 MG PO TABS: 40.0000 mg | ORAL_TABLET | Freq: Every day | ORAL | 0 refills | Status: DC

## 2017-10-21 NOTE — Telephone Encounter (Signed)
Patient called [facility] to check her olmesartan refill and was told that they are out of stock and it could be a few weeks before she gets her refill. She has already been without this med almost a week and is requesting a new refill request be sent to [facility]. Please advise

## 2017-10-21 NOTE — Telephone Encounter (Signed)
Sent refills in for the patient to local pharmacy.  Patient notified. MPulliam, CMA/RT(R)

## 2017-10-21 NOTE — Telephone Encounter (Signed)
Patient called and states that mail order pharmacy is out of Benicar and patient would like refill sent to local pharmacy.  Refill sent over to [facility]. MPulliam, CMA/RT(R)

## 2017-11-09 ENCOUNTER — Other Ambulatory Visit: Payer: Self-pay | Admitting: Family Medicine

## 2017-11-09 DIAGNOSIS — I10 Essential (primary) hypertension: Secondary | ICD-10-CM

## 2018-01-10 ENCOUNTER — Other Ambulatory Visit: Payer: Self-pay | Admitting: Family Medicine

## 2018-01-10 DIAGNOSIS — I10 Essential (primary) hypertension: Secondary | ICD-10-CM

## 2018-02-11 ENCOUNTER — Other Ambulatory Visit: Payer: Self-pay

## 2018-02-11 ENCOUNTER — Other Ambulatory Visit (INDEPENDENT_AMBULATORY_CARE_PROVIDER_SITE_OTHER): Payer: Medicare Other

## 2018-02-11 DIAGNOSIS — I10 Essential (primary) hypertension: Secondary | ICD-10-CM

## 2018-02-11 DIAGNOSIS — E559 Vitamin D deficiency, unspecified: Secondary | ICD-10-CM | POA: Diagnosis not present

## 2018-02-11 DIAGNOSIS — R7303 Prediabetes: Secondary | ICD-10-CM | POA: Diagnosis not present

## 2018-02-11 DIAGNOSIS — E7889 Other lipoprotein metabolism disorders: Secondary | ICD-10-CM

## 2018-02-11 DIAGNOSIS — E7800 Pure hypercholesterolemia, unspecified: Secondary | ICD-10-CM

## 2018-02-11 DIAGNOSIS — Z0000 Encounter for general adult medical examination without abnormal findings: Secondary | ICD-10-CM

## 2018-02-12 LAB — COMPREHENSIVE METABOLIC PANEL
A/G RATIO: 1.2 (ref 1.2–2.2)
ALBUMIN: 4.1 g/dL (ref 3.5–4.8)
ALT: 16 IU/L (ref 0–32)
AST: 19 IU/L (ref 0–40)
Alkaline Phosphatase: 78 IU/L (ref 39–117)
BUN / CREAT RATIO: 15 (ref 12–28)
BUN: 13 mg/dL (ref 8–27)
Bilirubin Total: 0.5 mg/dL (ref 0.0–1.2)
CHLORIDE: 101 mmol/L (ref 96–106)
CO2: 24 mmol/L (ref 20–29)
Calcium: 9.6 mg/dL (ref 8.7–10.3)
Creatinine, Ser: 0.87 mg/dL (ref 0.57–1.00)
GFR calc non Af Amer: 67 mL/min/{1.73_m2} (ref >59)
GFR, EST AFRICAN AMERICAN: 78 mL/min/{1.73_m2} (ref >59)
Globulin, Total: 3.4 g/dL (ref 1.5–4.5)
Glucose: 110 mg/dL — ABNORMAL HIGH (ref 65–99)
POTASSIUM: 4.2 mmol/L (ref 3.5–5.2)
SODIUM: 140 mmol/L (ref 134–144)
TOTAL PROTEIN: 7.5 g/dL (ref 6.0–8.5)

## 2018-02-12 LAB — VITAMIN D 25 HYDROXY (VIT D DEFICIENCY, FRACTURES): Vit D, 25-Hydroxy: 41.7 ng/mL (ref 30.0–100.0)

## 2018-02-12 LAB — CBC WITH DIFFERENTIAL/PLATELET
BASOS ABS: 0.1 10*3/uL (ref 0.0–0.2)
Basos: 1 %
EOS (ABSOLUTE): 0.1 10*3/uL (ref 0.0–0.4)
Eos: 2 %
Hematocrit: 38.5 % (ref 34.0–46.6)
Hemoglobin: 13.2 g/dL (ref 11.1–15.9)
Immature Grans (Abs): 0 10*3/uL (ref 0.0–0.1)
Immature Granulocytes: 0 %
LYMPHS ABS: 2.9 10*3/uL (ref 0.7–3.1)
Lymphs: 47 %
MCH: 30.6 pg (ref 26.6–33.0)
MCHC: 34.3 g/dL (ref 31.5–35.7)
MCV: 89 fL (ref 79–97)
Monocytes Absolute: 0.5 10*3/uL (ref 0.1–0.9)
Monocytes: 8 %
Neutrophils Absolute: 2.5 10*3/uL (ref 1.4–7.0)
Neutrophils: 42 %
PLATELETS: 206 10*3/uL (ref 150–450)
RBC: 4.32 x10E6/uL (ref 3.77–5.28)
RDW: 12.8 % (ref 12.3–15.4)
WBC: 6 10*3/uL (ref 3.4–10.8)

## 2018-02-12 LAB — HEMOGLOBIN A1C
Est. average glucose Bld gHb Est-mCnc: 117 mg/dL
Hgb A1c MFr Bld: 5.7 % — ABNORMAL HIGH (ref 4.8–5.6)

## 2018-02-12 LAB — T4, FREE: FREE T4: 1.38 ng/dL (ref 0.82–1.77)

## 2018-02-12 LAB — LIPID PANEL
Chol/HDL Ratio: 2.7 ratio (ref 0.0–4.4)
Cholesterol, Total: 238 mg/dL — ABNORMAL HIGH (ref 100–199)
HDL: 87 mg/dL (ref >39)
LDL Calculated: 133 mg/dL — ABNORMAL HIGH (ref 0–99)
TRIGLYCERIDES: 91 mg/dL (ref 0–149)
VLDL CHOLESTEROL CAL: 18 mg/dL (ref 5–40)

## 2018-02-12 LAB — TSH: TSH: 1.47 u[IU]/mL (ref 0.450–4.500)

## 2018-05-19 ENCOUNTER — Other Ambulatory Visit: Payer: Self-pay | Admitting: Family Medicine

## 2018-05-19 DIAGNOSIS — I10 Essential (primary) hypertension: Secondary | ICD-10-CM

## 2018-05-20 ENCOUNTER — Telehealth: Payer: Self-pay | Admitting: Family Medicine

## 2018-05-20 NOTE — Telephone Encounter (Signed)
-----   Message from [place] Crane, CMA sent at 05/19/2018 11:56 AM EST ----- Patient is due for follow up, please call the patient to make an appointment.  30 day supply of medication sent in for patient. Thanks. MPulliam, CMA/RT(R)

## 2018-05-20 NOTE — Telephone Encounter (Signed)
Left patient message to call office to set up provider required OV  & that only a 30dys supply of Rx sent to pharmacy- --glh

## 2018-07-29 ENCOUNTER — Other Ambulatory Visit: Payer: Self-pay | Admitting: Family Medicine

## 2018-07-29 DIAGNOSIS — I10 Essential (primary) hypertension: Secondary | ICD-10-CM

## 2018-07-30 NOTE — Telephone Encounter (Signed)
Patient is due for follow up.  Called patient left message to call back. MPulliam, CMA/RT(R)

## 2018-08-12 ENCOUNTER — Other Ambulatory Visit: Payer: Self-pay

## 2018-08-12 ENCOUNTER — Ambulatory Visit (INDEPENDENT_AMBULATORY_CARE_PROVIDER_SITE_OTHER): Payer: Medicare Other | Admitting: Family Medicine

## 2018-08-12 ENCOUNTER — Encounter: Payer: Self-pay | Admitting: Family Medicine

## 2018-08-12 VITALS — BP 155/88 | HR 75 | Ht 68.0 in | Wt 230.0 lb

## 2018-08-12 DIAGNOSIS — E7800 Pure hypercholesterolemia, unspecified: Secondary | ICD-10-CM

## 2018-08-12 DIAGNOSIS — Z7189 Other specified counseling: Secondary | ICD-10-CM

## 2018-08-12 DIAGNOSIS — Z79899 Other long term (current) drug therapy: Secondary | ICD-10-CM

## 2018-08-12 DIAGNOSIS — I10 Essential (primary) hypertension: Secondary | ICD-10-CM

## 2018-08-12 DIAGNOSIS — E559 Vitamin D deficiency, unspecified: Secondary | ICD-10-CM | POA: Diagnosis not present

## 2018-08-12 DIAGNOSIS — E7889 Other lipoprotein metabolism disorders: Secondary | ICD-10-CM | POA: Diagnosis not present

## 2018-08-12 DIAGNOSIS — R7303 Prediabetes: Secondary | ICD-10-CM

## 2018-08-12 MED ORDER — ATORVASTATIN CALCIUM 10 MG PO TABS: 10.0000 mg | ORAL_TABLET | Freq: Every day | ORAL | 3 refills | Status: DC

## 2018-08-12 MED ORDER — OLMESARTAN MEDOXOMIL 40 MG PO TABS: 40.0000 mg | ORAL_TABLET | Freq: Every day | ORAL | 1 refills | Status: DC

## 2018-08-12 MED ORDER — METOPROLOL SUCCINATE ER 25 MG PO TB24: 25.0000 mg | ORAL_TABLET | Freq: Every day | ORAL | 1 refills | Status: DC

## 2018-08-12 MED ORDER — HYDROCHLOROTHIAZIDE 12.5 MG PO TABS: 12.5000 mg | ORAL_TABLET | Freq: Every day | ORAL | 1 refills | Status: DC

## 2018-08-12 NOTE — Progress Notes (Signed)
Virtual / live video office visit note for [employer], D.O- Primary Care Physician at [facility]   I connected with current patient today and beyond visually recognizing the correct individual, I verified that I am speaking with the correct person using two identifiers.  . Location of the patient: Home . Location of the provider: Office Only the patient (+/- their family members at pt's discretion) and myself were participating in the encounter    - This visit type was conducted due to national recommendations for restrictions regarding the COVID-19 Pandemic (e.g. social distancing) in an effort to limit this patient's exposure and mitigate transmission in our community.  This format is felt to be most appropriate for this patient at this time.   - The patient did have access to video technology today - No physical exam could be performed with this format, beyond that communicated to [name] by the patient/ family members as noted.   - Additionally my office staff/ schedulers discussed with the patient that there may be a monetary charge related to this service, depending on patient's medical insurance.   The patient expressed understanding, and agreed to proceed.      History of Present Illness:   1. HTN HPI:  -  Her blood pressure has been controlled at home.  Pt is checking it at home.  Per patient she checks it about every other day or so.  Runs 062 systolically over 70, and occasionally over 80.  Patient states is rarely ever over 376 systolic or 80 diastolically.  - Patient reports good compliance with blood pressure medications  - Denies medication S-E  - She denies new onset of: chest pain, exercise intolerance, shortness of breath, dizziness, visual changes, headache, lower extremity swelling or claudication.   Today their BP is BP: (!) 155/88   Last 3 blood pressure readings in our office are as follows: BP Readings from Last 3 Encounters:  08/12/18 (!) 155/88   10/08/17 140/85  08/14/17 129/83    Filed Weights   08/12/18 1537  Weight: 230 lb (104.3 kg)     2. CHOL HPI:   -  She  is currently managed with: Patient has declined medication since I have met her back in fall 2017.  Txmnt compliance-patient has been very good with diet and regular exercise.  She walks for about an hour most days of the week.  They take off the weekends.  They do not walk if the weather is poor however.  -Per patient her mom who is 32 something years old is on Lipitor as well.  She has congestive heart failure.  Patient does not want to have a heart attack or stroke.  Finally today she appeared open to discussion regarding statin medications.  We did review her cholesterol panel 6 months ago and all the rest of her labs.  The 10-year ASCVD risk score Mikey Bussing DC Brooke Bonito., et al., 2013) is: 21.6%   Values used to calculate the score:     Age: 73 years     Sex: Female     Is Non-Hispanic African American: Yes     Diabetic: No     Tobacco smoker: No     Systolic Blood Pressure: 283 mmHg     Is BP treated: Yes     HDL Cholesterol: 87 mg/dL     Total Cholesterol: 238 mg/dL   Last lipid panel as follows:  Lab Results  Component Value Date  CHOL 238 (H) 02/11/2018   HDL 87 02/11/2018   LDLCALC 133 (H) 02/11/2018   LDLDIRECT 120.2 09/05/2012   TRIG 91 02/11/2018   CHOLHDL 2.7 02/11/2018    Hepatic Function Latest Ref Rng & Units 02/11/2018 02/18/2017 03/13/2016  Total Protein 6.0 - 8.5 g/dL 7.5 7.8 7.5  Albumin 3.5 - 4.8 g/dL 4.1 4.4 3.9  AST 0 - 40 IU/L 19 23 19   ALT 0 - 32 IU/L 16 24 16   Alk Phosphatase 39 - 117 IU/L 78 85 70  Total Bilirubin 0.0 - 1.2 mg/dL 0.5 0.8 0.7  Bilirubin, Direct 0.0 - 0.3 mg/dL - - -      3. Obesity: Patient complains of weight gain.  Last year around this time she was approximately 8-10 pounds lighter.  She states she has been less active with the Covid-19 and all.   4. Prediabetes: Reminded patient that prediabetes state gets  worse with inactivity, weight gain etc.  Last time was checked 6 months ago was A1c 5.7 and very well controlled.  Prior to that it was 5.3.      Impression and Recommendations:    1. Elevated LDL cholesterol level   2. Essential hypertension   3. Elevated HDL   4. Prediabetes   5. Vitamin D insufficiency   6. Counseling on health promotion and disease prevention   7. On statin therapy       - As part of my medical decision making, I reviewed the following data within the [facility] History obtained from pt /family, CMA notes reviewed and incorporated if applicable, Labs reviewed, Radiograph/ tests reviewed if applicable and OV notes from prior OV's with me, as well as other specialists she/he has seen since seeing me last, were all reviewed and used in my medical decision making process today.   - Additionally, discussion had with patient regarding txmnt plan, their biases about that plan etc were used in my medical decision making today.   - The patient agreed with the plan and demonstrated an understanding of the instructions.   No barriers to understanding were identified.   - Red flag symptoms and signs discussed in detail.  Patient expressed understanding regarding what to do in case of emergency\ urgent symptoms.  The patient was advised to call back or seek an in-person evaluation if the symptoms worsen or if the condition fails to improve as anticipated.   Return for 3-4 mo OV-started Lipitor today; will need blood work 3-5 d prior to appt..    Orders Placed This Encounter  Procedures  . Hemoglobin A1c  . Comprehensive metabolic panel  . Lipid panel  . VITAMIN D 25 Hydroxy (Vit-D Deficiency, Fractures)    Meds ordered this encounter  Medications  . hydrochlorothiazide (HYDRODIURIL) 12.5 MG tablet    Sig: Take 1 tablet (12.5 mg total) by mouth daily.    Dispense:  90 tablet    Refill:  1  . metoprolol succinate (TOPROL-XL) 25 MG 24 hr tablet    Sig:  Take 1 tablet (25 mg total) by mouth daily.    Dispense:  90 tablet    Refill:  1  . olmesartan (BENICAR) 40 MG tablet    Sig: Take 1 tablet (40 mg total) by mouth daily.    Dispense:  90 tablet    Refill:  1  . atorvastatin (LIPITOR) 10 MG tablet    Sig: Take 1 tablet (10 mg total) by mouth at bedtime.    Dispense:  90 tablet    Refill:  3    Medications Discontinued During This Encounter  Medication Reason  . hydrochlorothiazide (HYDRODIURIL) 12.5 MG tablet Reorder  . metoprolol succinate (TOPROL-XL) 25 MG 24 hr tablet Reorder  . olmesartan (BENICAR) 40 MG tablet Reorder      I provided 20+  minutes of non-face-to-face time during this encounter,with over 50% of the time in direct counseling on patients medical conditions/ medical concerns.  Additional time was spent with charting and coordination of care after the actual visit commenced.   Note:  This note was prepared with assistance of Dragon voice recognition software. Occasional wrong-word or sound-a-like substitutions may have occurred due to the inherent limitations of voice recognition software.  Mellody Dance, DO     Patient Care Team    Relationship Specialty Notifications Start End  Mellody Dance, DO PCP - General Family Medicine  01/26/16     -Vitals obtained; medications/ allergies reconciled;  personal medical, social, Sx etc.histories were updated by CMA, reviewed by me and are reflected in chart  Patient Active Problem List   Diagnosis Date Noted  . Obesity, Class I, BMI 30-34.9 01/25/2016    Priority: High  . Prediabetes 01/25/2016    Priority: High  . Elevated LDL cholesterol level 01/25/2016    Priority: High  . Essential hypertension 12/02/2014    Priority: High  . Elevated HDL 10/08/2017  . Vitamin D insufficiency 10/08/2017  . Patellofemoral arthritis of left knee 08/14/2017  . Acute pain of left knee 08/14/2017  . Screening for colon cancer 09/01/2013  . Counseling on health promotion  and disease prevention 09/01/2013  . URINALYSIS, ABNORMAL 02/08/2010     Current Meds  Medication Sig  . hydrochlorothiazide (HYDRODIURIL) 12.5 MG tablet Take 1 tablet (12.5 mg total) by mouth daily.  . metoprolol succinate (TOPROL-XL) 25 MG 24 hr tablet Take 1 tablet (25 mg total) by mouth daily.  [name] olmesartan (BENICAR) 40 MG tablet Take 1 tablet (40 mg total) by mouth daily.  . [DISCONTINUED] hydrochlorothiazide (HYDRODIURIL) 12.5 MG tablet TAKE 1 TABLET BY MOUTH  DAILY  . [DISCONTINUED] metoprolol succinate (TOPROL-XL) 25 MG 24 hr tablet TAKE 1 TABLET BY MOUTH  DAILY  . [DISCONTINUED] olmesartan (BENICAR) 40 MG tablet Take 1 tablet (40 mg total) by mouth daily. Patient needs office visit for further refills.     Allergies  Allergen Reactions  . Amlodipine Nausea And Vomiting  . Ibuprofen     REACTION: unspecified  . Lisinopril Cough  . Benazepril Rash  . Valsartan-Hydrochlorothiazide Rash     ROS:  See above HPI for pertinent positives and negatives   Objective:   Blood pressure (!) 155/88, pulse 75, height 5' 8"  (1.727 m), weight 230 lb (104.3 kg).  (if some vitals are omitted, this means that patient was UNABLE to obtain them even though they were asked to get them prior to OV today.  They were asked to call us at their earliest convenience with these once obtained.)  General: A & O * 3; visually in no acute distress; in usual state of health.  Skin: Visible skin appears normal and pt's usual skin color HEENT:  EOMI, head is normocephalic and atraumatic.  Sclera are anicteric. Neck has a good range of motion.  Lips are noncyanotic Chest: normal chest excursion and movement Respiratory: speaking in full sentences, no conversational dyspnea; no use of accessory muscles Psych: insight good, mood- appears full

## 2018-08-21 ENCOUNTER — Other Ambulatory Visit: Payer: Self-pay

## 2018-08-21 NOTE — Patient Outreach (Signed)
[facility]) Care Management  08/21/2018  Doris Harris Feb 05, 1946 [id number]   Medication Adherence call to Doris Harris HIPPA Compliant Voice message left with a call back number. Doris Harris is showing past due on Olmesartan 40 mg under [employer].   Lillia Abed CPhT Pharmacy Technician [facility] Care Management Direct Dial [phone]  Fax [phone] [email or web address]

## 2018-12-08 ENCOUNTER — Other Ambulatory Visit: Payer: Medicare Other

## 2018-12-12 ENCOUNTER — Ambulatory Visit: Payer: Medicare Other | Admitting: Family Medicine

## 2019-01-04 ENCOUNTER — Other Ambulatory Visit: Payer: Self-pay | Admitting: Family Medicine

## 2019-01-04 DIAGNOSIS — I10 Essential (primary) hypertension: Secondary | ICD-10-CM

## 2019-01-05 ENCOUNTER — Other Ambulatory Visit: Payer: Medicare Other

## 2019-01-07 ENCOUNTER — Ambulatory Visit: Payer: Medicare Other | Admitting: Family Medicine

## 2019-01-12 ENCOUNTER — Other Ambulatory Visit: Payer: Self-pay

## 2019-01-12 ENCOUNTER — Other Ambulatory Visit: Payer: Medicare Other

## 2019-01-12 DIAGNOSIS — I10 Essential (primary) hypertension: Secondary | ICD-10-CM

## 2019-01-12 DIAGNOSIS — Z79899 Other long term (current) drug therapy: Secondary | ICD-10-CM | POA: Diagnosis not present

## 2019-01-12 DIAGNOSIS — E7800 Pure hypercholesterolemia, unspecified: Secondary | ICD-10-CM | POA: Diagnosis not present

## 2019-01-12 DIAGNOSIS — R7303 Prediabetes: Secondary | ICD-10-CM | POA: Diagnosis not present

## 2019-01-13 LAB — LIPID PANEL
Chol/HDL Ratio: 2.8 ratio (ref 0.0–4.4)
Cholesterol, Total: 212 mg/dL — ABNORMAL HIGH (ref 100–199)
HDL: 77 mg/dL (ref >39)
LDL Chol Calc (NIH): 119 mg/dL — ABNORMAL HIGH (ref 0–99)
Triglycerides: 91 mg/dL (ref 0–149)
VLDL Cholesterol Cal: 16 mg/dL (ref 5–40)

## 2019-01-13 LAB — HEMOGLOBIN A1C
Est. average glucose Bld gHb Est-mCnc: 117 mg/dL
Hgb A1c MFr Bld: 5.7 % — ABNORMAL HIGH (ref 4.8–5.6)

## 2019-01-14 ENCOUNTER — Other Ambulatory Visit: Payer: Self-pay

## 2019-01-14 ENCOUNTER — Encounter: Payer: Self-pay | Admitting: Family Medicine

## 2019-01-14 ENCOUNTER — Ambulatory Visit (INDEPENDENT_AMBULATORY_CARE_PROVIDER_SITE_OTHER): Payer: Medicare Other | Admitting: Family Medicine

## 2019-01-14 VITALS — BP 166/102 | Ht 67.75 in | Wt 232.0 lb

## 2019-01-14 DIAGNOSIS — E66811 Obesity, class 1: Secondary | ICD-10-CM

## 2019-01-14 DIAGNOSIS — R7303 Prediabetes: Secondary | ICD-10-CM | POA: Diagnosis not present

## 2019-01-14 DIAGNOSIS — E7800 Pure hypercholesterolemia, unspecified: Secondary | ICD-10-CM

## 2019-01-14 DIAGNOSIS — E559 Vitamin D deficiency, unspecified: Secondary | ICD-10-CM | POA: Diagnosis not present

## 2019-01-14 DIAGNOSIS — I10 Essential (primary) hypertension: Secondary | ICD-10-CM | POA: Diagnosis not present

## 2019-01-14 DIAGNOSIS — E669 Obesity, unspecified: Secondary | ICD-10-CM | POA: Diagnosis not present

## 2019-01-14 MED ORDER — OLMESARTAN MEDOXOMIL 20 MG PO TABS: 20.0000 mg | ORAL_TABLET | Freq: Every day | ORAL | 0 refills | Status: DC

## 2019-01-14 MED ORDER — VERAPAMIL HCL ER 120 MG PO TBCR: 120.0000 mg | EXTENDED_RELEASE_TABLET | Freq: Every day | ORAL | 0 refills | Status: DC

## 2019-01-14 MED ORDER — METOPROLOL SUCCINATE ER 100 MG PO TB24: 100.0000 mg | ORAL_TABLET | Freq: Every day | ORAL | 0 refills | Status: DC

## 2019-01-14 NOTE — Progress Notes (Signed)
Virtual / live video office visit note for [employer], D.O- Primary Care Physician at [facility]   I connected with current patient today and beyond visually recognizing the correct individual, I verified that I am speaking with the correct person using two identifiers.   Location of the patient: Home  Location of the provider: Office Only the patient (+/- their family members at pt's discretion) and myself were participating in the encounter    - This visit type was conducted due to national recommendations for restrictions regarding the COVID-19 Pandemic (e.g. social distancing) in an effort to limit this patient's exposure and mitigate transmission in our community.  This format is felt to be most appropriate for this patient at this time.   - The patient did have access to video technology today  - No physical exam could be performed with this format, beyond that communicated to [name] by the patient/ family members as noted.   - Additionally my office staff/ schedulers discussed with the patient that there may be a monetary charge related to this service, depending on patient's medical insurance.   The patient expressed understanding, and agreed to proceed.      History of Present Illness:  Notes she's been doing good.  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  States "it is not going good."  Her heart rate at home has been in the 70's, sometimes 80-83.  Denies lows, dizziness, lightheadedness.    Confirms she's been taking the Olmesartan daily and  tolerating it okay. Notes some days she has bad headaches on the Olmesartan and some days she does not.  Notes amlodipine didn't work because it made her stomach ache.  Stopped taking her HCTZ a little over a month ago because "I really don't need it I don't think; I was going to the bathroom too much and not getting any sleep at night."  Says "I was taking it because of years ago when I had the lisinopril."  Says "I don't feel like I really need that because I've been peeing like I should and not retaining."  Says "actually when I was taking the medicine, that's when I was feeling lightheaded sometimes."  - Hyperlipidemia Notes she stopped taking her atorvastatin.  Says "it didn't agree with me."    Notes "it was 3-4 days into it, my joints started aching, hurting real bad, and I knew that's what I had added to my medications so I just stopped taking it."  - Lifestyle Notes she is walking regularly; "I try to do it 4 days a week, and I've been going to [place] and walking."  Notes she walks about an hour each time.  She's been doing this for about two months.     Depression screen [facility] 2/9 08/12/2018 10/08/2017 08/14/2017 02/18/2017 03/13/2016  Decreased Interest 0 0 0 0 0  Down, Depressed, Hopeless 0 0 0 0 0  PHQ - 2 Score 0 0 0 0 0  Altered sleeping  0 0 0 - -  Tired, decreased energy 0 0 0 - -  Change in appetite 0 0 0 - -  Feeling bad or failure about yourself  0 0 0 - -  Trouble concentrating 0 0 0 - -  Moving slowly or fidgety/restless 0 0 0 - -  Suicidal thoughts 0 0 0 - -  PHQ-9 Score 0 0 0 - -  Difficult doing work/chores Not difficult at all Not difficult at all Not difficult at all - -    No flowsheet data found.   Impression and Recommendations:    1. Essential hypertension   2. Elevated LDL cholesterol level   3. Prediabetes   4. Obesity, Class I, BMI 30-34.9   5. Vitamin D insufficiency     - Last seen 08/12/2018 for virtual visit.  - Discussed CRITICAL  IMPORTANCE of compliance with treatment plan.  Advised patient of need to abide by management as established to try to avoid health complications.  - Lab work last obtained 01/12/2019.  Essential Hypertension - BP last OV was 155/88. - BP not controlled at home.  - Per patient, discontinued her HCTZ about a month ago. - Advised patient that HCTZ is used as a blood pressure medicine.  - Dose of olmesartan reduced.  See med list.  - Begin low dose new calcium channel blocker.  See med list. - Will monitor for S-E.  - Increasing dose of metoprolol.  See med list. - Patient knows to watch for S-E of lightheadedness and cut dose in half if she experiences this.  - Reviewed patient's history of S-E with certain classes of medications. - Lengthy conversation held with patient today regarding treatment plan. - Extensive education provided and all questions answered.  - Will continue to monitor closely.   - Counseled patient on pathophysiology of disease and discussed various treatment options, which always includes dietary and lifestyle modification as first line.   - Lifestyle changes such as dash and heart healthy diets and engaging in a regular exercise program discussed extensively with patient.   - Ambulatory blood pressure monitoring encouraged at least 3 times weekly.  Keep log and bring in every office visit.  Reminded patient that if they ever feel poorly in any way, to check their blood pressure and pulse.  Hyperlipidemia - Elevated LDL Cholesterol Level - Pt with long history of declining statin. - Patient finally agreed to begin statin last OV.  - Started Lipitor 4 months ago. - Per pt, discontinued Lipitor after 3-4 days.  Last Lipid Panel: Triglycerides = 91, stable. HDL = 77, down from 87 prior. LDL = 119, elevated, down from 133 prior.  The 10-year ASCVD risk score Denman George(Goff DC Montez HagemanJr., et al., 2013) is: 22.6%   Values used to calculate the score:     Age: 89 years      Sex: Female     Is Non-Hispanic African American: Yes     Diabetic: No     Tobacco smoker: No     Systolic Blood Pressure: 166 mmHg     Is BP treated: Yes     HDL Cholesterol: 77 mg/dL     Total Cholesterol: 212 mg/dL  - Discussed that patient's risk of a cardiac event in near future is very high. - Lengthy conversation held with patient and all questions answered.  - Discussed beginning Crestor instead of Lipitor, which may be less likely to cause aches and pains - To help reduce side-effects, encouraged adequate  hydration and regular physical activity.  - Ongoing prudent dietary changes such as low saturated & trans fat diets for hyperlipidemia and low carb diets for hypertriglyceridemia discussed with patient.    - Encouraged patient to follow AHA guidelines for regular exercise and also engage in weight loss if BMI above 25.   - We will continue to monitor.  BMI Counseling - Obesity, Body mass index is 35.54 kg/m Explained to patient what BMI refers to, and what it means medically.    Told patient to think about it as a "medical risk stratification measurement" and how increasing BMI is associated with increasing risk/ or worsening state of various diseases such as hypertension, hyperlipidemia, diabetes, premature OA, depression etc.  American Heart Association guidelines for healthy diet, basically Mediterranean diet, and exercise guidelines of 30 minutes 5 days per week or more discussed in detail.  Health counseling performed.  All questions answered.  Health Counseling & Preventative Health Maintenance - Advised patient to continue working toward exercising to improve overall mental, physical, and emotional health.    - Reviewed the "spokes of the wheel" of mood and health management.  Stressed the importance of ongoing prudent habits, including regular exercise, appropriate sleep hygiene, healthful dietary habits, and prayer/meditation to relax.  - Encouraged patient to  engage in daily physical activity, especially a formal exercise routine.  Recommended that the patient eventually strive for at least 150 minutes of moderate cardiovascular activity per week according to guidelines established by the [facility]   - Healthy dietary habits encouraged, including low-carb, and high amounts of lean protein in diet.   - Patient should also consume adequate amounts of water.  Recommendations - Return in 4-5 weeks after changing BP medications.  - As part of my medical decision making, I reviewed the following data within the electronic MEDICAL RECORD NUMBER History obtained from pt /family, CMA notes reviewed and incorporated if applicable, Labs reviewed, Radiograph/ tests reviewed if applicable and OV notes from prior OV's with me, as well as other specialists she/he has seen since seeing me last, were all reviewed and used in my medical decision making process today.   - Additionally, discussion had with patient regarding txmnt plan, their biases about that plan etc were used in my medical decision making today.   - The patient agreed with the plan and demonstrated an understanding of the instructions.   No barriers to understanding were identified.   - Red flag symptoms and signs discussed in detail.  Patient expressed understanding regarding what to do in case of emergency\ urgent symptoms.  The patient was advised to call back or seek an in-person evaluation if the symptoms worsen or if the condition fails to improve as anticipated.   Return for 4-5 weeks in person OV after changing BP medications.    Orders Placed This Encounter  Procedures   Comprehensive metabolic panel    Meds ordered this encounter  Medications   metoprolol succinate (TOPROL-XL) 100 MG 24 hr tablet    Sig: Take 1 tablet (100 mg total) by mouth daily.    Dispense:  90 tablet    Refill:  0   verapamil (CALAN-SR) 120 MG CR tablet    Sig: Take 1 tablet (120 mg total) by mouth at bedtime.     Dispense:  90 tablet    Refill:  0   olmesartan (BENICAR) 20 MG tablet    Sig: Take 1 tablet (20 mg total) by mouth daily.    Dispense:  90 tablet    Refill:  0    Medications Discontinued During This Encounter  Medication Reason   hydrochlorothiazide (HYDRODIURIL) 12.5 MG tablet Patient Preference   atorvastatin (LIPITOR) 10 MG tablet Patient Preference   olmesartan (BENICAR) 40 MG tablet Change in therapy   metoprolol succinate (TOPROL-XL) 25 MG 24 hr tablet Reorder     I provided 30+ minutes of non face-to-face time during this encounter.  Additional time was spent with charting and coordination of care after the actual visit commenced.    Note:  This note was prepared with assistance of Dragon voice recognition software. Occasional wrong-word or sound-a-like substitutions may have occurred due to the inherent limitations of voice recognition software.   This document serves as a record of services personally performed by Thomasene Lot, DO. It was created on her behalf by Peggye Fothergill, a trained medical scribe. The creation of this record is based on the scribe's personal observations and the provider's statements to them.   I have reviewed the above medical documentation for accuracy and completeness and I concur.  Thomasene Lot, DO 01/17/2019 3:00 PM        Patient Care Team    Relationship Specialty Notifications Start End  Thomasene Lot, DO PCP - General Family Medicine  01/26/16     -Vitals obtained; medications/ allergies reconciled;  personal medical, social, Sx etc.histories were updated by CMA, reviewed by me and are reflected in chart  Patient Active Problem List   Diagnosis Date Noted   Obesity, Class I, BMI 30-34.9 01/25/2016    Priority: High   Prediabetes 01/25/2016    Priority: High   Elevated LDL cholesterol level 01/25/2016    Priority: High   Essential hypertension 12/02/2014    Priority: High   Elevated HDL 10/08/2017     Vitamin D insufficiency 10/08/2017   Patellofemoral arthritis of left knee 08/14/2017   Acute pain of left knee 08/14/2017   Screening for colon cancer 09/01/2013   Counseling on health promotion and disease prevention 09/01/2013   URINALYSIS, ABNORMAL 02/08/2010     Current Meds  Medication Sig   metoprolol succinate (TOPROL-XL) 100 MG 24 hr tablet Take 1 tablet (100 mg total) by mouth daily.   olmesartan (BENICAR) 20 MG tablet Take 1 tablet (20 mg total) by mouth daily.   [DISCONTINUED] metoprolol succinate (TOPROL-XL) 25 MG 24 hr tablet Take 1 tablet (25 mg total) by mouth daily.   [DISCONTINUED] olmesartan (BENICAR) 40 MG tablet Take 1 tablet (40 mg total) by mouth daily.     Allergies  Allergen Reactions   Amlodipine Nausea And Vomiting   Ibuprofen     REACTION: unspecified   Lipitor [Atorvastatin Calcium] Other (See Comments)    Joint pain   Lisinopril Cough   Benazepril Rash   Valsartan-Hydrochlorothiazide Rash     ROS:  See above HPI for pertinent positives and negatives   Objective:   Blood pressure (!) 166/102, height 5' 7.75" (1.721 m), weight 232 lb (105.2 kg).  (if some vitals are omitted, this means that patient was UNABLE to obtain them even though they were asked to get them prior to OV today.  They were asked to call us at their earliest convenience with these once obtained.)  General: A & O * 3; visually in no acute distress; in usual state of health.  Skin: Visible skin appears normal and pt's usual skin color HEENT:  EOMI, head is normocephalic and atraumatic.  Sclera are anicteric. Neck has a  good range of motion.  Lips are noncyanotic Chest: normal chest excursion and movement Respiratory: speaking in full sentences, no conversational dyspnea; no use of accessory muscles Psych: insight good, mood- appears full

## 2019-02-23 ENCOUNTER — Encounter: Payer: Self-pay | Admitting: Family Medicine

## 2019-02-23 ENCOUNTER — Ambulatory Visit (INDEPENDENT_AMBULATORY_CARE_PROVIDER_SITE_OTHER): Payer: Medicare Other | Admitting: Family Medicine

## 2019-02-23 ENCOUNTER — Other Ambulatory Visit: Payer: Self-pay

## 2019-02-23 VITALS — BP 190/97 | HR 71 | Temp 98.1°F | Resp 12 | Ht 67.5 in | Wt 238.9 lb

## 2019-02-23 DIAGNOSIS — R7303 Prediabetes: Secondary | ICD-10-CM | POA: Diagnosis not present

## 2019-02-23 DIAGNOSIS — E669 Obesity, unspecified: Secondary | ICD-10-CM

## 2019-02-23 DIAGNOSIS — E559 Vitamin D deficiency, unspecified: Secondary | ICD-10-CM

## 2019-02-23 DIAGNOSIS — F4329 Adjustment disorder with other symptoms: Secondary | ICD-10-CM | POA: Insufficient documentation

## 2019-02-23 DIAGNOSIS — E7889 Other lipoprotein metabolism disorders: Secondary | ICD-10-CM | POA: Diagnosis not present

## 2019-02-23 DIAGNOSIS — I10 Essential (primary) hypertension: Secondary | ICD-10-CM

## 2019-02-23 DIAGNOSIS — E7800 Pure hypercholesterolemia, unspecified: Secondary | ICD-10-CM

## 2019-02-23 MED ORDER — METOPROLOL SUCCINATE ER 100 MG PO TB24: 100.0000 mg | ORAL_TABLET | Freq: Every day | ORAL | 0 refills | Status: DC

## 2019-02-23 MED ORDER — OLMESARTAN MEDOXOMIL 40 MG PO TABS: 40.0000 mg | ORAL_TABLET | Freq: Every day | ORAL | 0 refills | Status: DC

## 2019-02-23 MED ORDER — VERAPAMIL HCL ER 120 MG PO TBCR: 120.0000 mg | EXTENDED_RELEASE_TABLET | Freq: Every day | ORAL | 0 refills | Status: DC

## 2019-02-23 NOTE — Progress Notes (Signed)
Impression and Recommendations:    1. Essential hypertension   2. Morbid obesity (HCC)   3. Prediabetes   4. Elevated LDL cholesterol level   5. Elevated HDL   6. Stress and adjustment reaction   7. Obesity, Class I, BMI 30-34.9   8. Vitamin D insufficiency      Essential Hypertension - BP remains elevated. - Reviewed goal BP of 130's/80's. - Discussed that patient's blood pressure has been consistently elevated for many months.  - Reviewed possible causes of elevated blood pressure, such as weight gain and increased acute stress. - Extensive education provided today.  All questions answered.  - Last visit, decreased dose of olmesartan, increased metoprolol, and added verapamil. - Increase dose of olmesartan to 40.   - Otherwise continue treatment as prescribed.  See med list.  - In 4 weeks, patient will call with BP log to update us regarding BP and pulse. - If BP remains uncontrolled, will consider addition of  hydralazine or clonidine I nfuture.  - Discussed possible need for referral to hypertension clinic PRN if patient continues to have S-E / difficulty tolerating blood pressure medications.  - Counseled patient on pathophysiology of disease and discussed various treatment options, which always includes dietary and lifestyle modification as first line.   - Lifestyle changes such as dash and heart healthy diets and engaging in a regular exercise program discussed extensively with patient.   - Ambulatory blood pressure monitoring encouraged at least 3 times weekly.  Keep log and bring in every office visit.  Reminded patient that if they ever feel poorly in any way, to check their blood pressure and pulse.  - Handouts provided at patient's desire and/or told to go online at the American Heart Association website for further information  - We will continue to monitor  Stress and Adjustment Reaction - Patient declines prescription intervention today.  - Reviewed  the "spokes of the wheel" of mood and health management.  Stressed the importance of ongoing prudent habits, including regular exercise, appropriate sleep hygiene, healthful dietary habits, and prayer/meditation to relax.  - Education patient regarding importance of reducing stress to help improve overall wellbeing, including improved blood pressure.  - Will continue to monitor.  Patient knows to call for assistance PRN.  BMI Counseling - Body mass index is 36.86 kg/m; Morbid Obesity Explained to patient what BMI refers to, and what it means medically.    Told patient to think about it as a "medical risk stratification measurement" and how increasing BMI is associated with increasing risk/ or worsening state of various diseases such as hypertension, hyperlipidemia, diabetes, premature OA, depression etc.  American Heart Association guidelines for healthy diet, basically Mediterranean diet, and exercise guidelines of 30 minutes 5 days per week or more discussed in detail.  Health counseling performed.  All questions answered.  - Discussed referral to nutritionist or [facility] management [facility].   - Ambulatory referral placed to [facility] today.  Reviewed that this referral may take three months to go through, and encouraged patient to call for follow-up.  - Given weight gain in addition to known prediabetes and need for cholesterol management, ambulatory referral to nutritionist provided today.  Patient prefers to go for evaluation in person.  - Encouraged use of online resources as recommended.  - Will continue to monitor.  Lifestyle & Preventative Health Maintenance - Advised patient to continue working toward exercising to improve overall mental, physical, and emotional health.    -  Encouraged patient to engage in daily physical activity, especially a formal exercise routine.  Recommended that the patient eventually strive for at least 150 minutes of moderate  cardiovascular activity per week according to guidelines established by the [facility].   - Healthy dietary habits encouraged, including low-carb, and high amounts of lean protein in diet.   - Patient should also consume adequate amounts of water.  Follow-Up - Kidneys and electrolytes (CMP) checked today after starting calcium channel blocker. - Need for full fasting blood work near future.    Orders Placed This Encounter  Procedures  . Amb Ref to Medical Weight Management  . Amb ref to Medical Nutrition Therapy-MNT    Meds ordered this encounter  Medications  . olmesartan (BENICAR) 40 MG tablet    Sig: Take 1 tablet (40 mg total) by mouth daily.    Dispense:  90 tablet    Refill:  0  . metoprolol succinate (TOPROL-XL) 100 MG 24 hr tablet    Sig: Take 1 tablet (100 mg total) by mouth daily.    Dispense:  90 tablet    Refill:  0  . verapamil (CALAN-SR) 120 MG CR tablet    Sig: Take 1 tablet (120 mg total) by mouth at bedtime.    Dispense:  90 tablet    Refill:  0    Medications Discontinued During This Encounter  Medication Reason  . olmesartan (BENICAR) 20 MG tablet Reorder  . metoprolol succinate (TOPROL-XL) 100 MG 24 hr tablet Reorder  . verapamil (CALAN-SR) 120 MG CR tablet Reorder     Gross side effects, risk and benefits, and alternatives of medications and treatment plan in general discussed with patient.  Patient is aware that all medications have potential side effects and we are unable to predict every side effect or drug-drug interaction that may occur.   Patient will call with any questions prior to using medication if they have concerns.    Expresses verbal understanding and consents to current therapy and treatment regimen.  No barriers to understanding were identified.  Red flag symptoms and signs discussed in detail.  Patient expressed understanding regarding what to do in case of emergency\urgent symptoms  Please see AVS handed out to patient at the end of our  visit for further patient instructions/ counseling done pertaining to today's office visit.   Patient was interviewed and evaluated by me/ [facility] staff members in the clinic today for 40+ minutes, with over 50% of my time spent in face to face counseling of patients various medical conditions, treatment plans of those medical conditions including medicine management and lifestyle modification, strategies to improve health and well being; and in coordination of care.   SEE TREATMENT PLAN FOR DETAILS   Return in 4 months (on 06/23/2019) for f/up OV with full fasting blood work three days prior to 3mo f/up .     Note:  This note was prepared with assistance of Dragon voice recognition software. Occasional wrong-word or sound-a-like substitutions may have occurred due to the inherent limitations of voice recognition software.   This document serves as a record of services personally performed by Mellody Dance, DO. It was created on her behalf by Toni Amend, a trained medical scribe. The creation of this record is based on the scribe's personal observations and the provider's statements to them.   This case required medical decision making of at least moderate complexity. The above documentation has been reviewed to be accurate and was completed by Marjory Sneddon,  D.O.      --------------------------------------------------------------------------------------------------------------------------------------------------------------------------------------------------------------------------------------------    Subjective:    I, Doris Harris, am serving as scribe for Dr. Thomasene Lot.  HPI: Doris Harris is a 73 y.o. female who presents to [facility] at [facility] today for issues as discussed below.  - Weight Gain & Exercise Notes walking 4 days per week, and sometimes 5-6 days per week; "most times I walk Monday through Saturday, and sometimes on  Sunday."  Notes "I don't eat a lot of meat, I eat a lot of vegetables and fruit, and I don't do a lot of fried foods."  Notes tried Weight Watchers in the past but could not afford it.  - Acute Stress Notes family concerns.  She is involved in caring for her family; has four grandchildren altogether.  Says "I try not to worry about it, but I do."  Does not think she needs assistance for her mood/stress.  - Sleeping "I sleep good; I sleep well."  Notes "when I was taking that HCTZ, I was getting up all through the night about 4-5 times, but after I stopped taking that, I get up maybe twice at night."  "I rest good at night."  HPI:  Hypertension:  -  Her blood pressure at home has been running: Says "bottom stays in the nineties," and "the highest it was was 165/95."  The lowest was 143/94.  She has been checking for three weeks.  - Patient reports good compliance with medication and/or lifestyle modification.  - Her denies acute concerns or problems related to treatment plan.  She wonders today if verapamil can cause constipation.  Notes "I can tell it's not like I was before; I never had problems."  Says she has been drinking water.  Regarding dizziness, says "kind of [experriencing it], not all the time; it's just not really dizziness, but it's like 'for a second.'"  This is "kinda" new for her.  Thinks she's been experiencing it for 2-3 weeks.  Confirms she's been experiencing more stress than usual.  - She denies new onset of: chest pain, exercise intolerance, shortness of breath, visual changes, headache, lower extremity swelling or claudication.   Last 3 blood pressure readings in our office are as follows: BP Readings from Last 3 Encounters:  02/23/19 (!) 190/97  01/14/19 (!) 166/102  08/12/18 (!) 155/88   Filed Weights   02/23/19 0955  Weight: 238 lb 14.4 oz (108.4 kg)         Wt Readings from Last 3 Encounters:  02/23/19 238 lb 14.4 oz (108.4 kg)  01/14/19 232 lb  (105.2 kg)  08/12/18 230 lb (104.3 kg)   BP Readings from Last 3 Encounters:  02/23/19 (!) 190/97  01/14/19 (!) 166/102  08/12/18 (!) 155/88   Pulse Readings from Last 3 Encounters:  02/23/19 71  08/12/18 75  10/08/17 68   BMI Readings from Last 3 Encounters:  02/23/19 36.86 kg/m  01/14/19 35.54 kg/m  08/12/18 34.97 kg/m     Patient Care Team    Relationship Specialty Notifications Start End  Thomasene Lot, DO PCP - General Family Medicine  01/26/16      Patient Active Problem List   Diagnosis Date Noted  . Obesity, Class I, BMI 30-34.9 01/25/2016    Priority: High  . Prediabetes 01/25/2016    Priority: High  . Elevated LDL cholesterol level 01/25/2016    Priority: High  . Essential hypertension 12/02/2014    Priority: High  . Morbid  obesity (HCC) 02/23/2019  . Stress and adjustment reaction 02/23/2019  . Elevated HDL 10/08/2017  . Vitamin D insufficiency 10/08/2017  . Patellofemoral arthritis of left knee 08/14/2017  . Acute pain of left knee 08/14/2017  . Screening for colon cancer 09/01/2013  . Counseling on health promotion and disease prevention 09/01/2013  . URINALYSIS, ABNORMAL 02/08/2010    Past Medical history, Surgical history, Family history, Social history, Allergies and Medications have been entered into the medical record, reviewed and changed as needed.    Current Meds  Medication Sig  . metoprolol succinate (TOPROL-XL) 100 MG 24 hr tablet Take 1 tablet (100 mg total) by mouth daily.  [name] olmesartan (BENICAR) 40 MG tablet Take 1 tablet (40 mg total) by mouth daily.  . verapamil (CALAN-SR) 120 MG CR tablet Take 1 tablet (120 mg total) by mouth at bedtime.  . [DISCONTINUED] metoprolol succinate (TOPROL-XL) 100 MG 24 hr tablet Take 1 tablet (100 mg total) by mouth daily.  . [DISCONTINUED] olmesartan (BENICAR) 20 MG tablet Take 1 tablet (20 mg total) by mouth daily.  . [DISCONTINUED] verapamil (CALAN-SR) 120 MG CR tablet Take 1 tablet (120 mg  total) by mouth at bedtime.    Allergies:  Allergies  Allergen Reactions  . Amlodipine Nausea And Vomiting  . Hctz [Hydrochlorothiazide] Other (See Comments)    Freq urination- 10+ times/ day  . Ibuprofen     REACTION: unspecified  . Lipitor [Atorvastatin Calcium] Other (See Comments)    Joint pain  . Lisinopril Cough  . Benazepril Rash  . Valsartan-Hydrochlorothiazide Rash     Review of Systems:  A fourteen system review of systems was performed and found to be positive as per HPI.   Objective:   Blood pressure (!) 190/97, pulse 71, temperature 98.1 F (36.7 C), temperature source Oral, resp. rate 12, height 5' 7.5" (1.715 m), weight 238 lb 14.4 oz (108.4 kg), SpO2 94 %. Body mass index is 36.86 kg/m. General:  Well Developed, well nourished, appropriate for stated age.  Neuro:  Alert and oriented,  extra-ocular muscles intact  HEENT:  Normocephalic, atraumatic, neck supple, no carotid bruits appreciated  Skin:  no gross rash, warm, pink. Cardiac:  RRR, S1 S2 Respiratory:  ECTA B/L and A/P, Not using accessory muscles, speaking in full sentences- unlabored. Vascular:  Ext warm, no cyanosis apprec.; cap RF less 2 sec. Psych:  No HI/SI, judgement and insight good, Euthymic mood. Full Affect.

## 2019-02-23 NOTE — Patient Instructions (Addendum)
- In 4 weeks, call with blood pressure (BP) log.   - Let us know your blood pressures and pulses at home. - If BP remains uncontrolled, will consider addition of further BP meds as advised.       Your goal blood pressure should be 140/90 or less on a regular basis, or medications should be started/ modified.    Normal blood pressure is less than 120/80.    Hypertension Hypertension, commonly called high blood pressure, is when the force of blood pumping through the arteries is too strong. The arteries are the blood vessels that carry blood from the heart throughout the body. Hypertension forces the heart to work harder to pump blood and may cause arteries to become narrow or stiff. Having untreated or uncontrolled hypertension can cause heart attacks, strokes, kidney disease, and other problems. A blood pressure reading consists of a higher number over a lower number. Ideally, your blood pressure should be below 120/80. The first ("top") number is called the systolic pressure. It is a measure of the pressure in your arteries as your heart beats. The second ("bottom") number is called the diastolic pressure. It is a measure of the pressure in your arteries as the heart relaxes. What are the causes? The cause of this condition is not known. What increases the risk? Some risk factors for high blood pressure are under your control. Others are not. Factors you can change  Smoking.  Having type 2 diabetes mellitus, high cholesterol, or both.  Not getting enough exercise or physical activity.  Being overweight.  Having too much fat, sugar, calories, or salt (sodium) in your diet.  Drinking too much alcohol. Factors that are difficult or impossible to change  Having chronic kidney disease.  Having a family history of high blood pressure.  Age. Risk increases with age.  Race. You may be at higher risk if you are African-American.  Gender. Men are at higher risk than women before age  72. After age 665, women are at higher risk than men.  Having obstructive sleep apnea.  Stress. What are the signs or symptoms? Extremely high blood pressure (hypertensive crisis) may cause:  Headache.  Anxiety.  Shortness of breath.  Nosebleed.  Nausea and vomiting.  Severe chest pain.  Jerky movements you cannot control (seizures).  How is this diagnosed? This condition is diagnosed by measuring your blood pressure while you are seated, with your arm resting on a surface. The cuff of the blood pressure monitor will be placed directly against the skin of your upper arm at the level of your heart. It should be measured at least twice using the same arm. Certain conditions can cause a difference in blood pressure between your right and left arms. Certain factors can cause blood pressure readings to be lower or higher than normal (elevated) for a short period of time:  When your blood pressure is higher when you are in a health care provider's office than when you are at home, this is called white coat hypertension. Most people with this condition do not need medicines.  When your blood pressure is higher at home than when you are in a health care provider's office, this is called masked hypertension. Most people with this condition may need medicines to control blood pressure.  If you have a high blood pressure reading during one visit or you have normal blood pressure with other risk factors:  You may be asked to return on a different day to have your  blood pressure checked again.  You may be asked to monitor your blood pressure at home for 1 week or longer.  If you are diagnosed with hypertension, you may have other blood or imaging tests to help your health care provider understand your overall risk for other conditions. How is this treated? This condition is treated by making healthy lifestyle changes, such as eating healthy foods, exercising more, and reducing your alcohol  intake. Your health care provider may prescribe medicine if lifestyle changes are not enough to get your blood pressure under control, and if:  Your systolic blood pressure is above 130.  Your diastolic blood pressure is above 80.  Your personal target blood pressure may vary depending on your medical conditions, your age, and other factors. Follow these instructions at home: Eating and drinking  Eat a diet that is high in fiber and potassium, and low in sodium, added sugar, and fat. An example eating plan is called the DASH (Dietary Approaches to Stop Hypertension) diet. To eat this way: ? Eat plenty of fresh fruits and vegetables. Try to fill half of your plate at each meal with fruits and vegetables. ? Eat whole grains, such as whole wheat pasta, brown rice, or whole grain bread. Fill about one quarter of your plate with whole grains. ? Eat or drink low-fat dairy products, such as skim milk or low-fat yogurt. ? Avoid fatty cuts of meat, processed or cured meats, and poultry with skin. Fill about one quarter of your plate with lean proteins, such as fish, chicken without skin, beans, eggs, and tofu. ? Avoid premade and processed foods. These tend to be higher in sodium, added sugar, and fat.  Reduce your daily sodium intake. Most people with hypertension should eat less than 1,500 mg of sodium a day.  Limit alcohol intake to no more than 1 drink a day for nonpregnant women and 2 drinks a day for men. One drink equals 12 oz of beer, 5 oz of wine, or 1 oz of hard liquor. Lifestyle  Work with your health care provider to maintain a healthy body weight or to lose weight. Ask what an ideal weight is for you.  Get at least 30 minutes of exercise that causes your heart to beat faster (aerobic exercise) most days of the week. Activities may include walking, swimming, or biking.  Include exercise to strengthen your muscles (resistance exercise), such as pilates or lifting weights, as part of your  weekly exercise routine. Try to do these types of exercises for 30 minutes at least 3 days a week.  Do not use any products that contain nicotine or tobacco, such as cigarettes and e-cigarettes. If you need help quitting, ask your health care provider.  Monitor your blood pressure at home as told by your health care provider.  Keep all follow-up visits as told by your health care provider. This is important. Medicines  Take over-the-counter and prescription medicines only as told by your health care provider. Follow directions carefully. Blood pressure medicines must be taken as prescribed.  Do not skip doses of blood pressure medicine. Doing this puts you at risk for problems and can make the medicine less effective.  Ask your health care provider about side effects or reactions to medicines that you should watch for. Contact a health care provider if:  You think you are having a reaction to a medicine you are taking.  You have headaches that keep coming back (recurring).  You feel dizzy.  You have  swelling in your ankles.  You have trouble with your vision. Get help right away if:  You develop a severe headache or confusion.  You have unusual weakness or numbness.  You feel faint.  You have severe pain in your chest or abdomen.  You vomit repeatedly.  You have trouble breathing. Summary  Hypertension is when the force of blood pumping through your arteries is too strong. If this condition is not controlled, it may put you at risk for serious complications.  Your personal target blood pressure may vary depending on your medical conditions, your age, and other factors. For most people, a normal blood pressure is less than 120/80.  Hypertension is treated with lifestyle changes, medicines, or a combination of both. Lifestyle changes include weight loss, eating a healthy, low-sodium diet, exercising more, and limiting alcohol. This information is not intended to replace  advice given to you by your health care provider. Make sure you discuss any questions you have with your health care provider. Document Released: 03/26/2005 Document Revised: 02/22/2016 Document Reviewed: 02/22/2016 Elsevier Interactive Patient Education  2018 [employer].    How to Take Your Blood Pressure   Blood pressure is a measurement of how strongly your blood is pressing against the walls of your arteries. Arteries are blood vessels that carry blood from your heart throughout your body. Your health care provider takes your blood pressure at each office visit. You can also take your own blood pressure at home with a blood pressure machine. You may need to take your own blood pressure:  To confirm a diagnosis of high blood pressure (hypertension).  To monitor your blood pressure over time.  To make sure your blood pressure medicine is working.  Supplies needed: To take your blood pressure, you will need a blood pressure machine. You can buy a blood pressure machine, or blood pressure monitor, at most drugstores or online. There are several types of home blood pressure monitors. When choosing one, consider the following:  Choose a monitor that has an arm cuff.  Choose a monitor that wraps snugly around your upper arm. You should be able to fit only one finger between your arm and the cuff.  Do not choose a monitor that measures your blood pressure from your wrist or finger.  Your health care provider can suggest a reliable monitor that will meet your needs. How to prepare To get the most accurate reading, avoid the following for 30 minutes before you check your blood pressure:  Drinking caffeine.  Drinking alcohol.  Eating.  Smoking.  Exercising.  Five minutes before you check your blood pressure:  Empty your bladder.  Sit quietly without talking in a dining chair, rather than in a soft couch or armchair.  How to take your blood pressure To check your blood  pressure, follow the instructions in the manual that came with your blood pressure monitor. If you have a digital blood pressure monitor, the instructions may be as follows: 1. Sit up straight. 2. Place your feet on the floor. Do not cross your ankles or legs. 3. Rest your left arm at the level of your heart on a table or desk or on the arm of a chair. 4. Pull up your shirt sleeve. 5. Wrap the blood pressure cuff around the upper part of your left arm, 1 inch (2.5 cm) above your elbow. It is best to wrap the cuff around bare skin. 6. Fit the cuff snugly around your arm. You should be able  to place only one finger between the cuff and your arm. 7. Position the cord inside the groove of your elbow. 8. Press the power button. 9. Sit quietly while the cuff inflates and deflates. 10. Read the digital reading on the monitor screen and write it down (record it). 11. Wait 2-3 minutes, then repeat the steps, starting at step 1.  What does my blood pressure reading mean? A blood pressure reading consists of a higher number over a lower number. Ideally, your blood pressure should be below 120/80. The first ("top") number is called the systolic pressure. It is a measure of the pressure in your arteries as your heart beats. The second ("bottom") number is called the diastolic pressure. It is a measure of the pressure in your arteries as the heart relaxes. Blood pressure is classified into four stages. The following are the stages for adults who do not have a short-term serious illness or a chronic condition. Systolic pressure and diastolic pressure are measured in a unit called mm Hg. Normal  Systolic pressure: below 025.  Diastolic pressure: below 80. Elevated  Systolic pressure: 427-062.  Diastolic pressure: below 80. Hypertension stage 1  Systolic pressure: 376-283.  Diastolic pressure: 15-17. Hypertension stage 2  Systolic pressure: 616 or above.  Diastolic pressure: 90 or above. You can  have prehypertension or hypertension even if only the systolic or only the diastolic number in your reading is higher than normal. Follow these instructions at home:  Check your blood pressure as often as recommended by your health care provider.  Take your monitor to the next appointment with your health care provider to make sure: ? That you are using it correctly. ? That it provides accurate readings.  Be sure you understand what your goal blood pressure numbers are.  Tell your health care provider if you are having any side effects from blood pressure medicine. Contact a health care provider if:  Your blood pressure is consistently high. Get help right away if:  Your systolic blood pressure is higher than 180.  Your diastolic blood pressure is higher than 110. This information is not intended to replace advice given to you by your health care provider. Make sure you discuss any questions you have with your health care provider. Document Released: 09/02/2015 Document Revised: 11/15/2015 Document Reviewed: 09/02/2015 Elsevier Interactive Patient Education  [employer].

## 2019-02-24 LAB — COMPREHENSIVE METABOLIC PANEL
ALT: 25 IU/L (ref 0–32)
AST: 23 IU/L (ref 0–40)
Albumin/Globulin Ratio: 1.4 (ref 1.2–2.2)
Albumin: 4.3 g/dL (ref 3.7–4.7)
Alkaline Phosphatase: 113 IU/L (ref 39–117)
BUN/Creatinine Ratio: 13 (ref 12–28)
BUN: 13 mg/dL (ref 8–27)
Bilirubin Total: 0.5 mg/dL (ref 0.0–1.2)
CO2: 27 mmol/L (ref 20–29)
Calcium: 9.5 mg/dL (ref 8.7–10.3)
Chloride: 103 mmol/L (ref 96–106)
Creatinine, Ser: 0.99 mg/dL (ref 0.57–1.00)
GFR calc Af Amer: 66 mL/min/{1.73_m2} (ref >59)
GFR calc non Af Amer: 57 mL/min/{1.73_m2} — ABNORMAL LOW (ref >59)
Globulin, Total: 3.1 g/dL (ref 1.5–4.5)
Glucose: 108 mg/dL — ABNORMAL HIGH (ref 65–99)
Potassium: 4.6 mmol/L (ref 3.5–5.2)
Sodium: 140 mmol/L (ref 134–144)
Total Protein: 7.4 g/dL (ref 6.0–8.5)

## 2019-03-12 IMAGING — DX DG KNEE COMPLETE 4+V*L*
4 series · 4 of 4 positions shown · non-contrast
Comparison: None.

CLINICAL DATA: 71-year-old female with left knee pain and swelling
for 2 weeks. Pain maximal behind the knee.

EXAM:
LEFT KNEE - COMPLETE 4+ VIEW

[knee ap]
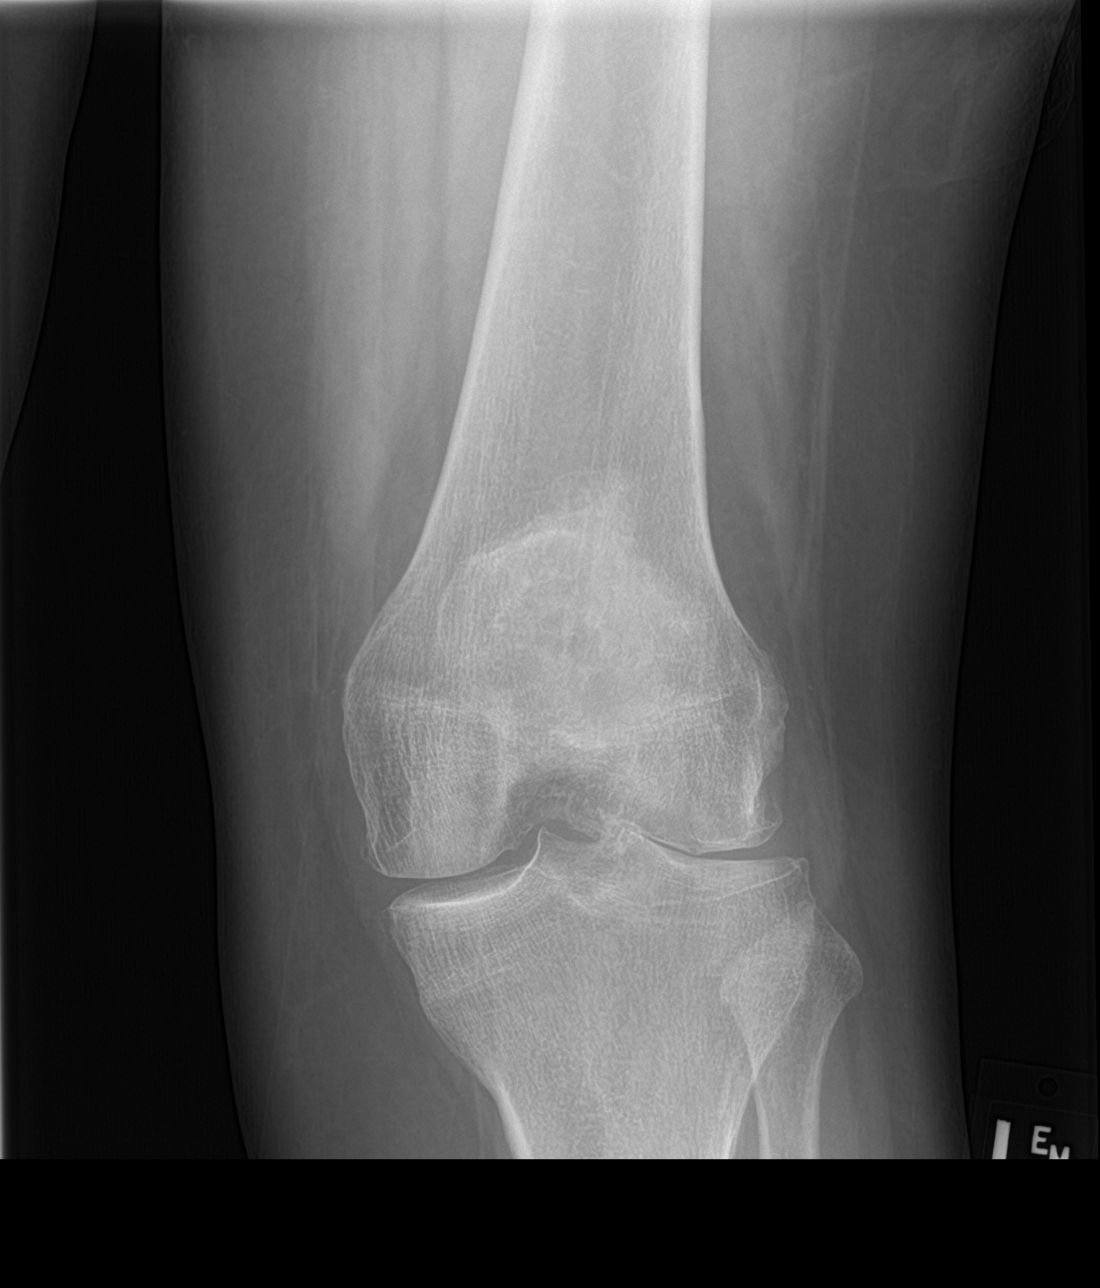

[knee lat]
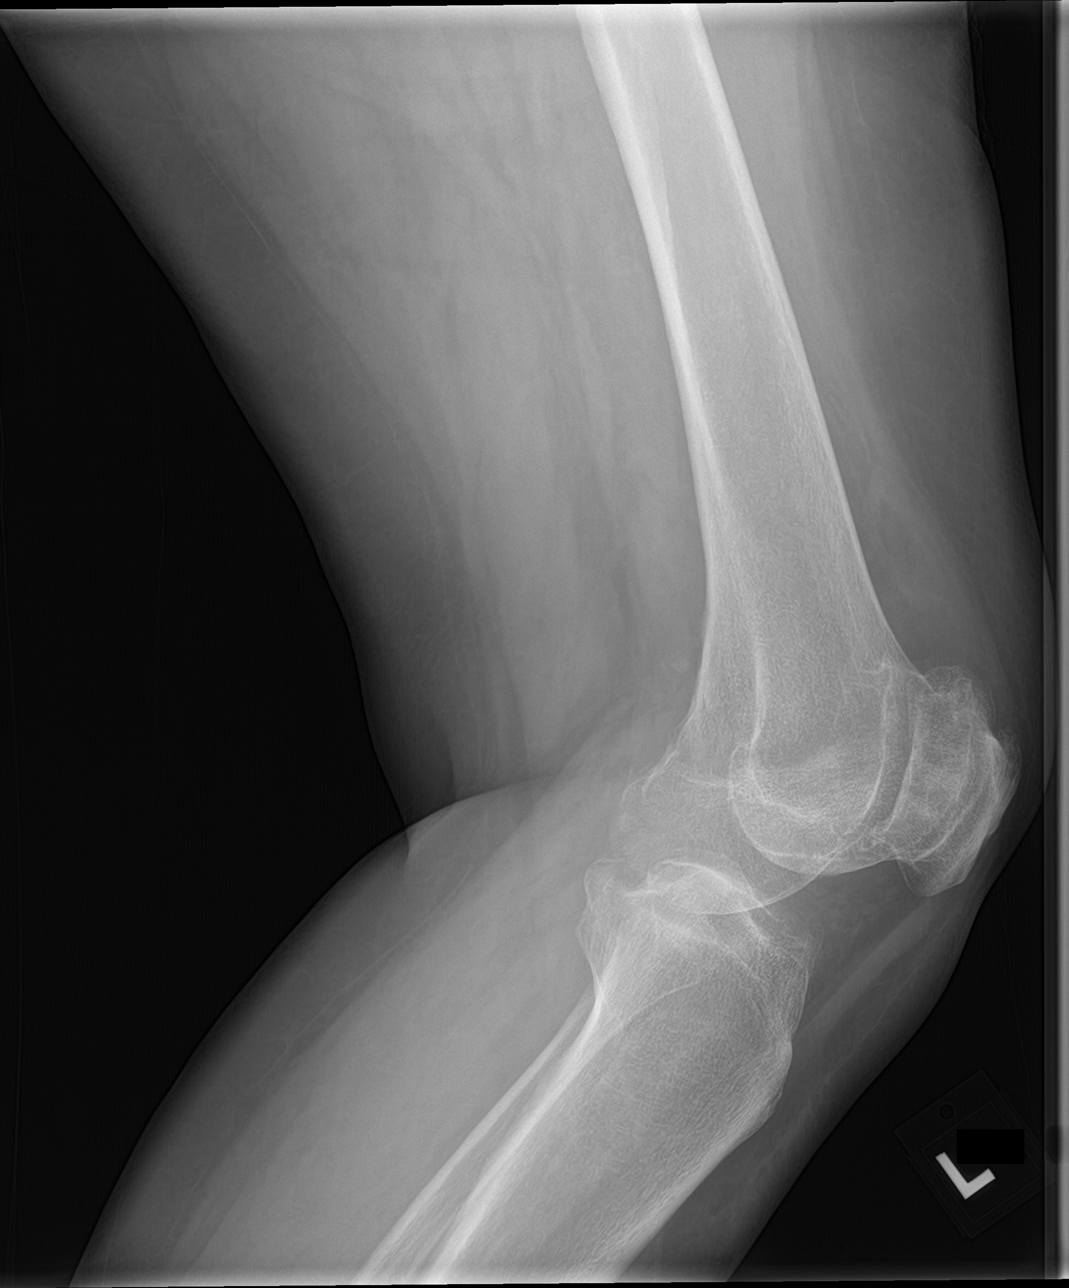

[knee obl (1 of 2)]
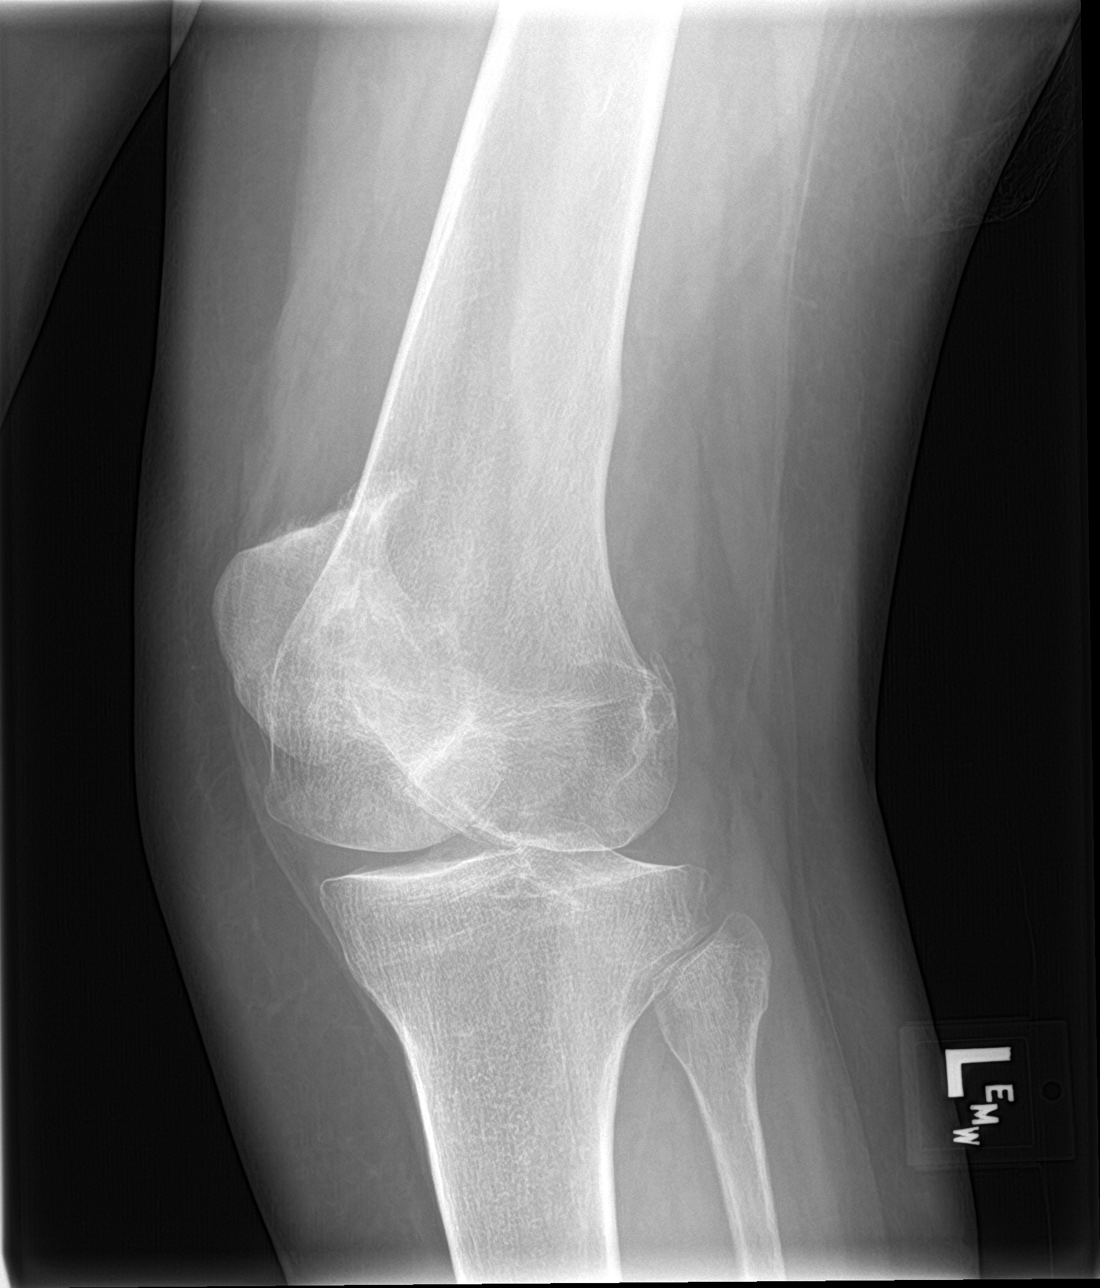

[knee obl (2 of 2)]
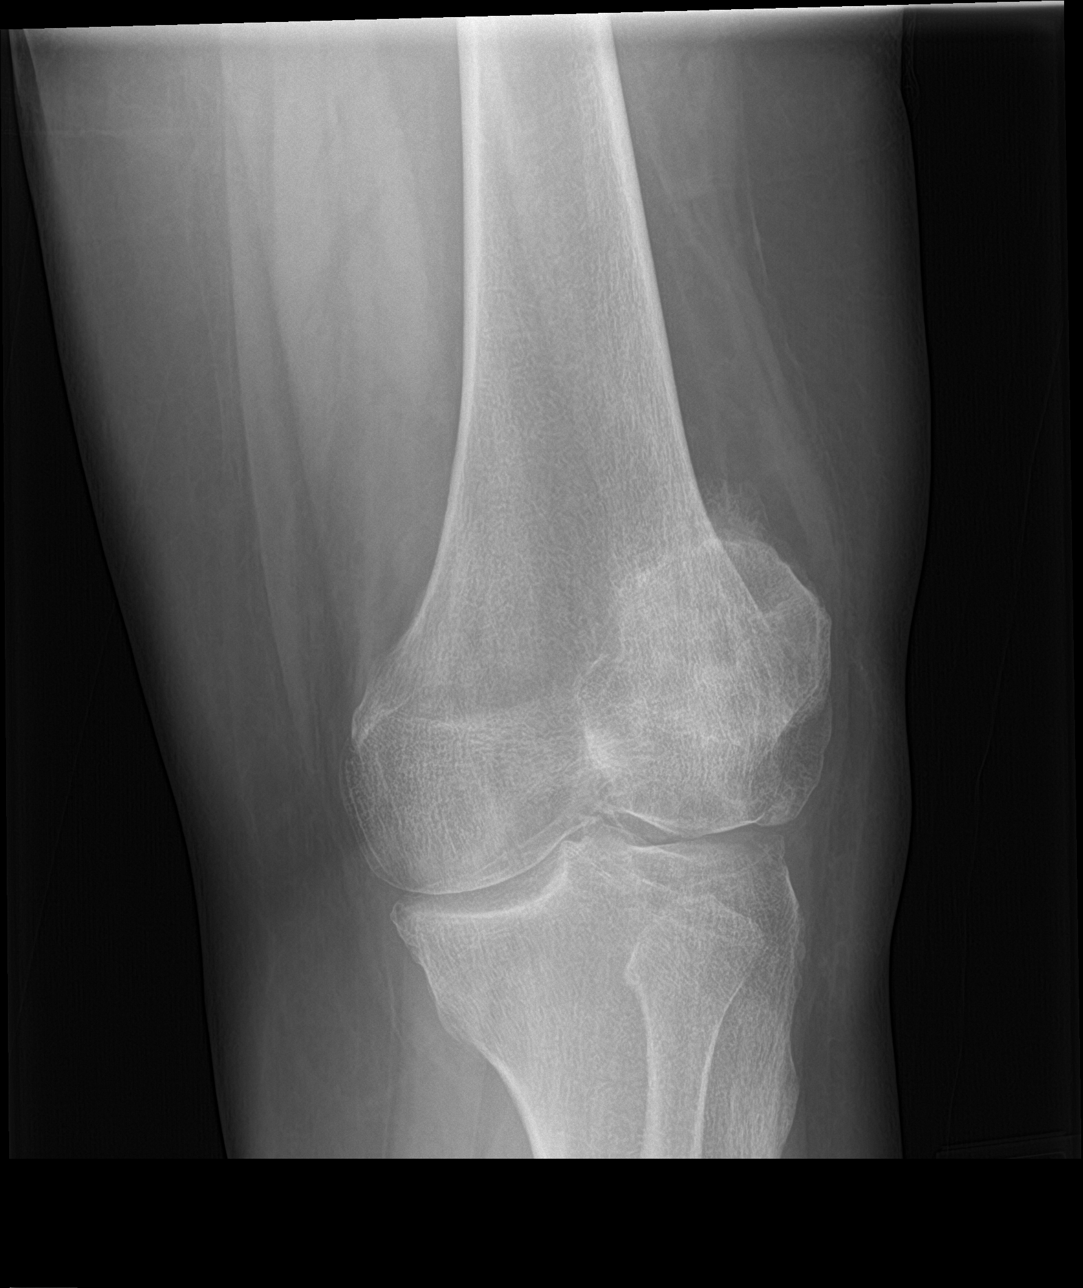

[4 of 4 positions shown; findings below may reference images not displayed]

FINDINGS: Moderate to severe lateral compartment and patellofemoral
compartment degenerative spurring with similar loss of joint space
suspected. No joint effusion. Better preserved medial compartment
with only mild for age degenerative spurring. No acute osseous
abnormality identified.
IMPRESSION: No acute osseous abnormality identified. Moderate to severe lateral
and patellofemoral compartment degenerative changes.

## 2019-03-13 ENCOUNTER — Other Ambulatory Visit: Payer: Self-pay | Admitting: Internal Medicine

## 2019-03-13 DIAGNOSIS — Z1231 Encounter for screening mammogram for malignant neoplasm of breast: Secondary | ICD-10-CM

## 2019-03-17 ENCOUNTER — Other Ambulatory Visit: Payer: Self-pay

## 2019-03-17 NOTE — Patient Outreach (Signed)
[facility]) Care Management  03/17/2019  KHYLEIGH FURNEY 12/12/1945 [id number]   Medication Adherence call to Mrs. Melannie Metzner [facility] Identifiers Verify spoke with patient she is past due on Atorvastatin 10 mg,patient exlplain she is no longer taking this medication,doctor took her off she was having side effects. Mrs. Tlatelpa is showing past due under [place].   [facility] Management Direct Dial [phone]  Fax [phone] [email or web address]

## 2019-04-13 ENCOUNTER — Other Ambulatory Visit: Payer: Self-pay | Admitting: Family Medicine

## 2019-04-13 DIAGNOSIS — I10 Essential (primary) hypertension: Secondary | ICD-10-CM

## 2019-05-20 ENCOUNTER — Other Ambulatory Visit: Payer: Self-pay | Admitting: Family Medicine

## 2019-05-20 DIAGNOSIS — I10 Essential (primary) hypertension: Secondary | ICD-10-CM

## 2019-06-01 ENCOUNTER — Other Ambulatory Visit: Payer: Self-pay

## 2019-06-01 ENCOUNTER — Telehealth: Payer: Self-pay | Admitting: Family Medicine

## 2019-06-01 ENCOUNTER — Encounter (INDEPENDENT_AMBULATORY_CARE_PROVIDER_SITE_OTHER): Payer: Self-pay | Admitting: Family Medicine

## 2019-06-01 ENCOUNTER — Ambulatory Visit (INDEPENDENT_AMBULATORY_CARE_PROVIDER_SITE_OTHER): Payer: Medicare Other | Admitting: Family Medicine

## 2019-06-01 VITALS — BP 181/84 | HR 68 | Temp 98.3°F | Ht 68.0 in | Wt 234.0 lb

## 2019-06-01 DIAGNOSIS — E559 Vitamin D deficiency, unspecified: Secondary | ICD-10-CM

## 2019-06-01 DIAGNOSIS — Z6835 Body mass index (BMI) 35.0-35.9, adult: Secondary | ICD-10-CM

## 2019-06-01 DIAGNOSIS — R5383 Other fatigue: Secondary | ICD-10-CM

## 2019-06-01 DIAGNOSIS — Z1331 Encounter for screening for depression: Secondary | ICD-10-CM | POA: Diagnosis not present

## 2019-06-01 DIAGNOSIS — E7800 Pure hypercholesterolemia, unspecified: Secondary | ICD-10-CM | POA: Diagnosis not present

## 2019-06-01 DIAGNOSIS — I10 Essential (primary) hypertension: Secondary | ICD-10-CM | POA: Diagnosis not present

## 2019-06-01 DIAGNOSIS — R0602 Shortness of breath: Secondary | ICD-10-CM

## 2019-06-01 DIAGNOSIS — Z0289 Encounter for other administrative examinations: Secondary | ICD-10-CM

## 2019-06-01 DIAGNOSIS — R7303 Prediabetes: Secondary | ICD-10-CM | POA: Diagnosis not present

## 2019-06-01 DIAGNOSIS — R748 Abnormal levels of other serum enzymes: Secondary | ICD-10-CM | POA: Diagnosis not present

## 2019-06-01 NOTE — Progress Notes (Signed)
Dear Dr. Adah Salvage,   Thank you for referring Doris Harris to our clinic. The following note includes my evaluation and treatment recommendations.  Chief Complaint:   OBESITY Doris Harris (MR# [id number]) is a 74 y.o. female who presents for evaluation and treatment of obesity and related comorbidities. Doris Harris was referred to our clinic by Doris Lot, DO. Current BMI is Body mass index is 35.58 kg/m.Doris Harris has been struggling with her weight for many years and has been unsuccessful in either losing weight, maintaining weight loss, or reaching her healthy weight goal.  Doris Harris is currently in the action stage of change and ready to dedicate time achieving and maintaining a healthier weight. Doris Harris is interested in becoming our patient and working on intensive lifestyle modifications including (but not limited to) diet and exercise for weight loss.  Doris Harris's habits were reviewed today and are as follows: her desired weight loss is 68 pounds, she retired 4 to 5 years ago and voices this is when weight gain started, her heaviest weight ever was 238 pounds, she has significant food cravings issues, she is skipping breakfast and lunch 3 times per week, she sometimes eats larger portions than normal, she has binge eating behaviors and she struggles with emotional eating.  Breakfast consists of 2 boiled eggs with a cup of green tea (feels full). She has 1 apple or orange for a snack. Between 3:00 and 4:00 PM Dinner consists of potato (1 cup), broccoli (1/2 cup), green beans (1 cup), turkey/chicken/seafood (2 pieces flounder or 6 shrimp).  Depression Screen Cina's Food and Mood (modified PHQ-9) score was negative. PHQ Score is 3  Depression screen PHQ 2/9 06/01/2019  Decreased Interest 0  Down, Depressed, Hopeless 0  PHQ - 2 Score 0  Altered sleeping 1  Tired, decreased energy 1  Change in appetite 1  Feeling bad or failure about yourself  0  Trouble concentrating 0  Moving slowly or  fidgety/restless 0  Suicidal thoughts 0  PHQ-9 Score 3  Difficult doing work/chores Not difficult at all   Subjective:   Other fatigue  Doris Harris admits to daytime somnolence and denies waking up still tired. Patent has a history of symptoms of daytime fatigue and hypertension. Doris Harris generally gets 8 hours of sleep per night, and states that she has generally restful sleep. Snoring is present. Apneic episodes are not present. Epworth Sleepiness Score is 1. EKG ordered today shows normal sinus rhythm at 64 BPM.  Shortness of breath on exertion  Breindel notes increasing shortness of breath with exercising and seems to be worsening over time with weight gain. She notes getting out of breath sooner with activity than she used to. This has not gotten worse recently. Staria denies shortness of breath at rest or orthopnea.  Essential hypertension Dierdra's blood pressure is uncontrolled today. Patient has had a diagnosis of hypertension for years. Patient is working with Dr. Sharee Holster (PCP) for blood pressure management. She denies chest pain, chest pressure or headache.  Prediabetes Doris Harris has a diagnosis of prediabetes and she has been diagnosed for 11 years at least (starting A1c 6.0, now at 5.7). There is no insulin level on file. She is attempting to work on diet and exercise to decrease her risk of diabetes.   Lab Results  Component Value Date   HGBA1C 5.7 (H) 01/12/2019   No results found for: INSULIN  Vitamin D deficiency Doris Harris's Vitamin D level was 41.7 on 02/21/18. She is not on vit D supplementation. She  admits fatigue.  Assessment/Plan:   Other fatigue  Doris Harris does feel that her weight is causing her energy to be lower than it should be. Fatigue may be related to obesity, depression or many other causes. Labs and EKG will be ordered, and in the meanwhile, Doris Harris will focus on self care including making healthy food choices, increasing physical activity and focusing on stress reduction.   Shortness of breath on exertion  Doris Harris does feel that she gets out of breath more easily that she used to when she exercises. Doris Harris's shortness of breath appears to be obesity related and exercise induced. She has agreed to work on weight loss and gradually increase exercise to treat her exercise induced shortness of breath. Labs and indirect calorimetry will be ordered today. We will continue to monitor closely.  Essential hypertension Doris Harris is working on healthy weight loss and exercise to improve blood pressure control. We will watch for signs of hypotension as she continues her lifestyle modifications. Doris Harris will follow up with Dr. Sharee Holster, and patient voices she will reach out to Dr. Sharee Holster for management.  Prediabetes  Doris Harris will begin to work on weight loss, exercise, and decreasing simple carbohydrates to help decrease the risk of diabetes. We will check Hgb A1c and insulin level today.  Vitamin D deficiency  Low Vitamin D level contributes to fatigue and are associated with obesity, breast, and colon cancer. We will check vitamin D level today.  Depression screening Doris Harris had a positive depression screening. Depression is commonly associated with obesity and often results in emotional eating behaviors. We will monitor this closely and work on CBT to help improve the non-hunger eating patterns. Referral to Psychology may be required if no improvement is seen as she continues in our clinic.  Class 2 severe obesity with serious comorbidity and body mass index (BMI) of 35.0 to 35.9 in adult, unspecified obesity type (HCC) Doris Harris is currently in the action stage of change and her goal is to continue with weight loss efforts. I recommend Doris Harris begin the structured treatment plan as follows:  She has agreed to the Category 2 Plan.  Exercise goals: Doris Harris will continue riding the stationary bike for 15 minutes, 4 days per week.   Behavioral modification strategies: increasing lean protein  intake, increasing vegetables, meal planning and cooking strategies, keeping healthy foods in the home and planning for success.  She was informed of the importance of frequent follow-up visits to maximize her success with intensive lifestyle modifications for her multiple health conditions. She was informed we would discuss her lab results at her next visit unless there is a critical issue that needs to be addressed sooner. Orelia agreed to keep her next visit at the agreed upon time to discuss these results.  Objective:   Blood pressure (!) 181/84, pulse 68, temperature 98.3 F (36.8 C), temperature source Oral, height 5\' 8"  (1.727 m), weight 234 lb (106.1 kg), SpO2 97 %. Body mass index is 35.58 kg/m.  EKG: Normal sinus rhythm, rate 64 BPM.  Indirect Calorimeter completed today shows a VO2 of 221 and a REE of 1537.  Her calculated basal metabolic rate is thus her basal metabolic rate is worse than expected.  General: Cooperative, alert, well developed, in no acute distress. HEENT: Conjunctivae and lids unremarkable. Cardiovascular: Regular rhythm.  Lungs: Normal work of breathing. Neurologic: No focal deficits.   Lab Results  Component Value Date   CREATININE 0.99 02/23/2019   BUN 13 02/23/2019   NA 140 02/23/2019  K 4.6 02/23/2019   CL 103 02/23/2019   CO2 27 02/23/2019   Lab Results  Component Value Date   ALT 25 02/23/2019   AST 23 02/23/2019   ALKPHOS 113 02/23/2019   BILITOT 0.5 02/23/2019   Lab Results  Component Value Date   HGBA1C 5.7 (H) 01/12/2019   HGBA1C 5.7 (H) 02/11/2018   HGBA1C 5.3 10/08/2017   HGBA1C 5.7 (H) 02/18/2017   HGBA1C 6.0 10/17/2016   No results found for: INSULIN Lab Results  Component Value Date   TSH 1.470 02/11/2018   Lab Results  Component Value Date   CHOL 212 (H) 01/12/2019   HDL 77 01/12/2019   LDLCALC 119 (H) 01/12/2019   LDLDIRECT 120.2 09/05/2012   TRIG 91 01/12/2019   CHOLHDL 2.8 01/12/2019   Lab Results   Component Value Date   WBC 6.0 02/11/2018   HGB 13.2 02/11/2018   HCT 38.5 02/11/2018   MCV 89 02/11/2018   PLT 206 02/11/2018   No results found for: IRON, TIBC, FERRITIN   Ref. Range 02/11/2018 08:56  Vitamin D, 25-Hydroxy Latest Ref Range: 30.0 - 100.0 ng/mL 41.7   Obesity Behavioral Intervention Visit Documentation for Insurance:   Approximately 15 minutes were spent on the discussion below.  ASK: We discussed the diagnosis of obesity with Aftyn today and Caren agreed to give [name] permission to discuss obesity behavioral modification therapy today.  ASSESS: Sharlene has the diagnosis of obesity and her BMI today is 35.59. Ajahnae is in the action stage of change.   ADVISE: Rhena was educated on the multiple health risks of obesity as well as the benefit of weight loss to improve her health. She was advised of the need for long term treatment and the importance of lifestyle modifications to improve her current health and to decrease her risk of future health problems.  AGREE: Multiple dietary modification options and treatment options were discussed and Elantra agreed to follow the recommendations documented in the above note.  ARRANGE: Kaeya was educated on the importance of frequent visits to treat obesity as outlined per CMS and USPSTF guidelines and agreed to schedule her next follow up appointment today.  Attestation Statements:   This is the patient's first visit at [facility]. The patient's NEW PATIENT PACKET was reviewed at length. Included in the packet: current and past health history, medications, allergies, ROS, gynecologic history (women only), surgical history, family history, social history, weight history, weight loss surgery history (for those that have had weight loss surgery), nutritional evaluation, mood and food questionnaire, PHQ9, Epworth questionnaire, sleep habits questionnaire, patient life and health improvement goals questionnaire. These will  all be scanned into the patient's chart under media.   During the visit, I independently reviewed the patient's EKG, bioimpedance scale results, and indirect calorimeter results. I used this information to tailor a meal plan for the patient that will help her to lose weight and will improve her obesity-related conditions going forward. I performed a medically necessary appropriate examination and/or evaluation. I discussed the assessment and treatment plan with the patient. The patient was provided an opportunity to ask questions and all were answered. The patient agreed with the plan and demonstrated an understanding of the instructions. Labs were ordered at this visit and will be reviewed at the next visit unless more critical results need to be addressed immediately. Clinical information was updated and documented in the EMR.   Time spent on visit including pre-visit chart review and post-visit care was  50 minutes.   A separate 15 minutes was spent on risk counseling (see above).    I, [place] Crane, am acting as transcriptionist for Reuben Likes, MD.  I have reviewed the above documentation for accuracy and completeness, and I agree with the above. - Debbra Riding, MD

## 2019-06-01 NOTE — Telephone Encounter (Signed)
Patient called states was advised to contact PCP over elevated  BP while @ wt loss mgt OV 06/01/19.  --Scheduled pt to see provider 3/1 (1st available) & advised her to seek immediate medical attention & forwarding message to med asst to contact pt tomorrow (06/01/19).  --glh

## 2019-06-02 LAB — COMPREHENSIVE METABOLIC PANEL
ALT: 22 IU/L (ref 0–32)
AST: 19 IU/L (ref 0–40)
Albumin/Globulin Ratio: 1.4 (ref 1.2–2.2)
Albumin: 4.3 g/dL (ref 3.7–4.7)
Alkaline Phosphatase: 128 IU/L — ABNORMAL HIGH (ref 39–117)
BUN/Creatinine Ratio: 14 (ref 12–28)
BUN: 12 mg/dL (ref 8–27)
Bilirubin Total: 0.7 mg/dL (ref 0.0–1.2)
CO2: 23 mmol/L (ref 20–29)
Calcium: 9.6 mg/dL (ref 8.7–10.3)
Chloride: 102 mmol/L (ref 96–106)
Creatinine, Ser: 0.83 mg/dL (ref 0.57–1.00)
GFR calc Af Amer: 81 mL/min/{1.73_m2} (ref >59)
GFR calc non Af Amer: 71 mL/min/{1.73_m2} (ref >59)
Globulin, Total: 3.1 g/dL (ref 1.5–4.5)
Glucose: 84 mg/dL (ref 65–99)
Potassium: 4.2 mmol/L (ref 3.5–5.2)
Sodium: 139 mmol/L (ref 134–144)
Total Protein: 7.4 g/dL (ref 6.0–8.5)

## 2019-06-02 LAB — CBC WITH DIFFERENTIAL/PLATELET
Basophils Absolute: 0.1 10*3/uL (ref 0.0–0.2)
Basos: 1 %
EOS (ABSOLUTE): 0.1 10*3/uL (ref 0.0–0.4)
Eos: 2 %
Hematocrit: 42.4 % (ref 34.0–46.6)
Hemoglobin: 14.3 g/dL (ref 11.1–15.9)
Immature Grans (Abs): 0 10*3/uL (ref 0.0–0.1)
Immature Granulocytes: 0 %
Lymphocytes Absolute: 2.2 10*3/uL (ref 0.7–3.1)
Lymphs: 36 %
MCH: 31 pg (ref 26.6–33.0)
MCHC: 33.7 g/dL (ref 31.5–35.7)
MCV: 92 fL (ref 79–97)
Monocytes Absolute: 0.4 10*3/uL (ref 0.1–0.9)
Monocytes: 7 %
Neutrophils Absolute: 3.3 10*3/uL (ref 1.4–7.0)
Neutrophils: 54 %
Platelets: 186 10*3/uL (ref 150–450)
RBC: 4.61 x10E6/uL (ref 3.77–5.28)
RDW: 12.9 % (ref 11.7–15.4)
WBC: 6.1 10*3/uL (ref 3.4–10.8)

## 2019-06-02 LAB — LIPID PANEL WITH LDL/HDL RATIO
Cholesterol, Total: 233 mg/dL — ABNORMAL HIGH (ref 100–199)
HDL: 86 mg/dL (ref >39)
LDL Chol Calc (NIH): 134 mg/dL — ABNORMAL HIGH (ref 0–99)
LDL/HDL Ratio: 1.6 ratio (ref 0.0–3.2)
Triglycerides: 78 mg/dL (ref 0–149)
VLDL Cholesterol Cal: 13 mg/dL (ref 5–40)

## 2019-06-02 LAB — T3: T3, Total: 97 ng/dL (ref 71–180)

## 2019-06-02 LAB — INSULIN, RANDOM: INSULIN: 8.7 u[IU]/mL (ref 2.6–24.9)

## 2019-06-02 LAB — HEMOGLOBIN A1C
Est. average glucose Bld gHb Est-mCnc: 114 mg/dL
Hgb A1c MFr Bld: 5.6 % (ref 4.8–5.6)

## 2019-06-02 LAB — TSH: TSH: 1.46 u[IU]/mL (ref 0.450–4.500)

## 2019-06-02 LAB — T4, FREE: Free T4: 1.33 ng/dL (ref 0.82–1.77)

## 2019-06-02 LAB — VITAMIN D 25 HYDROXY (VIT D DEFICIENCY, FRACTURES): Vit D, 25-Hydroxy: 36.7 ng/mL (ref 30.0–100.0)

## 2019-06-02 NOTE — Telephone Encounter (Signed)
Patient states BP 193/64 she thinks but when they rechecked the BP it had gone down but she doesn't remember what the 2nd reading was. Patient denies being symptomatic with elevated BP.  Advised patient provider is out of the office until Wednesday and that I would send her a message but that if she has any symptoms: severe headache, SOB, nosebleeds, dizziness to seek immediate care at ED or UC. Patient verbalized understanding. AS, CMA

## 2019-06-03 NOTE — Telephone Encounter (Signed)
Additionally, patient was last seen in November by [name] with elevated blood pressures then.   She was told to increase her olmesartan to 40 mg and in 4 weeks give [name] a call with her blood pressure and pulse logs.   She never did.  Please remind patient the importance of ambulatory blood pressure monitoring versus having her come into the office frequently.      Per my office visit note in Nov 2020 below: - Last visit, decreased dose of olmesartan, increased metoprolol, and added verapamil. - Increase dose of olmesartan to 40.   - Otherwise continue treatment as prescribed.  See med list.   - In 4 weeks, patient will call with BP log to update [name] regarding BP and pulse. - If BP remains uncontrolled, will consider addition of  hydralazine or clonidine I nfuture.

## 2019-06-03 NOTE — Telephone Encounter (Signed)
It appears her blood pressure at the office, at least what is documented is 181/84.  I do not see a second blood pressure reading there.  This is borderline s hypertensive urgency without any evidence of acute symptoms or new onset of any symptoms.     -Agreed with the previous advice given to patient that if she develops any symptoms with blood pressure over 180 systolic or 110 diastolic, she is to seek immediate attention such as ER or urgent care.     Otherwise since she is asymptomatic, please advise patient that she should be checking her blood pressures and pulses and writing it down in a book or piece of paper that she can keep with her and have available next appointment for [name] to review.   I recommend she check it twice daily after sitting for 10 to 15 minutes quietly, legs uncrossed etc. and write down the numbers.    Otherwise, have her keep follow-up office visit that was given to her on 3/1

## 2019-06-05 NOTE — Telephone Encounter (Signed)
Left message for patient to call back regarding below. AS, CMA 

## 2019-06-08 ENCOUNTER — Ambulatory Visit (INDEPENDENT_AMBULATORY_CARE_PROVIDER_SITE_OTHER): Payer: Medicare Other | Admitting: Family Medicine

## 2019-06-08 ENCOUNTER — Encounter: Payer: Self-pay | Admitting: Family Medicine

## 2019-06-08 ENCOUNTER — Other Ambulatory Visit: Payer: Self-pay

## 2019-06-08 VITALS — BP 171/86 | HR 76 | Temp 98.1°F | Resp 12 | Ht 67.72 in | Wt 235.0 lb

## 2019-06-08 DIAGNOSIS — I10 Essential (primary) hypertension: Secondary | ICD-10-CM

## 2019-06-08 DIAGNOSIS — I160 Hypertensive urgency: Secondary | ICD-10-CM | POA: Diagnosis not present

## 2019-06-08 DIAGNOSIS — R7303 Prediabetes: Secondary | ICD-10-CM

## 2019-06-08 DIAGNOSIS — E785 Hyperlipidemia, unspecified: Secondary | ICD-10-CM

## 2019-06-08 DIAGNOSIS — Z5320 Procedure and treatment not carried out because of patient's decision for unspecified reasons: Secondary | ICD-10-CM | POA: Insufficient documentation

## 2019-06-08 DIAGNOSIS — E559 Vitamin D deficiency, unspecified: Secondary | ICD-10-CM

## 2019-06-08 MED ORDER — VERAPAMIL HCL ER 180 MG PO TBCR: 180.0000 mg | EXTENDED_RELEASE_TABLET | Freq: Every day | ORAL | 0 refills | Status: DC

## 2019-06-08 MED ORDER — METOPROLOL SUCCINATE ER 100 MG PO TB24: 100.0000 mg | ORAL_TABLET | Freq: Every day | ORAL | 0 refills | Status: DC

## 2019-06-08 MED ORDER — OLMESARTAN MEDOXOMIL 40 MG PO TABS: 40.0000 mg | ORAL_TABLET | Freq: Every day | ORAL | 0 refills | Status: DC

## 2019-06-08 NOTE — Progress Notes (Signed)
Impression and Recommendations:    1. Asymptomatic hypertensive urgency   2. Morbid obesity (HCC)   3. Prediabetes   4. Hyperlipidemia, unspecified hyperlipidemia type-  declines all meds   5. Statin declined   6. Vitamin D deficiency   7. Essential hypertension     Asymptomatic Hypertensive Urgency  - Dose of olmesartan increased to 40 on 02/23/2019.  - Patient was told to let us know in 4 weeks what her BP was running at home, but patient was lost to follow-up, and never let us know.  - Blood pressure currently is not at goal.  - Denies headache, dizziness, visual changes-she tells me she is completely asymptomatic.  - Discussed critical need to increase dosage of existing medication and/or incorporate another medication, and also she will need to engage in intensive lifestyle changes to better control BP.  - Reviewed patient's current dosages and treatment plan extensively during appointment.  - Patient would not like to start a new medicine and prefers to go to hypertension clinic instead.  -Due to still poorly controlled BP despite being on several BP meds, with having failed several others, we discussed referral to cardiology's hypertension clinic today.  Advised patient that cardiology will be able to evaluate patient for presence of additional causes of hypertension such as renal artery stenosis etc.  - Patient prefers to defer renal artery scan/ any imaging until she speaks with specialist.  - For additional control of BP, dosage of verapamil increased to 180 mg today.  See med list. - To help reduce S-E of stomach pain and constipation, advised patient to take her verapamil with or just after food or a meal, and increase her daily hydration (which patient admittedly does not drink any water).  - Prior to clinical decisions being made I counseled patient on pathophysiology of disease and discussed various treatment options, which always includes dietary and  lifestyle modification as first line.   - Lifestyle changes such as dash and heart healthy diets and engaging in a regular intensive exercise program discussed extensively with patient.   -Encouraged to continue weight loss which she is going to the clinic for every 2 weeks and just started with Dr. Glennon Mac.  - Ambulatory blood pressure monitoring encouraged at least 3 times weekly. Keep log and bring in every office visit.  Reminded patient that if they ever feel poorly in any way, to check their blood pressure and pulse.  - We will continue to monitor.   BMI Counseling - Body mass index is 36.03 kg/m  Explained to patient what BMI refers to, and what it means medically.    Told patient to think about it as a "medical risk stratification measurement" and how increasing BMI is associated with increasing risk/ or worsening state of various diseases such as hypertension, hyperlipidemia, diabetes, premature OA, depression etc.  Health counseling performed.  All questions answered.  - Encouraged patient to continue following up with [facility] as established.   Lifestyle & Preventative Health Maintenance - Advised patient to continue working toward exercising to improve overall mental, physical, and emotional health.    - Reviewed the "spokes of the wheel" of mood and health management.  Stressed the importance of ongoing prudent habits, including regular exercise, appropriate sleep hygiene, healthful dietary habits, and prayer/meditation to relax.  - Encouraged patient to engage in daily physical activity as tolerated, especially a formal exercise routine.  Recommended that the patient eventually strive for at least 150  minutes of moderate cardiovascular activity per week according to guidelines established by the AHA.   - Healthy dietary habits encouraged, including low-carb, and high amounts of lean protein in diet.   - Patient should also consume adequate amounts of  water.   Education and routine counseling performed. Handouts provided.   Orders Placed This Encounter  Procedures   Ambulatory referral to Cardiology    Meds ordered this encounter  Medications   verapamil (CALAN-SR) 180 MG CR tablet    Sig: Take 1 tablet (180 mg total) by mouth at bedtime.    Dispense:  90 tablet    Refill:  0    Requesting 1 year supply   metoprolol succinate (TOPROL-XL) 100 MG 24 hr tablet    Sig: Take 1 tablet (100 mg total) by mouth daily. RF by cards in future    Dispense:  90 tablet    Refill:  0    Requesting 1 year supply   olmesartan (BENICAR) 40 MG tablet    Sig: Take 1 tablet (40 mg total) by mouth daily. RF by cards in future    Dispense:  90 tablet    Refill:  0    Requesting 1 year supply    Medications Discontinued During This Encounter  Medication Reason   olmesartan (BENICAR) 40 MG tablet Reorder   metoprolol succinate (TOPROL-XL) 100 MG 24 hr tablet Reorder   verapamil (CALAN-SR) 120 MG CR tablet Reorder     The patient was counseled, risk factors were discussed, anticipatory guidance given.  Gross side effects, risk and benefits, and alternatives of medications discussed with patient.  Patient is aware that all medications have potential side effects and we are unable to predict every side effect or drug-drug interaction that may occur.  Expresses verbal understanding and consents to current therapy plan and treatment regimen.   Return in 4 months (on 10/08/2019) for MMP, today we inc verapamil dose; unless cannot get in to see cards w/in the next 31mo, then f/up 1 mo w [name] for BP.   Please see AVS handed out to patient at the end of our visit for further patient instructions/ counseling done pertaining to today's office visit.    Note:  This document was prepared using Dragon voice recognition software and may include unintentional dictation errors.   The 21st Century Cures Act was signed into law in 2016 which includes the  topic of electronic health records.  This provides immediate access to information in MyChart.  This includes consultation notes, operative notes, office notes, lab results and pathology reports.  If you have any questions about what you read please let us know at your next visit or call us at the office.  We are right here with you.   This case required medical decision making of at least moderate complexity.  This document serves as a record of services personally performed by Thomasene Lot, DO. It was created on her behalf by Peggye Fothergill, a trained medical scribe. The creation of this record is based on the scribe's personal observations and the provider's statements to them.   This case required medical decision making of at least moderate complexity. The above documentation from Peggye Fothergill, medical scribe, has been reviewed by Carlye Grippe, D.O.     Subjective:     I, Peggye Fothergill, am serving as Neurosurgeon for [employer].   HPI: Doris Harris is a 74 y.o. female who presents to [facility]  today for follow up of CHRONIC CONDITIONS.     - Weight Loss Goals Notes Dr. Rinaldo Ratel is working with her regarding her diet, "portion control, I think."  Says on her scales at home, she's lost a pound since last week.  She returns to [facility] on March 8.  Notes she drinks "quite a bit of water" per day, stating about 3-4 bottles.   HTN:  -  Her blood pressure has not been controlled at home.  Pt has not been checking it regularly.  States she believes her BP runs "190-something over 80's and 90's."  - Patient reports good compliance with blood pressure medications.  Says she feels dizzy when she stands up quickly "sometimes, but not often."  - Says that the verapamil makes her stomach hurt.  She has continued to take it.  Her main concern is the side-effect of constipation on the verapamil.  Patient's main concern  with her management is taking multiple medications.  States that she has never been the type of person to take a lot of medications, and she knows that they can cause side effects.  - She denies new onset of: chest pain, exercise intolerance, shortness of breath, dizziness, visual changes, headache, lower extremity swelling or claudication.   Last 3 blood pressure readings in our office are as follows: BP Readings from Last 3 Encounters:  06/08/19 (!) 171/86  06/01/19 (!) 181/84  02/23/19 (!) 190/97    Pulse Readings from Last 3 Encounters:  06/08/19 76  06/01/19 68  02/23/19 71    Filed Weights   06/08/19 0849  Weight: 235 lb (106.6 kg)      Patient Care Team    Relationship Specialty Notifications Start End  Thomasene Lot, DO PCP - General Family Medicine  01/26/16      Lab Results  Component Value Date   CREATININE 0.83 06/01/2019   BUN 12 06/01/2019   NA 139 06/01/2019   K 4.2 06/01/2019   CL 102 06/01/2019   CO2 23 06/01/2019    Lab Results  Component Value Date   CHOL 233 (H) 06/01/2019   CHOL 212 (H) 01/12/2019   CHOL 238 (H) 02/11/2018    Lab Results  Component Value Date   HDL 86 06/01/2019   HDL 77 01/12/2019   HDL 87 02/11/2018    Lab Results  Component Value Date   LDLCALC 134 (H) 06/01/2019   LDLCALC 119 (H) 01/12/2019   LDLCALC 133 (H) 02/11/2018    Lab Results  Component Value Date   TRIG 78 06/01/2019   TRIG 91 01/12/2019   TRIG 91 02/11/2018    Lab Results  Component Value Date   CHOLHDL 2.8 01/12/2019   CHOLHDL 2.7 02/11/2018   CHOLHDL 2.6 02/18/2017    Lab Results  Component Value Date   LDLDIRECT 120.2 09/05/2012   LDLDIRECT 110.8 07/17/2011   LDLDIRECT 119.6 02/01/2010    Recent Results (from the past 2160 hour(s))  CBC with Differential/Platelet     Status: None   Collection Time: 06/01/19 12:39 PM  Result Value Ref Range   WBC 6.1 3.4 - 10.8 x10E3/uL   RBC 4.61 3.77 - 5.28 x10E6/uL   Hemoglobin 14.3 11.1  - 15.9 g/dL   Hematocrit 93.7 34.2 - 46.6 %   MCV 92 79 - 97 fL   MCH 31.0 26.6 - 33.0 pg   MCHC 33.7 31.5 - 35.7 g/dL   RDW 87.6 81.1 - 57.2 %   Platelets  186 150 - 450 x10E3/uL   Neutrophils 54 Not Estab. %   Lymphs 36 Not Estab. %   Monocytes 7 Not Estab. %   Eos 2 Not Estab. %   Basos 1 Not Estab. %   Neutrophils Absolute 3.3 1.4 - 7.0 x10E3/uL   Lymphocytes Absolute 2.2 0.7 - 3.1 x10E3/uL   Monocytes Absolute 0.4 0.1 - 0.9 x10E3/uL   EOS (ABSOLUTE) 0.1 0.0 - 0.4 x10E3/uL   Basophils Absolute 0.1 0.0 - 0.2 x10E3/uL   Immature Granulocytes 0 Not Estab. %   Immature Grans (Abs) 0.0 0.0 - 0.1 x10E3/uL  Comprehensive metabolic panel     Status: Abnormal   Collection Time: 06/01/19 12:39 PM  Result Value Ref Range   Glucose 84 65 - 99 mg/dL   BUN 12 8 - 27 mg/dL   Creatinine, Ser 1.610.83 0.57 - 1.00 mg/dL   GFR calc non Af Amer 71 >59 mL/min/1.73   GFR calc Af Amer 81 >59 mL/min/1.73   BUN/Creatinine Ratio 14 12 - 28   Sodium 139 134 - 144 mmol/L   Potassium 4.2 3.5 - 5.2 mmol/L   Chloride 102 96 - 106 mmol/L   CO2 23 20 - 29 mmol/L   Calcium 9.6 8.7 - 10.3 mg/dL   Total Protein 7.4 6.0 - 8.5 g/dL   Albumin 4.3 3.7 - 4.7 g/dL   Globulin, Total 3.1 1.5 - 4.5 g/dL   Albumin/Globulin Ratio 1.4 1.2 - 2.2   Bilirubin Total 0.7 0.0 - 1.2 mg/dL   Alkaline Phosphatase 128 (H) 39 - 117 IU/L   AST 19 0 - 40 IU/L   ALT 22 0 - 32 IU/L  Hemoglobin A1c     Status: None   Collection Time: 06/01/19 12:39 PM  Result Value Ref Range   Hgb A1c MFr Bld 5.6 4.8 - 5.6 %    Comment:          Prediabetes: 5.7 - 6.4          Diabetes: >6.4          Glycemic control for adults with diabetes: <7.0    Est. average glucose Bld gHb Est-mCnc 114 mg/dL  Insulin, random     Status: None   Collection Time: 06/01/19 12:39 PM  Result Value Ref Range   INSULIN 8.7 2.6 - 24.9 uIU/mL  T3     Status: None   Collection Time: 06/01/19 12:39 PM  Result Value Ref Range   T3, Total 97 71 - 180 ng/dL  T4,  free     Status: None   Collection Time: 06/01/19 12:39 PM  Result Value Ref Range   Free T4 1.33 0.82 - 1.77 ng/dL  TSH     Status: None   Collection Time: 06/01/19 12:39 PM  Result Value Ref Range   TSH 1.460 0.450 - 4.500 uIU/mL  Lipid Panel With LDL/HDL Ratio     Status: Abnormal   Collection Time: 06/01/19 12:39 PM  Result Value Ref Range   Cholesterol, Total 233 (H) 100 - 199 mg/dL   Triglycerides 78 0 - 149 mg/dL   HDL 86 >09>39 mg/dL   VLDL Cholesterol Cal 13 5 - 40 mg/dL   LDL Chol Calc (NIH) 604134 (H) 0 - 99 mg/dL   LDL/HDL Ratio 1.6 0.0 - 3.2 ratio    Comment:  LDL/HDL Ratio                                             Men  Women                               1/2 Avg.Risk  1.0    1.5                                   Avg.Risk  3.6    3.2                                2X Avg.Risk  6.2    5.0                                3X Avg.Risk  8.0    6.1   VITAMIN D 25 Hydroxy (Vit-D Deficiency, Fractures)     Status: None   Collection Time: 06/01/19 12:39 PM  Result Value Ref Range   Vit D, 25-Hydroxy 36.7 30.0 - 100.0 ng/mL    Comment: Vitamin D deficiency has been defined by the [place] practice guideline as a level of serum 25-OH vitamin D less than 20 ng/mL (1,2). The Endocrine Society went on to further define vitamin D insufficiency as a level between 21 and 29 ng/mL (2). 1. IOM (Institute of Medicine). 2010. Dietary reference    intakes for calcium and D. [place]: The    [employer]. 2. Holick MF, Binkley Merna, Bischoff-Ferrari HA, et al.    Evaluation, treatment, and prevention of vitamin D    deficiency: an Endocrine Society clinical practice    guideline. JCEM. 2011 Jul; 96(7):1911-30.     ===================================================================   Patient Active Problem List   Diagnosis Date Noted   Obesity, Class I, BMI 30-34.9 01/25/2016   Prediabetes  01/25/2016   Elevated LDL cholesterol level 01/25/2016   Essential hypertension 12/02/2014   Statin declined 06/08/2019   Asymptomatic hypertensive urgency 06/08/2019   Morbid obesity ([place]) 02/23/2019   Stress and adjustment reaction 02/23/2019   Elevated HDL 10/08/2017   Vitamin D insufficiency 10/08/2017   Patellofemoral arthritis of left knee 08/14/2017   Acute pain of left knee 08/14/2017   Screening for colon cancer 09/01/2013   Counseling on health promotion and disease prevention 09/01/2013   URINALYSIS, ABNORMAL 02/08/2010     Past Medical History:  Diagnosis Date   Back pain    Hypertension    IFG (impaired fasting glucose)    x1   Joint pain    Obesity    Prediabetes    Vitamin D deficiency      Past Surgical History:  Procedure Laterality Date   MM BREAST STEREO BX*L*R/S     TOTAL ABDOMINAL HYSTERECTOMY  1997   fibroid   uterine polyps     WRIST FRACTURE SURGERY  2000     Family History  Problem Relation Age of Onset   Hypertension Mother    Hyperlipidemia Mother    Heart disease Mother    Hypertension Father    Hypertension Brother  Diabetes Maternal Grandmother    Coronary artery disease Other    Hypertension Other    Asthma Other    Hypertension Brother    Hypertension Sister    Healthy Son    Hypertension Sister    Healthy Sister    Hypertension Brother    Healthy Brother    Healthy Brother    Healthy Brother    Heart attack Brother      Social History   Substance and Sexual Activity  Drug Use No  ,  Social History   Substance and Sexual Activity  Alcohol Use No  ,  Social History   Tobacco Use  Smoking Status Never Smoker  Smokeless Tobacco Never Used  ,    Current Outpatient Medications on File Prior to Visit  Medication Sig Dispense Refill   Cholecalciferol (VITAMIN D3 PO) Take by mouth.     No current facility-administered medications on file prior to visit.      Allergies  Allergen Reactions   Amlodipine Nausea And Vomiting   Hctz [Hydrochlorothiazide] Other (See Comments)    Freq urination- 10+ times/ day   Ibuprofen     REACTION: unspecified   Lipitor [Atorvastatin Calcium] Other (See Comments)    Joint pain   Lisinopril Cough   Benazepril Rash   Valsartan-Hydrochlorothiazide Rash     Review of Systems:   General:  Denies fever, chills Optho/Auditory:   Denies visual changes, blurred vision Respiratory:   Denies SOB, cough, wheeze, DIB  Cardiovascular:   Denies chest pain, palpitations, painful respirations Gastrointestinal:   Denies nausea, vomiting, diarrhea.  Endocrine:     Denies new hot or cold intolerance Musculoskeletal:  Denies joint swelling, gait issues, or new unexplained myalgias/ arthralgias Skin:  Denies rash, suspicious lesions  Neurological:    Denies dizziness, unexplained weakness, numbness  Psychiatric/Behavioral:   Denies mood changes  Objective:    Blood pressure (!) 171/86, pulse 76, temperature 98.1 F (36.7 C), temperature source Oral, resp. rate 12, height 5' 7.72" (1.72 m), weight 235 lb (106.6 kg), SpO2 96 %.  Body mass index is 36.03 kg/m.  General: Well Developed, well nourished, and in no acute distress.  HEENT: Normocephalic, atraumatic, pupils equal round reactive to light, neck supple, No carotid bruits, no JVD Skin: Warm and dry, cap RF less 2 sec Cardiac: Regular rate and rhythm, S1, S2 WNL's, no murmurs rubs or gallops Respiratory: ECTA B/L, Not using accessory muscles, speaking in full sentences. NeuroM-Sk: Ambulates w/o assistance, moves ext * 4 w/o difficulty, sensation grossly intact.  Ext: scant edema b/l lower ext Psych: No HI/SI, judgement and insight good, Euthymic mood. Full Affect.

## 2019-06-08 NOTE — Patient Instructions (Signed)
Your goal blood pressure should be 130/80 or less on a regular basis, or medications should be started/ modified.    Normal blood pressure is less than 120/80.    Hypertension Hypertension, commonly called high blood pressure, is when the force of blood pumping through the arteries is too strong. The arteries are the blood vessels that carry blood from the heart throughout the body. Hypertension forces the heart to work harder to pump blood and may cause arteries to become narrow or stiff. Having untreated or uncontrolled hypertension can cause heart attacks, strokes, kidney disease, and other problems. A blood pressure reading consists of a higher number over a lower number. Ideally, your blood pressure should be below 120/80. The first ("top") number is called the systolic pressure. It is a measure of the pressure in your arteries as your heart beats. The second ("bottom") number is called the diastolic pressure. It is a measure of the pressure in your arteries as the heart relaxes. What are the causes? The cause of this condition is not known. What increases the risk? Some risk factors for high blood pressure are under your control. Others are not. Factors you can change  Smoking.  Having type 2 diabetes mellitus, high cholesterol, or both.  Not getting enough exercise or physical activity.  Being overweight.  Having too much fat, sugar, calories, or salt (sodium) in your diet.  Drinking too much alcohol. Factors that are difficult or impossible to change  Having chronic kidney disease.  Having a family history of high blood pressure.  Age. Risk increases with age.  Race. You may be at higher risk if you are African-American.  Gender. Men are at higher risk than women before age 45. After age 65, women are at higher risk than men.  Having obstructive sleep apnea.  Stress. What are the signs or symptoms? Extremely high blood pressure (hypertensive crisis) may cause:   Headache.  Anxiety.  Shortness of breath.  Nosebleed.  Nausea and vomiting.  Severe chest pain.  Jerky movements you cannot control (seizures).  How is this diagnosed? This condition is diagnosed by measuring your blood pressure while you are seated, with your arm resting on a surface. The cuff of the blood pressure monitor will be placed directly against the skin of your upper arm at the level of your heart. It should be measured at least twice using the same arm. Certain conditions can cause a difference in blood pressure between your right and left arms. Certain factors can cause blood pressure readings to be lower or higher than normal (elevated) for a short period of time:  When your blood pressure is higher when you are in a health care provider's office than when you are at home, this is called white coat hypertension. Most people with this condition do not need medicines.  When your blood pressure is higher at home than when you are in a health care provider's office, this is called masked hypertension. Most people with this condition may need medicines to control blood pressure.  If you have a high blood pressure reading during one visit or you have normal blood pressure with other risk factors:  You may be asked to return on a different day to have your blood pressure checked again.  You may be asked to monitor your blood pressure at home for 1 week or longer.  If you are diagnosed with hypertension, you may have other blood or imaging tests to help your health care provider understand   your overall risk for other conditions. How is this treated? This condition is treated by making healthy lifestyle changes, such as eating healthy foods, exercising more, and reducing your alcohol intake. Your health care provider may prescribe medicine if lifestyle changes are not enough to get your blood pressure under control, and if:  Your systolic blood pressure is above 130.  Your  diastolic blood pressure is above 80.  Your personal target blood pressure may vary depending on your medical conditions, your age, and other factors. Follow these instructions at home: Eating and drinking  Eat a diet that is high in fiber and potassium, and low in sodium, added sugar, and fat. An example eating plan is called the DASH (Dietary Approaches to Stop Hypertension) diet. To eat this way: ? Eat plenty of fresh fruits and vegetables. Try to fill half of your plate at each meal with fruits and vegetables. ? Eat whole grains, such as whole wheat pasta, brown rice, or whole grain bread. Fill about one quarter of your plate with whole grains. ? Eat or drink low-fat dairy products, such as skim milk or low-fat yogurt. ? Avoid fatty cuts of meat, processed or cured meats, and poultry with skin. Fill about one quarter of your plate with lean proteins, such as fish, chicken without skin, beans, eggs, and tofu. ? Avoid premade and processed foods. These tend to be higher in sodium, added sugar, and fat.  Reduce your daily sodium intake. Most people with hypertension should eat less than 1,500 mg of sodium a day.  Limit alcohol intake to no more than 1 drink a day for nonpregnant women and 2 drinks a day for men. One drink equals 12 oz of beer, 5 oz of wine, or 1 oz of hard liquor. Lifestyle  Work with your health care provider to maintain a healthy body weight or to lose weight. Ask what an ideal weight is for you.  Get at least 30 minutes of exercise that causes your heart to beat faster (aerobic exercise) most days of the week. Activities may include walking, swimming, or biking.  Include exercise to strengthen your muscles (resistance exercise), such as pilates or lifting weights, as part of your weekly exercise routine. Try to do these types of exercises for 30 minutes at least 3 days a week.  Do not use any products that contain nicotine or tobacco, such as cigarettes and e-cigarettes.  If you need help quitting, ask your health care provider.  Monitor your blood pressure at home as told by your health care provider.  Keep all follow-up visits as told by your health care provider. This is important. Medicines  Take over-the-counter and prescription medicines only as told by your health care provider. Follow directions carefully. Blood pressure medicines must be taken as prescribed.  Do not skip doses of blood pressure medicine. Doing this puts you at risk for problems and can make the medicine less effective.  Ask your health care provider about side effects or reactions to medicines that you should watch for. Contact a health care provider if:  You think you are having a reaction to a medicine you are taking.  You have headaches that keep coming back (recurring).  You feel dizzy.  You have swelling in your ankles.  You have trouble with your vision. Get help right away if:  You develop a severe headache or confusion.  You have unusual weakness or numbness.  You feel faint.  You have severe pain in your chest   or abdomen.  You vomit repeatedly.  You have trouble breathing. Summary  Hypertension is when the force of blood pumping through your arteries is too strong. If this condition is not controlled, it may put you at risk for serious complications.  Your personal target blood pressure may vary depending on your medical conditions, your age, and other factors. For most people, a normal blood pressure is less than 120/80.  Hypertension is treated with lifestyle changes, medicines, or a combination of both. Lifestyle changes include weight loss, eating a healthy, low-sodium diet, exercising more, and limiting alcohol. This information is not intended to replace advice given to you by your health care provider. Make sure you discuss any questions you have with your health care provider. Document Released: 03/26/2005 Document Revised: 02/22/2016 Document  Reviewed: 02/22/2016 Elsevier Interactive Patient Education  2018 Elsevier Inc.    How to Take Your Blood Pressure   Blood pressure is a measurement of how strongly your blood is pressing against the walls of your arteries. Arteries are blood vessels that carry blood from your heart throughout your body. Your health care provider takes your blood pressure at each office visit. You can also take your own blood pressure at home with a blood pressure machine. You may need to take your own blood pressure:  To confirm a diagnosis of high blood pressure (hypertension).  To monitor your blood pressure over time.  To make sure your blood pressure medicine is working.  Supplies needed: To take your blood pressure, you will need a blood pressure machine. You can buy a blood pressure machine, or blood pressure monitor, at most drugstores or online. There are several types of home blood pressure monitors. When choosing one, consider the following:  Choose a monitor that has an arm cuff.  Choose a monitor that wraps snugly around your upper arm. You should be able to fit only one finger between your arm and the cuff.  Do not choose a monitor that measures your blood pressure from your wrist or finger.  Your health care provider can suggest a reliable monitor that will meet your needs. How to prepare To get the most accurate reading, avoid the following for 30 minutes before you check your blood pressure:  Drinking caffeine.  Drinking alcohol.  Eating.  Smoking.  Exercising.  Five minutes before you check your blood pressure:  Empty your bladder.  Sit quietly without talking in a dining chair, rather than in a soft couch or armchair.  How to take your blood pressure To check your blood pressure, follow the instructions in the manual that came with your blood pressure monitor. If you have a digital blood pressure monitor, the instructions may be as follows: 1. Sit up straight. 2.  Place your feet on the floor. Do not cross your ankles or legs. 3. Rest your left arm at the level of your heart on a table or desk or on the arm of a chair. 4. Pull up your shirt sleeve. 5. Wrap the blood pressure cuff around the upper part of your left arm, 1 inch (2.5 cm) above your elbow. It is best to wrap the cuff around bare skin. 6. Fit the cuff snugly around your arm. You should be able to place only one finger between the cuff and your arm. 7. Position the cord inside the groove of your elbow. 8. Press the power button. 9. Sit quietly while the cuff inflates and deflates. 10. Read the digital reading on the monitor   screen and write it down (record it). 11. Wait 2-3 minutes, then repeat the steps, starting at step 1.  What does my blood pressure reading mean? A blood pressure reading consists of a higher number over a lower number. Ideally, your blood pressure should be below 120/80. The first ("top") number is called the systolic pressure. It is a measure of the pressure in your arteries as your heart beats. The second ("bottom") number is called the diastolic pressure. It is a measure of the pressure in your arteries as the heart relaxes. Blood pressure is classified into four stages. The following are the stages for adults who do not have a short-term serious illness or a chronic condition. Systolic pressure and diastolic pressure are measured in a unit called mm Hg. Normal  Systolic pressure: below 120.  Diastolic pressure: below 80. Elevated  Systolic pressure: 120-129.  Diastolic pressure: below 80. Hypertension stage 1  Systolic pressure: 130-139.  Diastolic pressure: 80-89. Hypertension stage 2  Systolic pressure: 140 or above.  Diastolic pressure: 90 or above. You can have prehypertension or hypertension even if only the systolic or only the diastolic number in your reading is higher than normal. Follow these instructions at home:  Check your blood pressure as  often as recommended by your health care provider.  Take your monitor to the next appointment with your health care provider to make sure: ? That you are using it correctly. ? That it provides accurate readings.  Be sure you understand what your goal blood pressure numbers are.  Tell your health care provider if you are having any side effects from blood pressure medicine. Contact a health care provider if:  Your blood pressure is consistently high. Get help right away if:  Your systolic blood pressure is higher than 180.  Your diastolic blood pressure is higher than 110. This information is not intended to replace advice given to you by your health care provider. Make sure you discuss any questions you have with your health care provider. Document Released: 09/02/2015 Document Revised: 11/15/2015 Document Reviewed: 09/02/2015 Elsevier Interactive Patient Education  2018 Elsevier Inc.   

## 2019-06-10 ENCOUNTER — Ambulatory Visit: Payer: Medicare Other | Admitting: Internal Medicine

## 2019-06-10 ENCOUNTER — Other Ambulatory Visit: Payer: Self-pay

## 2019-06-10 ENCOUNTER — Encounter: Payer: Self-pay | Admitting: Internal Medicine

## 2019-06-10 VITALS — BP 142/86 | HR 75 | Ht 67.0 in | Wt 234.0 lb

## 2019-06-10 DIAGNOSIS — E782 Mixed hyperlipidemia: Secondary | ICD-10-CM | POA: Diagnosis not present

## 2019-06-10 DIAGNOSIS — E669 Obesity, unspecified: Secondary | ICD-10-CM

## 2019-06-10 DIAGNOSIS — I10 Essential (primary) hypertension: Secondary | ICD-10-CM | POA: Diagnosis not present

## 2019-06-10 NOTE — Progress Notes (Signed)
OFFICE CONSULT NOTE  Chief Complaint:  Manage hypertension/dyslipidemia  Primary Care Physician: Mellody Dance, DO  HPI:  Doris Harris is a 74 y.o. female who is being seen today for the evaluation of dyslipidemia/hypertension at the request of Mellody Dance, DO.  This is a pleasant 74 year old female with recent concern for hypertensive urgency.  She is on 3 different blood pressure medications and her PCP increased her verapamil more recently in the office from 120 to 180 mg daily.  Blood pressure today is 142/86, which is improved.  Despite this, she says she has not started the newly increased dose of verapamil.  I think that I would advise her to make that change.  Additionally she is noted to have an elevated cholesterol.  Total is 233, triglycerides 78, HDL 86 and LDL 134.  She has no known coronary disease although is heart disease in her mother.  She had tried atorvastatin which caused some joint pain.  As she has a high HDL cholesterol, she may very well have a cardioprotective benefit from that.  I advised a coronary calcium score.  PMHx:  Past Medical History:  Diagnosis Date  . Back pain   . Hypertension   . IFG (impaired fasting glucose)    x1  . Joint pain   . Obesity   . Prediabetes   . Vitamin D deficiency     Past Surgical History:  Procedure Laterality Date  . MM BREAST STEREO BX*L*R/S    . TOTAL ABDOMINAL HYSTERECTOMY  1997   fibroid  . uterine polyps    . WRIST FRACTURE SURGERY  2000    FAMHx:  Family History  Problem Relation Age of Onset  . Hypertension Mother   . Hyperlipidemia Mother   . Heart disease Mother   . Hypertension Father   . Hypertension Brother   . Diabetes Maternal Grandmother   . Coronary artery disease Other   . Hypertension Other   . Asthma Other   . Hypertension Brother   . Hypertension Sister   . Healthy Son   . Hypertension Sister   . Healthy Sister   . Hypertension Brother   . Healthy Brother   . Healthy  Brother   . Healthy Brother   . Heart attack Brother     SOCHx:   reports that she has never smoked. She has never used smokeless tobacco. She reports that she does not drink alcohol or use drugs.  ALLERGIES:  Allergies  Allergen Reactions  . Amlodipine Nausea And Vomiting  . Hctz [Hydrochlorothiazide] Other (See Comments)    Freq urination- 10+ times/ day  . Ibuprofen     REACTION: unspecified  . Lipitor [Atorvastatin Calcium] Other (See Comments)    Joint pain  . Lisinopril Cough  . Benazepril Rash  . Valsartan-Hydrochlorothiazide Rash    ROS: Pertinent items noted in HPI and remainder of comprehensive ROS otherwise negative.  HOME MEDS: Current Outpatient Medications on File Prior to Visit  Medication Sig Dispense Refill  . Cholecalciferol (VITAMIN D3 PO) Take by mouth.    . metoprolol succinate (TOPROL-XL) 100 MG 24 hr tablet Take 1 tablet (100 mg total) by mouth daily. RF by cards in future 90 tablet 0  . olmesartan (BENICAR) 40 MG tablet Take 1 tablet (40 mg total) by mouth daily. RF by cards in future 90 tablet 0  . verapamil (CALAN-SR) 180 MG CR tablet Take 1 tablet (180 mg total) by mouth at bedtime. 90 tablet 0  No current facility-administered medications on file prior to visit.    LABS/IMAGING: No results found for this or any previous visit (from the past 48 hour(s)). No results found.  LIPID PANEL:    Component Value Date/Time   CHOL 233 (H) 06/01/2019 1239   TRIG 78 06/01/2019 1239   TRIG 53 01/23/2006 1121   HDL 86 06/01/2019 1239   CHOLHDL 2.8 01/12/2019 0913   CHOLHDL 2.9 03/13/2016 1023   VLDL 16 03/13/2016 1023   LDLCALC 134 (H) 06/01/2019 1239   LDLDIRECT 120.2 09/05/2012 1154    WEIGHTS: Wt Readings from Last 3 Encounters:  06/10/19 234 lb (106.1 kg)  06/08/19 235 lb (106.6 kg)  06/01/19 234 lb (106.1 kg)    VITALS: BP (!) 142/86   Pulse 75   Ht 5\' 7"  (1.702 m)   Wt 234 lb (106.1 kg)   SpO2 96%   BMI 36.65 kg/m    EXAM: General appearance: alert, no distress and moderately obese Neck: no carotid bruit, no JVD and thyroid not enlarged, symmetric, no tenderness/mass/nodules Lungs: clear to auscultation bilaterally Heart: regular rate and rhythm, S1, S2 normal, no murmur, click, rub or gallop Abdomen: soft, non-tender; bowel sounds normal; no masses,  no organomegaly Extremities: extremities normal, atraumatic, no cyanosis or edema Pulses: 2+ and symmetric Skin: Skin color, texture, turgor normal. No rashes or lesions Neurologic: Grossly normal Psych: Pleasant  EKG: Sinus rhythm, possible left atrial enlargement- personally reviewed  ASSESSMENT: 1. Uncontrolled hypertension 2. Mixed dyslipidemia 3. Moderate obesity  PLAN: 1.   Ms. Wittner has uncontrolled hypertension and I agree with increasing her verapamil.  She should monitor blood pressure at home and follow-up with her PCP.  I think at this point she is close to getting optimal control of her medicines.  With regards to dyslipidemia, she did not tolerate atorvastatin.  I advised a calcium score to see if she actually needs to be treated as she has a high HDL cholesterol.  She declined the calcium score.  If she were to be treated, then would consider trying to lower LDL less than 100.  She is going to work aggressively with diet and weight loss and was referred to the weight management center.  Ultimately this may be all she needs.  One could consider low-dose rosuvastatin 5 mg daily to see if that is better tolerated.  For now she does not want to do any medical therapy.  Thanks for the kind referral.  Follow-up with Jimmey Ralph as needed.  [name], MD, [facility], FACP  [place]  [facility]  Medical Director of the [facility]  [facility] Diplomate of the American Board of Clinical Lipidology Attending Cardiologist  Direct Dial: [phone]  Fax: [phone]  Website:   [email or web address] Sarha Bartelt 06/10/2019, 4:42 PM

## 2019-06-10 NOTE — Patient Instructions (Signed)
Medication Instructions:  Dr. Rennis Golden recommends that you start verapamil as directed  *If you need a refill on your cardiac medications before your next appointment, please call your pharmacy*  Testing/Procedures: Dr. Rennis Golden suggested a coronary calcium score - which is $150 out of pocket    Follow-Up: At [facility], you and your health needs are our priority.  As part of our continuing mission to provide you with exceptional heart care, we have created designated Provider Care Teams.  These Care Teams include your primary Cardiologist (physician) and Advanced Practice Providers (APPs -  Physician Assistants and Nurse Practitioners) who all work together to provide you with the care you need, when you need it.  We recommend signing up for the patient portal called "MyChart".  Sign up information is provided on this After Visit Summary.  MyChart is used to connect with patients for Virtual Visits (Telemedicine).  Patients are able to view lab/test results, encounter notes, upcoming appointments, etc.  Non-urgent messages can be sent to your provider as well.   To learn more about what you can do with MyChart, go to [email or web address].    Your next appointment:  AS NEEDED with Dr. Rennis Golden

## 2019-06-15 ENCOUNTER — Other Ambulatory Visit: Payer: Self-pay

## 2019-06-15 ENCOUNTER — Ambulatory Visit (INDEPENDENT_AMBULATORY_CARE_PROVIDER_SITE_OTHER): Payer: Medicare Other | Admitting: Family Medicine

## 2019-06-15 ENCOUNTER — Encounter (INDEPENDENT_AMBULATORY_CARE_PROVIDER_SITE_OTHER): Payer: Self-pay | Admitting: Family Medicine

## 2019-06-15 ENCOUNTER — Other Ambulatory Visit (INDEPENDENT_AMBULATORY_CARE_PROVIDER_SITE_OTHER): Payer: Medicare Other

## 2019-06-15 VITALS — BP 170/81 | HR 73 | Temp 98.1°F | Ht 68.0 in | Wt 232.0 lb

## 2019-06-15 DIAGNOSIS — E559 Vitamin D deficiency, unspecified: Secondary | ICD-10-CM

## 2019-06-15 DIAGNOSIS — R7303 Prediabetes: Secondary | ICD-10-CM

## 2019-06-15 DIAGNOSIS — E7849 Other hyperlipidemia: Secondary | ICD-10-CM

## 2019-06-15 DIAGNOSIS — I10 Essential (primary) hypertension: Secondary | ICD-10-CM

## 2019-06-15 DIAGNOSIS — Z6835 Body mass index (BMI) 35.0-35.9, adult: Secondary | ICD-10-CM | POA: Diagnosis not present

## 2019-06-16 MED ORDER — VITAMIN D (ERGOCALCIFEROL) 1.25 MG (50000 UNIT) PO CAPS: 50000.0000 [IU] | ORAL_CAPSULE | ORAL | 0 refills | Status: DC

## 2019-06-16 NOTE — Progress Notes (Signed)
Chief Complaint:   OBESITY Doris Harris is here to discuss her progress with her obesity treatment plan along with follow-up of her obesity related diagnoses. Doris Harris is on the Category 2 Plan and states she is following her eating plan approximately 100% of the time. Doris Harris states she is doing aerobics 30 minutes 7 times per week.  Today's visit was #: 2 Starting weight: 234 lbs Starting date: 06/01/2019 Today's weight: 232 lbs Today's date: 06/15/2019 Total lbs lost to date: 2 Total lbs lost since last in-office visit: 2  Interim History: Doris Harris is learning to adapt to the quantity of food; and she sometimes, doesn't eat all of it. She is doing snack packs, tuna pouches, fruit that consists of apples or grapes, for snacks. Doris Harris has no hunger. Doris Harris is often doing between 4 and 6 ounces at lunch.  Subjective:   Prediabetes Doris Harris has a diagnosis of prediabetes and her Hgb A1c is 5.6 (previously 5.7). Her last insulin level was 8.7 (06/01/19). Doris Harris has occasional cravings, but not significantly. She was informed this puts her at greater risk of developing diabetes. She continues to work on diet and exercise to decrease her risk of diabetes. Labs were discussed with patient today.  Lab Results  Component Value Date   HGBA1C 5.6 06/01/2019   Lab Results  Component Value Date   INSULIN 8.7 06/01/2019   Vitamin D deficiency  Doris Harris's Vitamin D level was 36.7 on 06/01/2019. She is on OTC vit D 5,000 IU daily. She admits fatigue. Labs were discussed with patient today.  Essential hypertension Doris Harris has worsening hypertension. Her blood pressure is significantly elevated today at 164/62. Patient rushed to get here.  BP Readings from Last 3 Encounters:  06/15/19 (!) 170/81  06/10/19 (!) 142/86  06/08/19 (!) 171/86   Lab Results  Component Value Date   CREATININE 0.83 06/01/2019   CREATININE 0.99 02/23/2019   CREATININE 0.87 02/11/2018   Other hyperlipidemia Doris Harris just saw Dr.  Debara Pickett (cardiology). He suggested low dose statin. Labs were discussed with patient today.  Lab Results  Component Value Date   CHOL 233 (H) 06/01/2019   HDL 86 06/01/2019   LDLCALC 134 (H) 06/01/2019   LDLDIRECT 120.2 09/05/2012   TRIG 78 06/01/2019   CHOLHDL 2.8 01/12/2019   Lab Results  Component Value Date   ALT 22 06/01/2019   AST 19 06/01/2019   ALKPHOS 128 (H) 06/01/2019   BILITOT 0.7 06/01/2019   Assessment/Plan:   Prediabetes Amazing will continue to work on weight loss, exercise, and decreasing simple carbohydrates to help decrease the risk of diabetes. We will follow up labs in 3 months, and if cravings increase, we will discuss the initiation of metformin.  Vitamin D deficiency  Low Vitamin D level contributes to fatigue and are associated with obesity, breast, and colon cancer. Doris Harris agrees to start prescription Vitamin D @50 ,000 IU every week #4 with no refills and she will follow-up for routine testing of Vitamin D, at least 2-3 times per year to avoid over-replacement.  Essential hypertension Doris Harris is working on healthy weight loss and exercise to improve blood pressure control. We will watch for signs of hypotension as she continues her lifestyle modifications. Doris Harris will follow up with Dr. Raliegh Scarlet, and at next appointment.  Other hyperlipidemia Doris Harris is to continue the Category 2 plan and we will retest fasting lipid panel in 3 months, and discuss statin at that time.   Class 2 severe obesity with serious comorbidity  and body mass index (BMI) of 35.0 to 35.9 in adult, unspecified obesity type (HCC) Doris Harris is currently in the action stage of change. As such, her goal is to continue with weight loss efforts. She has agreed to the Category 2 Plan and keeping a food journal and adhering to recommended goals of 400 to 500 calories and 35+ grams of protein at supper daily.   Exercise goals: No exercise has been prescribed at this time.  Behavioral modification strategies:  increasing lean protein intake, increasing vegetables, meal planning and cooking strategies, keeping healthy foods in the home and planning for success.  Doris Harris has agreed to follow-up with our clinic in 2 weeks. She was informed of the importance of frequent follow-up visits to maximize her success with intensive lifestyle modifications for her multiple health conditions.   Objective:   Blood pressure (!) 170/81, pulse 73, temperature 98.1 F (36.7 C), temperature source Oral, height 5\' 8"  (1.727 m), weight 232 lb (105.2 kg), SpO2 99 %. Body mass index is 35.28 kg/m.  General: Cooperative, alert, well developed, in no acute distress. HEENT: Conjunctivae and lids unremarkable. Cardiovascular: Regular rhythm.  Lungs: Normal work of breathing. Neurologic: No focal deficits.   Lab Results  Component Value Date   CREATININE 0.83 06/01/2019   BUN 12 06/01/2019   NA 139 06/01/2019   K 4.2 06/01/2019   CL 102 06/01/2019   CO2 23 06/01/2019   Lab Results  Component Value Date   ALT 22 06/01/2019   AST 19 06/01/2019   ALKPHOS 128 (H) 06/01/2019   BILITOT 0.7 06/01/2019   Lab Results  Component Value Date   HGBA1C 5.6 06/01/2019   HGBA1C 5.7 (H) 01/12/2019   HGBA1C 5.7 (H) 02/11/2018   HGBA1C 5.3 10/08/2017   HGBA1C 5.7 (H) 02/18/2017   Lab Results  Component Value Date   INSULIN 8.7 06/01/2019   Lab Results  Component Value Date   TSH 1.460 06/01/2019   Lab Results  Component Value Date   CHOL 233 (H) 06/01/2019   HDL 86 06/01/2019   LDLCALC 134 (H) 06/01/2019   LDLDIRECT 120.2 09/05/2012   TRIG 78 06/01/2019   CHOLHDL 2.8 01/12/2019   Lab Results  Component Value Date   WBC 6.1 06/01/2019   HGB 14.3 06/01/2019   HCT 42.4 06/01/2019   MCV 92 06/01/2019   PLT 186 06/01/2019   No results found for: IRON, TIBC, FERRITIN  Obesity Behavioral Intervention Documentation for Insurance:   Approximately 15 minutes were spent on the discussion below.  ASK: We  discussed the diagnosis of obesity with Doris Harris today and Doris Harris agreed to give 06/03/2019 permission to discuss obesity behavioral modification therapy today.  ASSESS: Camaria has the diagnosis of obesity and her BMI today is 35.28. Ginger is in the action stage of change.   ADVISE: Marlean was educated on the multiple health risks of obesity as well as the benefit of weight loss to improve her health. She was advised of the need for long term treatment and the importance of lifestyle modifications to improve her current health and to decrease her risk of future health problems.  AGREE: Multiple dietary modification options and treatment options were discussed and Alyce agreed to follow the recommendations documented in the above note.  ARRANGE: Brynlei was educated on the importance of frequent visits to treat obesity as outlined per CMS and USPSTF guidelines and agreed to schedule her next follow up appointment today.  Attestation Statements:   Reviewed by Manson Allan on  day of visit: allergies, medications, problem list, medical history, surgical history, family history, social history, and previous encounter notes.  I, Doreene Nest, am acting as transcriptionist for Coralie Common, MD.  I have reviewed the above documentation for accuracy and completeness, and I agree with the above. - Ilene Qua, MD

## 2019-06-29 ENCOUNTER — Other Ambulatory Visit: Payer: Self-pay

## 2019-06-29 ENCOUNTER — Encounter (INDEPENDENT_AMBULATORY_CARE_PROVIDER_SITE_OTHER): Payer: Self-pay | Admitting: Family Medicine

## 2019-06-29 ENCOUNTER — Ambulatory Visit (INDEPENDENT_AMBULATORY_CARE_PROVIDER_SITE_OTHER): Payer: Medicare Other | Admitting: Family Medicine

## 2019-06-29 VITALS — BP 166/76 | HR 70 | Temp 98.2°F | Ht 68.0 in | Wt 229.0 lb

## 2019-06-29 DIAGNOSIS — E669 Obesity, unspecified: Secondary | ICD-10-CM

## 2019-06-29 DIAGNOSIS — Z6834 Body mass index (BMI) 34.0-34.9, adult: Secondary | ICD-10-CM

## 2019-06-29 DIAGNOSIS — I10 Essential (primary) hypertension: Secondary | ICD-10-CM

## 2019-06-29 DIAGNOSIS — R7303 Prediabetes: Secondary | ICD-10-CM

## 2019-06-29 NOTE — Progress Notes (Signed)
Chief Complaint:   OBESITY Doris Harris is here to discuss her progress with her obesity treatment plan along with follow-up of her obesity related diagnoses. Doris Harris is on the Category 2 Plan and keeping a food journal and adhering to recommended goals of 400-500 calories and 35+ grams of protein daily and states she is following her eating plan approximately 80% of the time. Doris Harris states she is walking for 40-60 minutes 2 times per week, and on the stationary bike for 15 minutes 3 times per week.  Today's visit was #: 3 Starting weight: 234 lbs Starting date: 06/01/2019 Today's weight: 229 lbs Today's date: 06/29/2019 Total lbs lost to date: 5 Total lbs lost since last in-office visit: 2  Interim History: Doris Harris has been working on sticking to the meal plan. She has been journaling the entire day. Her calories are around 1500 and average protein during week 1 was 68 grams, and week 2 106 grams. She does not have plans to travel or any excursions in the next few weeks.  Subjective:   1. Essential hypertension Doris Harris had not taken verapamil yet, and her blood pressure is elevated today. She denies chest pain, chest pressure, or headaches.  2. Pre-diabetes Doris Harris's last Hgb A1c was 5.6 and insulin of 8.7. She is not on metformin.  Assessment/Plan:   1. Essential hypertension Doris Harris is working on healthy weight loss and exercise to improve blood pressure control. We will watch for signs of hypotension as she continues her lifestyle modifications.  2. Pre-diabetes Doris Harris will continue to work on weight loss, exercise, and decreasing simple carbohydrates to help decrease the risk of diabetes.   3. Class 1 obesity with serious comorbidity and body mass index (BMI) of 34.0 to 34.9 in adult, unspecified obesity type Doris Harris is currently in the action stage of change. As such, her goal is to continue with weight loss efforts. She has agreed to the Category 2 Plan or keeping a food journal and adhering  to recommended goals of 1150-1300 calories and 80+ grams of protein daily.   Exercise goals: As is.  Behavioral modification strategies: increasing lean protein intake, increasing vegetables, meal planning and cooking strategies and keeping healthy foods in the home.  Doris Harris has agreed to follow-up with our clinic in 2 weeks. She was informed of the importance of frequent follow-up visits to maximize her success with intensive lifestyle modifications for her multiple health conditions.   Objective:   Blood pressure (!) 166/76, pulse 70, temperature 98.2 F (36.8 C), temperature source Oral, height 5\' 8"  (1.727 m), weight 229 lb (103.9 kg), SpO2 98 %. Body mass index is 34.82 kg/m.  General: Cooperative, alert, well developed, in no acute distress. HEENT: Conjunctivae and lids unremarkable. Cardiovascular: Regular rhythm.  Lungs: Normal work of breathing. Neurologic: No focal deficits.   Lab Results  Component Value Date   CREATININE 0.83 06/01/2019   BUN 12 06/01/2019   NA 139 06/01/2019   K 4.2 06/01/2019   CL 102 06/01/2019   CO2 23 06/01/2019   Lab Results  Component Value Date   ALT 22 06/01/2019   AST 19 06/01/2019   ALKPHOS 128 (H) 06/01/2019   BILITOT 0.7 06/01/2019   Lab Results  Component Value Date   HGBA1C 5.6 06/01/2019   HGBA1C 5.7 (H) 01/12/2019   HGBA1C 5.7 (H) 02/11/2018   HGBA1C 5.3 10/08/2017   HGBA1C 5.7 (H) 02/18/2017   Lab Results  Component Value Date   INSULIN 8.7 06/01/2019  Lab Results  Component Value Date   TSH 1.460 06/01/2019   Lab Results  Component Value Date   CHOL 233 (H) 06/01/2019   HDL 86 06/01/2019   LDLCALC 134 (H) 06/01/2019   LDLDIRECT 120.2 09/05/2012   TRIG 78 06/01/2019   CHOLHDL 2.8 01/12/2019   Lab Results  Component Value Date   WBC 6.1 06/01/2019   HGB 14.3 06/01/2019   HCT 42.4 06/01/2019   MCV 92 06/01/2019   PLT 186 06/01/2019   No results found for: IRON, TIBC, FERRITIN  Attestation Statements:     Reviewed by clinician on day of visit: allergies, medications, problem list, medical history, surgical history, family history, social history, and previous encounter notes.  Time spent on visit including pre-visit chart review and post-visit care and charting was 12 minutes.    I, Burt Knack, am acting as transcriptionist for Debbra Riding, MD.  I have reviewed the above documentation for accuracy and completeness, and I agree with the above. - Debbra Riding, MD

## 2019-07-13 ENCOUNTER — Encounter (INDEPENDENT_AMBULATORY_CARE_PROVIDER_SITE_OTHER): Payer: Self-pay | Admitting: Physician Assistant

## 2019-07-13 ENCOUNTER — Ambulatory Visit (INDEPENDENT_AMBULATORY_CARE_PROVIDER_SITE_OTHER): Payer: Medicare Other | Admitting: Physician Assistant

## 2019-07-13 ENCOUNTER — Other Ambulatory Visit: Payer: Self-pay

## 2019-07-13 VITALS — BP 155/79 | HR 67 | Temp 97.9°F | Ht 68.0 in | Wt 228.0 lb

## 2019-07-13 DIAGNOSIS — Z6834 Body mass index (BMI) 34.0-34.9, adult: Secondary | ICD-10-CM | POA: Diagnosis not present

## 2019-07-13 DIAGNOSIS — E559 Vitamin D deficiency, unspecified: Secondary | ICD-10-CM

## 2019-07-13 DIAGNOSIS — E7849 Other hyperlipidemia: Secondary | ICD-10-CM

## 2019-07-13 DIAGNOSIS — E669 Obesity, unspecified: Secondary | ICD-10-CM

## 2019-07-13 MED ORDER — VITAMIN D (ERGOCALCIFEROL) 1.25 MG (50000 UNIT) PO CAPS: 50000.0000 [IU] | ORAL_CAPSULE | ORAL | 0 refills | Status: DC

## 2019-07-13 NOTE — Progress Notes (Signed)
Chief Complaint:   OBESITY Shavonn is here to discuss her progress with her obesity treatment plan along with follow-up of her obesity related diagnoses. Magin is on the Category 2 Plan and keeping a food journal and adhering to recommended goals of 1150-1300 calories and 80+ grams of protein daily and states she is following her eating plan approximately 100% of the time. Tajuana states she is walking and doing aerobics for 90 minutes 3 times per week.  Today's visit was #: 4 Starting weight: 234 lbs Starting date: 06/01/2019 Today's weight: 228 lbs Today's date: 07/13/2019 Total lbs lost to date: 6 Total lbs lost since last in-office visit: 1  Interim History: Mayling states that she is reaching her calorie goal daily, but only getting 60-70 grams of protein in daily.  Subjective:   1. Vitamin D deficiency Anniebell is on Vit D, and she denies nausea, vomiting, or muscle weakness.  2. Other hyperlipidemia Kenzington is not on medications. She denies chest pain, and she is exercising regularly.  Assessment/Plan:   1. Vitamin D deficiency Low Vitamin D level contributes to fatigue and are associated with obesity, breast, and colon cancer. We will refill prescription Vitamin D for 1 month. Kathee will follow-up for routine testing of Vitamin D, at least 2-3 times per year to avoid over-replacement.  - Vitamin D, Ergocalciferol, (DRISDOL) 1.25 MG (50000 UNIT) CAPS capsule; Take 1 capsule (50,000 Units total) by mouth every 7 (seven) days.  Dispense: 4 capsule; Refill: 0  2. Other hyperlipidemia Cardiovascular risk and specific lipid/LDL goals reviewed. We discussed several lifestyle modifications today and Sanna will continue to work on diet, exercise and weight loss efforts. Orders and follow up as documented in patient record.   Counseling Intensive lifestyle modifications are the first line treatment for this issue. . Dietary changes: Increase soluble fiber. Decrease simple  carbohydrates. . Exercise changes: Moderate to vigorous-intensity aerobic activity 150 minutes per week if tolerated. . Lipid-lowering medications: see documented in medical record.  3. Class 1 obesity with serious comorbidity and body mass index (BMI) of 34.0 to 34.9 in adult, unspecified obesity type Lashaundra is currently in the action stage of change. As such, her goal is to continue with weight loss efforts. She has agreed to keeping a food journal and adhering to recommended goals of 1150-1300 calories and 80+ grams of protein daily.   Exercise goals: As is.  Behavioral modification strategies: increasing lean protein intake and meal planning and cooking strategies.  Shaquoya has agreed to follow-up with our clinic in 2 weeks. She was informed of the importance of frequent follow-up visits to maximize her success with intensive lifestyle modifications for her multiple health conditions.   Objective:   Blood pressure (!) 155/79, pulse 67, temperature 97.9 F (36.6 C), temperature source Oral, height 5\' 8"  (1.727 m), weight 228 lb (103.4 kg), SpO2 100 %. Body mass index is 34.67 kg/m.  General: Cooperative, alert, well developed, in no acute distress. HEENT: Conjunctivae and lids unremarkable. Cardiovascular: Regular rhythm.  Lungs: Normal work of breathing. Neurologic: No focal deficits.   Lab Results  Component Value Date   CREATININE 0.83 06/01/2019   BUN 12 06/01/2019   NA 139 06/01/2019   K 4.2 06/01/2019   CL 102 06/01/2019   CO2 23 06/01/2019   Lab Results  Component Value Date   ALT 22 06/01/2019   AST 19 06/01/2019   ALKPHOS 128 (H) 06/01/2019   BILITOT 0.7 06/01/2019   Lab Results  Component Value Date   HGBA1C 5.6 06/01/2019   HGBA1C 5.7 (H) 01/12/2019   HGBA1C 5.7 (H) 02/11/2018   HGBA1C 5.3 10/08/2017   HGBA1C 5.7 (H) 02/18/2017   Lab Results  Component Value Date   INSULIN 8.7 06/01/2019   Lab Results  Component Value Date   TSH 1.460 06/01/2019    Lab Results  Component Value Date   CHOL 233 (H) 06/01/2019   HDL 86 06/01/2019   LDLCALC 134 (H) 06/01/2019   LDLDIRECT 120.2 09/05/2012   TRIG 78 06/01/2019   CHOLHDL 2.8 01/12/2019   Lab Results  Component Value Date   WBC 6.1 06/01/2019   HGB 14.3 06/01/2019   HCT 42.4 06/01/2019   MCV 92 06/01/2019   PLT 186 06/01/2019   No results found for: IRON, TIBC, FERRITIN  Obesity Behavioral Intervention Documentation for Insurance:   Approximately 15 minutes were spent on the discussion below.  ASK: We discussed the diagnosis of obesity with Chasiti today and Devyne agreed to give [name] permission to discuss obesity behavioral modification therapy today.  ASSESS: Christol has the diagnosis of obesity and her BMI today is 34.68. Savreen is in the action stage of change.   ADVISE: Anatasia was educated on the multiple health risks of obesity as well as the benefit of weight loss to improve her health. She was advised of the need for long term treatment and the importance of lifestyle modifications to improve her current health and to decrease her risk of future health problems.  AGREE: Multiple dietary modification options and treatment options were discussed and Keriann agreed to follow the recommendations documented in the above note.  ARRANGE: Darbi was educated on the importance of frequent visits to treat obesity as outlined per CMS and USPSTF guidelines and agreed to schedule her next follow up appointment today.  Attestation Statements:   Reviewed by clinician on day of visit: allergies, medications, problem list, medical history, surgical history, family history, social history, and previous encounter notes.   Wilhemena Durie, am acting as transcriptionist for [employer], PA-C.  I have reviewed the above documentation for accuracy and completeness, and I agree with the above. Abby Potash, PA-C

## 2019-07-28 ENCOUNTER — Ambulatory Visit (INDEPENDENT_AMBULATORY_CARE_PROVIDER_SITE_OTHER): Payer: Medicare Other | Admitting: Physician Assistant

## 2019-07-28 ENCOUNTER — Other Ambulatory Visit: Payer: Self-pay

## 2019-07-28 ENCOUNTER — Encounter (INDEPENDENT_AMBULATORY_CARE_PROVIDER_SITE_OTHER): Payer: Self-pay | Admitting: Physician Assistant

## 2019-07-28 VITALS — BP 179/85 | HR 69 | Temp 98.4°F | Ht 68.0 in | Wt 227.0 lb

## 2019-07-28 DIAGNOSIS — I10 Essential (primary) hypertension: Secondary | ICD-10-CM

## 2019-07-28 DIAGNOSIS — E669 Obesity, unspecified: Secondary | ICD-10-CM | POA: Diagnosis not present

## 2019-07-28 DIAGNOSIS — Z6834 Body mass index (BMI) 34.0-34.9, adult: Secondary | ICD-10-CM | POA: Diagnosis not present

## 2019-07-28 NOTE — Progress Notes (Signed)
Chief Complaint:   OBESITY Doris Harris is here to discuss her progress with her obesity treatment plan along with follow-up of her obesity related diagnoses. Doris Harris is keeping a food journal and adhering to recommended goals of 1150-1300 calories and 80 protein and states she is following her eating plan approximately 100% of the time. Doris Harris states she is doing cardio 60 minutes 5 times per week.  Today's visit was #: 5 Starting weight: 234 lbs Starting date: 06/01/2019 Today's weight: 227 lbs Today's date: 07/28/2019 Total lbs lost to date: 7 Total lbs lost since last in-office visit: 1  Interim History: Doris Harris is journaling consistently. She is not always reaching her protein goal.  Subjective:   Essential hypertension. Blood pressure is elevated today. She states she was rushing in the office this morning. Blood pressure at home 120-150/70-80. No headache or chest pain. She states she is taking her medications consistently.  BP Readings from Last 3 Encounters:  07/28/19 (!) 179/85  07/13/19 (!) 155/79  06/29/19 (!) 166/76   Lab Results  Component Value Date   CREATININE 0.83 06/01/2019   CREATININE 0.99 02/23/2019   CREATININE 0.87 02/11/2018   Assessment/Plan:   Essential hypertension. Doris Harris is working on healthy weight loss and exercise to improve blood pressure control. We will watch for signs of hypotension as she continues her lifestyle modifications. She will speak with her PCP regarding her blood pressure log and ensure medications do not need to be adjusted. Recheck blood pressure today 164/90.  Class 1 obesity with serious comorbidity and body mass index (BMI) of 34.0 to 34.9 in adult, unspecified obesity type.  Doris Harris is currently in the action stage of change. As such, her goal is to continue with weight loss efforts. She has agreed to keeping a food journal and adhering to recommended goals of 1150-1300 calories and 80 grams of protein daily.   Exercise  goals: Older adults should follow the adult guidelines. When older adults cannot meet the adult guidelines, they should be as physically active as their abilities and conditions will allow.   Behavioral modification strategies: increasing lean protein intake.  Doris Harris has agreed to follow-up with our clinic in 2-3 weeks. She was informed of the importance of frequent follow-up visits to maximize her success with intensive lifestyle modifications for her multiple health conditions.   Objective:   Blood pressure (!) 179/85, pulse 69, temperature 98.4 F (36.9 C), temperature source Oral, height 5\' 8"  (1.727 m), weight 227 lb (103 kg), SpO2 99 %. Body mass index is 34.52 kg/m.  General: Cooperative, alert, well developed, in no acute distress. HEENT: Conjunctivae and lids unremarkable. Cardiovascular: Regular rhythm.  Lungs: Normal work of breathing. Neurologic: No focal deficits.   Lab Results  Component Value Date   CREATININE 0.83 06/01/2019   BUN 12 06/01/2019   NA 139 06/01/2019   K 4.2 06/01/2019   CL 102 06/01/2019   CO2 23 06/01/2019   Lab Results  Component Value Date   ALT 22 06/01/2019   AST 19 06/01/2019   ALKPHOS 128 (H) 06/01/2019   BILITOT 0.7 06/01/2019   Lab Results  Component Value Date   HGBA1C 5.6 06/01/2019   HGBA1C 5.7 (H) 01/12/2019   HGBA1C 5.7 (H) 02/11/2018   HGBA1C 5.3 10/08/2017   HGBA1C 5.7 (H) 02/18/2017   Lab Results  Component Value Date   INSULIN 8.7 06/01/2019   Lab Results  Component Value Date   TSH 1.460 06/01/2019   Lab  Results  Component Value Date   CHOL 233 (H) 06/01/2019   HDL 86 06/01/2019   LDLCALC 134 (H) 06/01/2019   LDLDIRECT 120.2 09/05/2012   TRIG 78 06/01/2019   CHOLHDL 2.8 01/12/2019   Lab Results  Component Value Date   WBC 6.1 06/01/2019   HGB 14.3 06/01/2019   HCT 42.4 06/01/2019   MCV 92 06/01/2019   PLT 186 06/01/2019   No results found for: IRON, TIBC, FERRITIN  Obesity Behavioral Intervention  Documentation for Insurance:   Approximately 15 minutes were spent on the discussion below.  ASK: We discussed the diagnosis of obesity with Doris Harris today and Doris Harris agreed to give [name] permission to discuss obesity behavioral modification therapy today.  ASSESS: Doris Harris has the diagnosis of obesity and her BMI today is 34.5. Lynnell is in the action stage of change.   ADVISE: Doris Harris was educated on the multiple health risks of obesity as well as the benefit of weight loss to improve her health. She was advised of the need for long term treatment and the importance of lifestyle modifications to improve her current health and to decrease her risk of future health problems.  AGREE: Multiple dietary modification options and treatment options were discussed and Doris Harris agreed to follow the recommendations documented in the above note.  ARRANGE: Doris Harris was educated on the importance of frequent visits to treat obesity as outlined per CMS and USPSTF guidelines and agreed to schedule her next follow up appointment today.  Attestation Statements:   Reviewed by clinician on day of visit: allergies, medications, problem list, medical history, surgical history, family history, social history, and previous encounter notes.  Doris Harris, am acting as transcriptionist for Alois Cliche, PA-C   I have reviewed the above documentation for accuracy and completeness, and I agree with the above. Alois Cliche, PA-C

## 2019-08-06 ENCOUNTER — Other Ambulatory Visit (INDEPENDENT_AMBULATORY_CARE_PROVIDER_SITE_OTHER): Payer: Self-pay | Admitting: Physician Assistant

## 2019-08-06 DIAGNOSIS — E559 Vitamin D deficiency, unspecified: Secondary | ICD-10-CM

## 2019-08-09 ENCOUNTER — Other Ambulatory Visit: Payer: Self-pay | Admitting: Family Medicine

## 2019-08-09 DIAGNOSIS — I10 Essential (primary) hypertension: Secondary | ICD-10-CM

## 2019-08-09 DIAGNOSIS — I160 Hypertensive urgency: Secondary | ICD-10-CM

## 2019-08-17 ENCOUNTER — Other Ambulatory Visit: Payer: Self-pay

## 2019-08-17 ENCOUNTER — Ambulatory Visit (INDEPENDENT_AMBULATORY_CARE_PROVIDER_SITE_OTHER): Payer: Medicare Other | Admitting: Physician Assistant

## 2019-08-17 ENCOUNTER — Encounter (INDEPENDENT_AMBULATORY_CARE_PROVIDER_SITE_OTHER): Payer: Self-pay | Admitting: Physician Assistant

## 2019-08-17 VITALS — BP 174/91 | HR 69 | Temp 98.8°F | Ht 68.0 in | Wt 225.0 lb

## 2019-08-17 DIAGNOSIS — Z6834 Body mass index (BMI) 34.0-34.9, adult: Secondary | ICD-10-CM

## 2019-08-17 DIAGNOSIS — E669 Obesity, unspecified: Secondary | ICD-10-CM

## 2019-08-17 DIAGNOSIS — E559 Vitamin D deficiency, unspecified: Secondary | ICD-10-CM | POA: Diagnosis not present

## 2019-08-17 DIAGNOSIS — I10 Essential (primary) hypertension: Secondary | ICD-10-CM | POA: Diagnosis not present

## 2019-08-17 MED ORDER — VITAMIN D (ERGOCALCIFEROL) 1.25 MG (50000 UNIT) PO CAPS: 50000.0000 [IU] | ORAL_CAPSULE | ORAL | 0 refills | Status: DC

## 2019-08-17 MED ORDER — VERAPAMIL HCL ER 120 MG PO TBCR: 120.0000 mg | EXTENDED_RELEASE_TABLET | Freq: Two times a day (BID) | ORAL | 0 refills | Status: DC

## 2019-08-17 NOTE — Progress Notes (Signed)
Chief Complaint:   OBESITY Doris Harris is here to discuss her progress with her obesity treatment plan along with follow-up of her obesity related diagnoses. Doris Harris is on the Category 2 Plan and journaling 1150-1300 calories + 80 grams of protein and states she is following her eating plan approximately 100% of the time. Doris Harris states she is doing cardio 30-40 minutes 5 times per week.  Today's visit was #: 6 Starting weight: 234 lbs Starting date: 06/01/2019 Today's weight: 225 lbs Today's date: 08/17/2019 Total lbs lost to date: 9 Total lbs lost since last in-office visit: 2  Interim History: Doris Harris reports that she is eating more tuna and sardines to get her protein in. She is getting over 90 grams of protein daily and is staying within her calories.  Subjective:   Essential hypertension. Jaleah reports checking her blood pressure at home and reports it is averaging 150-170/80-90. No chest pain or headache.  BP Readings from Last 3 Encounters:  08/17/19 (!) 174/91  07/28/19 (!) 179/85  07/13/19 (!) 155/79   Lab Results  Component Value Date   CREATININE 0.83 06/01/2019   CREATININE 0.99 02/23/2019   CREATININE 0.87 02/11/2018   Vitamin D deficiency. Doris Harris is on prescription Vitamin D. No nausea, vomiting, or muscle weakness. Last Vitamin D 36.7 on 06/01/2019.  Assessment/Plan:   Essential hypertension. Doris Harris is working on healthy weight loss and exercise to improve blood pressure control. We will watch for signs of hypotension as she continues her lifestyle modifications. She will change dose of verapamil (CALAN-SR) to 120 MG CR tablet BID #60 with 0 refills.  Vitamin D deficiency. Low Vitamin D level contributes to fatigue and are associated with obesity, breast, and colon cancer. She was given a refill on her Vitamin D, Ergocalciferol, (DRISDOL) 1.25 MG (50000 UNIT) CAPS capsule every week #4 with 0 refills and will follow-up for routine testing of Vitamin D, at least  2-3 times per year to avoid over-replacement.    Class 1 obesity with serious comorbidity and body mass index (BMI) of 34.0 to 34.9 in adult, unspecified obesity type.  Doris Harris is currently in the action stage of change. As such, her goal is to continue with weight loss efforts. She has agreed to keeping a food journal and adhering to recommended goals of 1150-1300 calories and 80 grams of protein daily.   Exercise goals: Doris Harris should follow the adult guidelines. When Doris Harris cannot meet the adult guidelines, they should be as physically active as their abilities and conditions will allow.   Behavioral modification strategies: meal planning and cooking strategies and emotional eating strategies.  Doris Harris has agreed to follow-up with our clinic in 2 weeks. She was informed of the importance of frequent follow-up visits to maximize her success with intensive lifestyle modifications for her multiple health conditions.   Objective:   Blood pressure (!) 174/91, pulse 69, temperature 98.8 F (37.1 C), temperature source Oral, height 5\' 8"  (1.727 m), weight 225 lb (102.1 kg), SpO2 98 %. Body mass index is 34.21 kg/m.  General: Cooperative, alert, well developed, in no acute distress. HEENT: Conjunctivae and lids unremarkable. Cardiovascular: Regular rhythm.  Lungs: Normal work of breathing. Neurologic: No focal deficits.   Lab Results  Component Value Date   CREATININE 0.83 06/01/2019   BUN 12 06/01/2019   NA 139 06/01/2019   K 4.2 06/01/2019   CL 102 06/01/2019   CO2 23 06/01/2019   Lab Results  Component Value Date  ALT 22 06/01/2019   AST 19 06/01/2019   ALKPHOS 128 (H) 06/01/2019   BILITOT 0.7 06/01/2019   Lab Results  Component Value Date   HGBA1C 5.6 06/01/2019   HGBA1C 5.7 (H) 01/12/2019   HGBA1C 5.7 (H) 02/11/2018   HGBA1C 5.3 10/08/2017   HGBA1C 5.7 (H) 02/18/2017   Lab Results  Component Value Date   INSULIN 8.7 06/01/2019   Lab Results  Component  Value Date   TSH 1.460 06/01/2019   Lab Results  Component Value Date   CHOL 233 (H) 06/01/2019   HDL 86 06/01/2019   LDLCALC 134 (H) 06/01/2019   LDLDIRECT 120.2 09/05/2012   TRIG 78 06/01/2019   CHOLHDL 2.8 01/12/2019   Lab Results  Component Value Date   WBC 6.1 06/01/2019   HGB 14.3 06/01/2019   HCT 42.4 06/01/2019   MCV 92 06/01/2019   PLT 186 06/01/2019   No results found for: IRON, TIBC, FERRITIN  Obesity Behavioral Intervention Documentation for Insurance:   Approximately 15 minutes were spent on the discussion below.  ASK: We discussed the diagnosis of obesity with Doris Harris today and Doris Harris agreed to give [name] permission to discuss obesity behavioral modification therapy today.  ASSESS: Doris Harris has the diagnosis of obesity and her BMI today is 34.2. Doris Harris is in the action stage of change.   ADVISE: Doris Harris was educated on the multiple health risks of obesity as well as the benefit of weight loss to improve her health. She was advised of the need for long term treatment and the importance of lifestyle modifications to improve her current health and to decrease her risk of future health problems.  AGREE: Multiple dietary modification options and treatment options were discussed and Doris Harris agreed to follow the recommendations documented in the above note.  ARRANGE: Doris Harris was educated on the importance of frequent visits to treat obesity as outlined per CMS and USPSTF guidelines and agreed to schedule her next follow up appointment today.  Attestation Statements:   Reviewed by clinician on day of visit: allergies, medications, problem list, medical history, surgical history, family history, social history, and previous encounter notes.  Doris Harris, am acting as transcriptionist for Abby Potash, PA-C   I have reviewed the above documentation for accuracy and completeness, and I agree with the above. Abby Potash, PA-C

## 2019-08-25 ENCOUNTER — Other Ambulatory Visit: Payer: Self-pay

## 2019-08-25 ENCOUNTER — Encounter: Payer: Self-pay | Admitting: Physician Assistant

## 2019-08-25 ENCOUNTER — Ambulatory Visit (INDEPENDENT_AMBULATORY_CARE_PROVIDER_SITE_OTHER): Payer: Medicare Other | Admitting: Physician Assistant

## 2019-08-25 VITALS — BP 149/73 | HR 74 | Temp 98.0°F | Ht 68.0 in | Wt 229.3 lb

## 2019-08-25 DIAGNOSIS — I10 Essential (primary) hypertension: Secondary | ICD-10-CM

## 2019-08-25 NOTE — Patient Instructions (Signed)
DASH Eating Plan DASH stands for "Dietary Approaches to Stop Hypertension." The DASH eating plan is a healthy eating plan that has been shown to reduce high blood pressure (hypertension). It may also reduce your risk for type 2 diabetes, heart disease, and stroke. The DASH eating plan may also help with weight loss. What are tips for following this plan?  General guidelines  Avoid eating more than 2,300 mg (milligrams) of salt (sodium) a day. If you have hypertension, you may need to reduce your sodium intake to 1,500 mg a day.  Limit alcohol intake to no more than 1 drink a day for nonpregnant women and 2 drinks a day for men. One drink equals 12 oz of beer, 5 oz of wine, or 1 oz of hard liquor.  Work with your health care provider to maintain a healthy body weight or to lose weight. Ask what an ideal weight is for you.  Get at least 30 minutes of exercise that causes your heart to beat faster (aerobic exercise) most days of the week. Activities may include walking, swimming, or biking.  Work with your health care provider or diet and nutrition specialist (dietitian) to adjust your eating plan to your individual calorie needs. Reading food labels   Check food labels for the amount of sodium per serving. Choose foods with less than 5 percent of the Daily Value of sodium. Generally, foods with less than 300 mg of sodium per serving fit into this eating plan.  To find whole grains, look for the word "whole" as the first word in the ingredient list. Shopping  Buy products labeled as "low-sodium" or "no salt added."  Buy fresh foods. Avoid canned foods and premade or frozen meals. Cooking  Avoid adding salt when cooking. Use salt-free seasonings or herbs instead of table salt or sea salt. Check with your health care provider or pharmacist before using salt substitutes.  Do not fry foods. Cook foods using healthy methods such as baking, boiling, grilling, and broiling instead.  Cook with  heart-healthy oils, such as olive, canola, soybean, or sunflower oil. Meal planning  Eat a balanced diet that includes: ? 5 or more servings of fruits and vegetables each day. At each meal, try to fill half of your plate with fruits and vegetables. ? Up to 6-8 servings of whole grains each day. ? Less than 6 oz of lean meat, poultry, or fish each day. A 3-oz serving of meat is about the same size as a deck of cards. One egg equals 1 oz. ? 2 servings of low-fat dairy each day. ? A serving of nuts, seeds, or beans 5 times each week. ? Heart-healthy fats. Healthy fats called Omega-3 fatty acids are found in foods such as flaxseeds and coldwater fish, like sardines, salmon, and mackerel.  Limit how much you eat of the following: ? Canned or prepackaged foods. ? Food that is high in trans fat, such as fried foods. ? Food that is high in saturated fat, such as fatty meat. ? Sweets, desserts, sugary drinks, and other foods with added sugar. ? Full-fat dairy products.  Do not salt foods before eating.  Try to eat at least 2 vegetarian meals each week.  Eat more home-cooked food and less restaurant, buffet, and fast food.  When eating at a restaurant, ask that your food be prepared with less salt or no salt, if possible. What foods are recommended? The items listed may not be a complete list. Talk with your dietitian about   what dietary choices are best for you. Grains Whole-grain or whole-wheat bread. Whole-grain or whole-wheat pasta. Brown rice. Oatmeal. Quinoa. Bulgur. Whole-grain and low-sodium cereals. Pita bread. Low-fat, low-sodium crackers. Whole-wheat flour tortillas. Vegetables Fresh or frozen vegetables (raw, steamed, roasted, or grilled). Low-sodium or reduced-sodium tomato and vegetable juice. Low-sodium or reduced-sodium tomato sauce and tomato paste. Low-sodium or reduced-sodium canned vegetables. Fruits All fresh, dried, or frozen fruit. Canned fruit in natural juice (without  added sugar). Meat and other protein foods Skinless chicken or turkey. Ground chicken or turkey. Pork with fat trimmed off. Fish and seafood. Egg whites. Dried beans, peas, or lentils. Unsalted nuts, nut butters, and seeds. Unsalted canned beans. Lean cuts of beef with fat trimmed off. Low-sodium, lean deli meat. Dairy Low-fat (1%) or fat-free (skim) milk. Fat-free, low-fat, or reduced-fat cheeses. Nonfat, low-sodium ricotta or cottage cheese. Low-fat or nonfat yogurt. Low-fat, low-sodium cheese. Fats and oils Soft margarine without trans fats. Vegetable oil. Low-fat, reduced-fat, or light mayonnaise and salad dressings (reduced-sodium). Canola, safflower, olive, soybean, and sunflower oils. Avocado. Seasoning and other foods Herbs. Spices. Seasoning mixes without salt. Unsalted popcorn and pretzels. Fat-free sweets. What foods are not recommended? The items listed may not be a complete list. Talk with your dietitian about what dietary choices are best for you. Grains Baked goods made with fat, such as croissants, muffins, or some breads. Dry pasta or rice meal packs. Vegetables Creamed or fried vegetables. Vegetables in a cheese sauce. Regular canned vegetables (not low-sodium or reduced-sodium). Regular canned tomato sauce and paste (not low-sodium or reduced-sodium). Regular tomato and vegetable juice (not low-sodium or reduced-sodium). Pickles. Olives. Fruits Canned fruit in a light or heavy syrup. Fried fruit. Fruit in cream or butter sauce. Meat and other protein foods Fatty cuts of meat. Ribs. Fried meat. Bacon. Sausage. Bologna and other processed lunch meats. Salami. Fatback. Hotdogs. Bratwurst. Salted nuts and seeds. Canned beans with added salt. Canned or smoked fish. Whole eggs or egg yolks. Chicken or turkey with skin. Dairy Whole or 2% milk, cream, and half-and-half. Whole or full-fat cream cheese. Whole-fat or sweetened yogurt. Full-fat cheese. Nondairy creamers. Whipped toppings.  Processed cheese and cheese spreads. Fats and oils Butter. Stick margarine. Lard. Shortening. Ghee. Bacon fat. Tropical oils, such as coconut, palm kernel, or palm oil. Seasoning and other foods Salted popcorn and pretzels. Onion salt, garlic salt, seasoned salt, table salt, and sea salt. Worcestershire sauce. Tartar sauce. Barbecue sauce. Teriyaki sauce. Soy sauce, including reduced-sodium. Steak sauce. Canned and packaged gravies. Fish sauce. Oyster sauce. Cocktail sauce. Horseradish that you find on the shelf. Ketchup. Mustard. Meat flavorings and tenderizers. Bouillon cubes. Hot sauce and Tabasco sauce. Premade or packaged marinades. Premade or packaged taco seasonings. Relishes. Regular salad dressings. Where to find more information:  National Heart, Lung, and Blood Institute: www.nhlbi.nih.gov  American Heart Association: www.heart.org Summary  The DASH eating plan is a healthy eating plan that has been shown to reduce high blood pressure (hypertension). It may also reduce your risk for type 2 diabetes, heart disease, and stroke.  With the DASH eating plan, you should limit salt (sodium) intake to 2,300 mg a day. If you have hypertension, you may need to reduce your sodium intake to 1,500 mg a day.  When on the DASH eating plan, aim to eat more fresh fruits and vegetables, whole grains, lean proteins, low-fat dairy, and heart-healthy fats.  Work with your health care provider or diet and nutrition specialist (dietitian) to adjust your eating plan to your   individual calorie needs. This information is not intended to replace advice given to you by your health care provider. Make sure you discuss any questions you have with your health care provider. Document Revised: 03/08/2017 Document Reviewed: 03/19/2016 Elsevier Patient Education  2020 Elsevier Inc.  

## 2019-08-25 NOTE — Progress Notes (Signed)
Established Patient Office Visit  Subjective:  Patient ID: Doris Harris, female    DOB: [birth year]  Age: 74 y.o. MRN: [id number]  CC:  Chief Complaint  Patient presents with  . Hypertension    HPI Doris Harris presents for follow-up on hypertension. She saw cardiology 06/2019 and Dr. Debara Pickett agreed with increasing Verapamil dose to 180 mg. She is currently seeing [facility] and they recently changed her Verapamil dose to 120 mg twice daily, but has not received her medication yet from her mail pharmacy. Also taking Toprol and Benicar without issues. She continues to check BP at home and brought her log- 08/08/19: 146/85, HR 66; 08/10/19: 152/87, HR 65; 08/14/19: 146/87, HR 59; 08/24/19: 125/85, HR 67. Denies new onset chest pain, palpitations, dizziness or lower extremity swelling.   Past Medical History:  Diagnosis Date  . Back pain   . Hypertension   . IFG (impaired fasting glucose)    x1  . Joint pain   . Obesity   . Prediabetes   . Vitamin D deficiency     Past Surgical History:  Procedure Laterality Date  . MM BREAST STEREO BX*L*R/S    . TOTAL ABDOMINAL HYSTERECTOMY  1997   fibroid  . uterine polyps    . WRIST FRACTURE SURGERY  2000    Family History  Problem Relation Age of Onset  . Hypertension Mother   . Hyperlipidemia Mother   . Heart disease Mother   . Hypertension Father   . Hypertension Brother   . Diabetes Maternal Grandmother   . Coronary artery disease Other   . Hypertension Other   . Asthma Other   . Hypertension Brother   . Hypertension Sister   . Healthy Son   . Hypertension Sister   . Healthy Sister   . Hypertension Brother   . Healthy Brother   . Healthy Brother   . Healthy Brother   . Heart attack Brother     Social History   Socioeconomic History  . Marital status: Single    Spouse name: Not on file  . Number of children: Not on file  . Years of education: Not on file  . Highest education level: Not on file   Occupational History  . Not on file  Tobacco Use  . Smoking status: Never Smoker  . Smokeless tobacco: Never Used  Substance and Sexual Activity  . Alcohol use: No  . Drug use: No  . Sexual activity: Never  Other Topics Concern  . Not on file  Social History Narrative   Retired 12 13    Exercise  Neg tad    Never smoker   Sleep 8 or more hours   hh of 1  Attends 64+ yo days    No pets       Social Determinants of Radio broadcast assistant Strain:   . Difficulty of Paying Living Expenses:   Food Insecurity:   . Worried About Charity fundraiser in the Last Year:   . Arboriculturist in the Last Year:   Transportation Needs:   . Film/video editor (Medical):   [name] Lack of Transportation (Non-Medical):   Physical Activity:   . Days of Exercise per Week:   . Minutes of Exercise per Session:   Stress:   . Feeling of Stress :   Social Connections:   . Frequency of Communication with Friends and Family:   . Frequency of Social Gatherings  with Friends and Family:   . Attends Religious Services:   . Active Member of Clubs or Organizations:   . Attends Banker Meetings:   [name] Marital Status:   Intimate Partner Violence:   . Fear of Current or Ex-Partner:   . Emotionally Abused:   [name] Physically Abused:   . Sexually Abused:     Outpatient Medications Prior to Visit  Medication Sig Dispense Refill  . metoprolol succinate (TOPROL-XL) 100 MG 24 hr tablet Take 1 tablet (100 mg total) by mouth daily. RF by cards in future 90 tablet 0  . olmesartan (BENICAR) 40 MG tablet Take 1 tablet (40 mg total) by mouth daily. RF by cards in future 90 tablet 0  . verapamil (CALAN-SR) 120 MG CR tablet Take 1 tablet (120 mg total) by mouth 2 (two) times daily. 60 tablet 0  . Vitamin D, Ergocalciferol, (DRISDOL) 1.25 MG (50000 UNIT) CAPS capsule Take 1 capsule (50,000 Units total) by mouth every 7 (seven) days. 4 capsule 0   No facility-administered medications prior to visit.     Allergies  Allergen Reactions  . Amlodipine Nausea And Vomiting  . Hctz [Hydrochlorothiazide] Other (See Comments)    Freq urination- 10+ times/ day  . Ibuprofen     REACTION: unspecified  . Lipitor [Atorvastatin Calcium] Other (See Comments)    Joint pain  . Lisinopril Cough  . Benazepril Rash  . Valsartan-Hydrochlorothiazide Rash    ROS Review of Systems  A fourteen system review of systems was performed and found to be positive as per HPI.  Objective:    Physical Exam General:  Well Developed, well nourished, appropriate for stated age.  Neuro:  Alert and oriented,  extra-ocular muscles intact  HEENT:  Normocephalic, atraumatic, neck supple, no carotid bruits appreciated  Skin:  no gross rash, warm, pink. Cardiac:  RRR, S1 S2 Respiratory:  ECTA B/L and A/P, Not using accessory muscles, speaking in full sentences- unlabored. Vascular:  Ext warm, no cyanosis apprec.; cap RF less 2 sec. Psych:  No HI/SI, judgement and insight good, Euthymic mood. Full Affect.   BP (!) 149/73   Pulse 74   Temp 98 F (36.7 C) (Oral)   Ht 5\' 8"  (1.727 m)   Wt 229 lb 4.8 oz (104 kg)   SpO2 96% Comment: on RA  BMI 34.86 kg/m  Wt Readings from Last 3 Encounters:  08/25/19 229 lb 4.8 oz (104 kg)  08/17/19 225 lb (102.1 kg)  07/28/19 227 lb (103 kg)     Health Maintenance Due  Topic Date Due  . COVID-19 Vaccine (1) Never done  . MAMMOGRAM  01/30/2009  . DEXA SCAN  Never done  . COLONOSCOPY  04/09/2012  . PNA vac Low Risk Adult (2 of 2 - PPSV23) 02/22/2016    There are no preventive care reminders to display for this patient.  Lab Results  Component Value Date   TSH 1.460 06/01/2019   Lab Results  Component Value Date   WBC 6.1 06/01/2019   HGB 14.3 06/01/2019   HCT 42.4 06/01/2019   MCV 92 06/01/2019   PLT 186 06/01/2019   Lab Results  Component Value Date   NA 139 06/01/2019   K 4.2 06/01/2019   CO2 23 06/01/2019   GLUCOSE 84 06/01/2019   BUN 12 06/01/2019    CREATININE 0.83 06/01/2019   BILITOT 0.7 06/01/2019   ALKPHOS 128 (H) 06/01/2019   AST 19 06/01/2019   ALT 22 06/01/2019  PROT 7.4 06/01/2019   ALBUMIN 4.3 06/01/2019   CALCIUM 9.6 06/01/2019   GFR 81.03 02/22/2015   Lab Results  Component Value Date   CHOL 233 (H) 06/01/2019   Lab Results  Component Value Date   HDL 86 06/01/2019   Lab Results  Component Value Date   LDLCALC 134 (H) 06/01/2019   Lab Results  Component Value Date   TRIG 78 06/01/2019   Lab Results  Component Value Date   CHOLHDL 2.8 01/12/2019   Lab Results  Component Value Date   HGBA1C 5.6 06/01/2019      Assessment & Plan:   Problem List Items Addressed This Visit      Cardiovascular and Mediastinum   Essential hypertension - Primary (Chronic)     HTN: - BP today is 149/73, above goal. - Continue current medication regimen. - Per med review, verapamil rx receipt confirmed by pharmacy so advised patient to contact her pharmacy and inquire status.  - Continue ambulatory BP and pulse monitoring and keep a log. - Encourage DASH diet. - Continue follow-up with [facility]. Discussed with patient dietary and lifestyle changes will help improve and control her blood pressure.    No orders of the defined types were placed in this encounter.   Follow-up: Return for HTN, elevated LDL, Pre-DM in 4 months.    Mayer Masker, PA-C

## 2019-09-01 ENCOUNTER — Encounter (INDEPENDENT_AMBULATORY_CARE_PROVIDER_SITE_OTHER): Payer: Self-pay

## 2019-09-02 ENCOUNTER — Ambulatory Visit (INDEPENDENT_AMBULATORY_CARE_PROVIDER_SITE_OTHER): Payer: Medicare Other | Admitting: Family Medicine

## 2019-09-02 ENCOUNTER — Encounter (INDEPENDENT_AMBULATORY_CARE_PROVIDER_SITE_OTHER): Payer: Self-pay | Admitting: Family Medicine

## 2019-09-02 ENCOUNTER — Other Ambulatory Visit: Payer: Self-pay

## 2019-09-02 VITALS — BP 144/85 | HR 67 | Temp 98.5°F | Ht 68.0 in | Wt 222.0 lb

## 2019-09-02 DIAGNOSIS — I10 Essential (primary) hypertension: Secondary | ICD-10-CM

## 2019-09-02 DIAGNOSIS — E559 Vitamin D deficiency, unspecified: Secondary | ICD-10-CM | POA: Diagnosis not present

## 2019-09-02 DIAGNOSIS — E669 Obesity, unspecified: Secondary | ICD-10-CM | POA: Diagnosis not present

## 2019-09-02 DIAGNOSIS — Z6833 Body mass index (BMI) 33.0-33.9, adult: Secondary | ICD-10-CM

## 2019-09-02 MED ORDER — VITAMIN D (ERGOCALCIFEROL) 1.25 MG (50000 UNIT) PO CAPS: 50000.0000 [IU] | ORAL_CAPSULE | ORAL | 0 refills | Status: DC

## 2019-09-02 NOTE — Progress Notes (Signed)
Chief Complaint:   OBESITY Doris Harris is here to discuss her progress with her obesity treatment plan along with follow-up of her obesity related diagnoses. Doris Harris is on keeping a food journal and adhering to recommended goals of 1150-1300 calories and 80 grams of protein daily and states she is following her eating plan approximately 95% of the time. Doris Harris states she is walking, lifting weights, and on the elliptical for 40 minutes 5 times per week.  Today's visit was #: 7 Starting weight: 234 lbs Starting date: 06/01/2019 Today's weight: 222 lbs Today's date: 09/02/2019 Total lbs lost to date: 12 Total lbs lost since last in-office visit: 3  Interim History: Doris Harris notes she likes the Category 2 plan, but she does struggle to eat all of the meat. However, she feels she does get the protein in. She has occasional sweet cravings. She does not eat her snack calories.  Subjective:   1. Essential hypertension Doris Harris has not started the increased her dose of verapamil.  Her blood pressure is elevated but has improved today. Her blood pressure run between 140-150/80-90. Cardiovascular ROS: no chest pain or dyspnea on exertion.  BP Readings from Last 3 Encounters:  09/02/19 (!) 144/85  08/25/19 (!) 149/73  08/17/19 (!) 174/91   Lab Results  Component Value Date   CREATININE 0.83 06/01/2019   CREATININE 0.99 02/23/2019   CREATININE 0.87 02/11/2018   2. Vitamin D deficiency Doris Harris's last Vit D was low at 36.7. She is on prescription Vit D.  Assessment/Plan:   1. Essential hypertension Doris Harris is working on healthy weight loss and exercise to improve blood pressure control.  Doris Harris will begin the increased her dose of verapamil prescribed at last OV..  2. Vitamin D deficiency Low Vitamin D level contributes to fatigue and are associated with obesity, breast, and colon cancer. We will refill prescription Vitamin D for 1 month. Doris Harris will follow-up for routine testing of Vitamin D, at least  2-3 times per year to avoid over-replacement.  - Vitamin D, Ergocalciferol, (DRISDOL) 1.25 MG (50000 UNIT) CAPS capsule; Take 1 capsule (50,000 Units total) by mouth every 7 (seven) days.  Dispense: 4 capsule; Refill: 0  3. Class 1 obesity with serious comorbidity and body mass index (BMI) of 33.0 to 33.9 in adult, unspecified obesity type Doris Harris is currently in the action stage of change. As such, her goal is to continue with weight loss efforts. She has agreed to the Category 2 Plan.   Exercise goals: As is.  Behavioral modification strategies: better snacking choices and planning for success.  Doris Harris has agreed to follow-up with our clinic in 2 to 3 weeks with myself or Doris Potash, Doris Harris. She was informed of the importance of frequent follow-up visits to maximize her success with intensive lifestyle modifications for her multiple health conditions.   Objective:   Blood pressure (!) 144/85, pulse 67, temperature 98.5 F (36.9 C), temperature source Oral, height 5\' 8"  (1.727 m), weight 222 lb (100.7 kg), SpO2 100 %. Body mass index is 33.75 kg/m.  General: Cooperative, alert, well developed, in no acute distress. HEENT: Conjunctivae and lids unremarkable. Cardiovascular: Regular rhythm.  Lungs: Normal work of breathing. Neurologic: No focal deficits.   Lab Results  Component Value Date   CREATININE 0.83 06/01/2019   BUN 12 06/01/2019   NA 139 06/01/2019   K 4.2 06/01/2019   CL 102 06/01/2019   CO2 23 06/01/2019   Lab Results  Component Value Date   ALT 22  06/01/2019   AST 19 06/01/2019   ALKPHOS 128 (H) 06/01/2019   BILITOT 0.7 06/01/2019   Lab Results  Component Value Date   HGBA1C 5.6 06/01/2019   HGBA1C 5.7 (H) 01/12/2019   HGBA1C 5.7 (H) 02/11/2018   HGBA1C 5.3 10/08/2017   HGBA1C 5.7 (H) 02/18/2017   Lab Results  Component Value Date   INSULIN 8.7 06/01/2019   Lab Results  Component Value Date   TSH 1.460 06/01/2019   Lab Results  Component Value Date    CHOL 233 (H) 06/01/2019   HDL 86 06/01/2019   LDLCALC 134 (H) 06/01/2019   LDLDIRECT 120.2 09/05/2012   TRIG 78 06/01/2019   CHOLHDL 2.8 01/12/2019   Lab Results  Component Value Date   WBC 6.1 06/01/2019   HGB 14.3 06/01/2019   HCT 42.4 06/01/2019   MCV 92 06/01/2019   PLT 186 06/01/2019   No results found for: IRON, TIBC, FERRITIN  Obesity Behavioral Intervention Documentation for Insurance:   Approximately 15 minutes were spent on the discussion below.  ASK: We discussed the diagnosis of obesity with Doris Harris today and Doris Harris agreed to give [name] permission to discuss obesity behavioral modification therapy today.  ASSESS: Doris Harris has the diagnosis of obesity and her BMI today is 33.76. Doris Harris is in the action stage of change.   ADVISE: Doris Harris was educated on the multiple health risks of obesity as well as the benefit of weight loss to improve her health. She was advised of the need for long term treatment and the importance of lifestyle modifications to improve her current health and to decrease her risk of future health problems.  AGREE: Multiple dietary modification options and treatment options were discussed and Doris Harris agreed to follow the recommendations documented in the above note.  ARRANGE: Doris Harris was educated on the importance of frequent visits to treat obesity as outlined per CMS and USPSTF guidelines and agreed to schedule her next follow up appointment today.  Attestation Statements:   Reviewed by clinician on day of visit: allergies, medications, problem list, medical history, surgical history, family history, social history, and previous encounter notes.   Trude Mcburney, am acting as Energy manager for [place], FNP-C.  I have reviewed the above documentation for accuracy and completeness, and I agree with the above. -  Jesse Sans, FNP

## 2019-09-10 ENCOUNTER — Other Ambulatory Visit: Payer: Self-pay | Admitting: Family Medicine

## 2019-09-10 DIAGNOSIS — I10 Essential (primary) hypertension: Secondary | ICD-10-CM

## 2019-09-22 ENCOUNTER — Other Ambulatory Visit: Payer: Self-pay | Admitting: Physician Assistant

## 2019-09-22 DIAGNOSIS — I10 Essential (primary) hypertension: Secondary | ICD-10-CM

## 2019-09-22 MED ORDER — METOPROLOL SUCCINATE ER 100 MG PO TB24: 100.0000 mg | ORAL_TABLET | Freq: Every day | ORAL | 0 refills | Status: DC

## 2019-09-22 MED ORDER — OLMESARTAN MEDOXOMIL 40 MG PO TABS: 40.0000 mg | ORAL_TABLET | Freq: Every day | ORAL | 0 refills | Status: DC

## 2019-09-22 NOTE — Telephone Encounter (Signed)
Rx sent to requested pharmacy. AS, CMA 

## 2019-09-22 NOTE — Telephone Encounter (Signed)
Patient called states [facility] says no response from [name] for Rx refills ( advised her they were sending refill request to Dr. Sharee Holster who is no longer w/[facility])   --Forwarding request to med asst for refill of  :   olmesartan (BENICAR) 40 MG tablet [[id number]]   Order Details Dose: 40 mg Route: Oral Frequency: Daily  Dispense Quantity: 90 tablet Refills: 0   Note to Pharmacy: Requesting 1 year supply      Sig: Take 1 tablet (40 mg total) by mouth daily. RF by cards in future       &   metoprolol succinate (TOPROL-XL) 100 MG 24 hr tablet [[id number]]   Order Details Dose: 100 mg Route: Oral Frequency: Daily  Dispense Quantity: 90 tablet Refills: 0   Note to Pharmacy: Requesting 1 year supply      Sig: Take 1 tablet (100 mg total) by mouth daily. RF by cards in future        Pt's LOV 08/25/19 w/ Mayer Masker  ---Patient uses :  [facility] - [place], [place] - [address] [place], [address]  [address] [place], [address], [place] [address]  Phone:  [phone] Fax:  [phone]    --Fausto Skillern

## 2019-09-24 ENCOUNTER — Encounter (INDEPENDENT_AMBULATORY_CARE_PROVIDER_SITE_OTHER): Payer: Self-pay | Admitting: Family Medicine

## 2019-09-24 ENCOUNTER — Other Ambulatory Visit: Payer: Self-pay

## 2019-09-24 ENCOUNTER — Ambulatory Visit (INDEPENDENT_AMBULATORY_CARE_PROVIDER_SITE_OTHER): Payer: Medicare Other | Admitting: Family Medicine

## 2019-09-24 VITALS — BP 152/69 | HR 63 | Temp 98.3°F | Ht 68.0 in | Wt 220.0 lb

## 2019-09-24 DIAGNOSIS — Z6833 Body mass index (BMI) 33.0-33.9, adult: Secondary | ICD-10-CM | POA: Diagnosis not present

## 2019-09-24 DIAGNOSIS — E669 Obesity, unspecified: Secondary | ICD-10-CM

## 2019-09-24 DIAGNOSIS — I10 Essential (primary) hypertension: Secondary | ICD-10-CM | POA: Diagnosis not present

## 2019-09-28 ENCOUNTER — Encounter (INDEPENDENT_AMBULATORY_CARE_PROVIDER_SITE_OTHER): Payer: Self-pay | Admitting: Family Medicine

## 2019-09-28 NOTE — Progress Notes (Signed)
Chief Complaint:   [name] is here to discuss her progress with her obesity treatment plan along with follow-up of her obesity related diagnoses. [name] is on the Category 2 Plan and states she is following her eating plan approximately 98-99% of the time. Doris Harris states she is on the elliptical, lifting weights, and walking for 45-60 minutes 5 times per week.  Today's visit was #: 8 Starting weight: 234 lbs Starting date: 06/01/2019 Today's weight: 220 lbs Today's date: 09/24/2019 Total lbs lost to date: 14 Total lbs lost since last in-office visit: 2  Interim History: Doris Harris is interested in trying a meat free meal plan option. She is getting all of the protein in. She is very consistent with exercise.  Subjective:   1. Essential hypertension Doris Harris has started taking the higher dose of verapamil prescribed. [name] Her blood pressure is elevated today. Cardiovascular ROS: no chest pain or dyspnea on exertion.  BP Readings from Last 3 Encounters:  09/24/19 (!) 152/69  09/02/19 (!) 144/85  08/25/19 (!) 149/73   Lab Results  Component Value Date   CREATININE 0.83 06/01/2019   CREATININE 0.99 02/23/2019   CREATININE 0.87 02/11/2018   Assessment/Plan:   1. Essential hypertension Doris Harris is working on healthy weight loss and exercise to improve blood pressure control. We will continue to monitor, and will watch for signs of hypotension as she continues her lifestyle modifications.  2. Class 1 obesity with serious comorbidity and body mass index (BMI) of 33.0 to 33.9 in adult, unspecified obesity type Doris Harris is currently in the action stage of change. As such, her goal is to continue with weight loss efforts. She has agreed to the Category 2 Plan or the [place].   Exercise goals: As is.  Behavioral modification strategies: planning for success.  Doris Harris has agreed to follow-up with our clinic in 3 weeks. She was informed of the importance of frequent follow-up visits to  maximize her success with intensive lifestyle modifications for her multiple health conditions.   Objective:   Blood pressure (!) 152/69, pulse 63, temperature 98.3 F (36.8 C), temperature source Oral, height 5\' 8"  (1.727 m), weight 220 lb (99.8 kg), SpO2 99 %. Body mass index is 33.45 kg/m.  General: Cooperative, alert, well developed, in no acute distress. HEENT: Conjunctivae and lids unremarkable. Cardiovascular: Regular rhythm.  Lungs: Normal work of breathing. Neurologic: No focal deficits.   Lab Results  Component Value Date   CREATININE 0.83 06/01/2019   BUN 12 06/01/2019   NA 139 06/01/2019   K 4.2 06/01/2019   CL 102 06/01/2019   CO2 23 06/01/2019   Lab Results  Component Value Date   ALT 22 06/01/2019   AST 19 06/01/2019   ALKPHOS 128 (H) 06/01/2019   BILITOT 0.7 06/01/2019   Lab Results  Component Value Date   HGBA1C 5.6 06/01/2019   HGBA1C 5.7 (H) 01/12/2019   HGBA1C 5.7 (H) 02/11/2018   HGBA1C 5.3 10/08/2017   HGBA1C 5.7 (H) 02/18/2017   Lab Results  Component Value Date   INSULIN 8.7 06/01/2019   Lab Results  Component Value Date   TSH 1.460 06/01/2019   Lab Results  Component Value Date   CHOL 233 (H) 06/01/2019   HDL 86 06/01/2019   LDLCALC 134 (H) 06/01/2019   LDLDIRECT 120.2 09/05/2012   TRIG 78 06/01/2019   CHOLHDL 2.8 01/12/2019   Lab Results  Component Value Date   WBC 6.1 06/01/2019   HGB 14.3 06/01/2019  HCT 42.4 06/01/2019   MCV 92 06/01/2019   PLT 186 06/01/2019   No results found for: IRON, TIBC, FERRITIN  Obesity Behavioral Intervention Documentation for Insurance:   Approximately 15 minutes were spent on the discussion below.  ASK: We discussed the diagnosis of obesity with Doris Harris today and Doris Harris agreed to give [name] permission to discuss obesity behavioral modification therapy today.  ASSESS: Doris Harris has the diagnosis of obesity and her BMI today is 33.46. Doris Harris is in the action stage of change.   ADVISE: Doris Harris was  educated on the multiple health risks of obesity as well as the benefit of weight loss to improve her health. She was advised of the need for long term treatment and the importance of lifestyle modifications to improve her current health and to decrease her risk of future health problems.  AGREE: Multiple dietary modification options and treatment options were discussed and Doris Harris agreed to follow the recommendations documented in the above note.  ARRANGE: Doris Harris was educated on the importance of frequent visits to treat obesity as outlined per CMS and USPSTF guidelines and agreed to schedule her next follow up appointment today.  Attestation Statements:   Reviewed by clinician on day of visit: allergies, medications, problem list, medical history, surgical history, family history, social history, and previous encounter notes.   Trude Mcburney, am acting as Energy manager for [place], FNP-C.  I have reviewed the above documentation for accuracy and completeness, and I agree with the above. -  Jesse Sans, FNP

## 2019-10-15 ENCOUNTER — Other Ambulatory Visit: Payer: Self-pay

## 2019-10-15 ENCOUNTER — Encounter (INDEPENDENT_AMBULATORY_CARE_PROVIDER_SITE_OTHER): Payer: Self-pay | Admitting: Family Medicine

## 2019-10-15 ENCOUNTER — Ambulatory Visit (INDEPENDENT_AMBULATORY_CARE_PROVIDER_SITE_OTHER): Payer: Medicare Other | Admitting: Family Medicine

## 2019-10-15 VITALS — BP 142/72 | HR 64 | Temp 98.5°F | Ht 68.0 in | Wt 221.0 lb

## 2019-10-15 DIAGNOSIS — Z6833 Body mass index (BMI) 33.0-33.9, adult: Secondary | ICD-10-CM

## 2019-10-15 DIAGNOSIS — E559 Vitamin D deficiency, unspecified: Secondary | ICD-10-CM

## 2019-10-15 DIAGNOSIS — I10 Essential (primary) hypertension: Secondary | ICD-10-CM | POA: Diagnosis not present

## 2019-10-15 DIAGNOSIS — E669 Obesity, unspecified: Secondary | ICD-10-CM

## 2019-10-15 MED ORDER — VERAPAMIL HCL ER 120 MG PO TBCR: 120.0000 mg | EXTENDED_RELEASE_TABLET | Freq: Two times a day (BID) | ORAL | 0 refills | Status: DC

## 2019-10-15 MED ORDER — VITAMIN D (ERGOCALCIFEROL) 1.25 MG (50000 UNIT) PO CAPS: 50000.0000 [IU] | ORAL_CAPSULE | ORAL | 0 refills | Status: DC

## 2019-10-20 NOTE — Progress Notes (Signed)
Chief Complaint:   OBESITY Doris Harris is here to discuss her progress with her obesity treatment plan along with follow-up of her obesity related diagnoses. Doris Harris is on the Category 2 Plan or the Vegetarian Plan and states she is following her eating plan approximately 95-100% of the time. Doris Harris states she is is on the elliptical and weight lifting for 30 minutes 4-5 times per week.  Today's visit was #: 9 Starting weight: 234 lbs Starting date: 06/01/2019 Today's weight: 221 lbs Today's date: 10/15/2019 Total lbs lost to date: 13 Total lbs lost since last in-office visit: 0  Interim History: Doris Harris notes "cheating" one meal and she also had a piece of cheesecake. Her hunger is satisfied with the meal plan. She has some sabotage on the weekends from her family but she deals with this well. She exercises at the [facility] with her sister which helps motivate her.  Subjective:   1. Essential hypertension Doris Harris's blood pressure has improved today but still slightly elevated. Her blood pressure at home run between 130-140/70-80. Cardiovascular ROS: no chest pain or dyspnea on exertion.  BP Readings from Last 3 Encounters:  10/15/19 (!) 142/72  09/24/19 (!) 152/69  09/02/19 (!) 144/85   Lab Results  Component Value Date   CREATININE 0.83 06/01/2019   CREATININE 0.99 02/23/2019   CREATININE 0.87 02/11/2018   2. Vitamin D deficiency Doris Harris's last Vit D level was low at 36.7. She is on prescription Vit D.  Assessment/Plan:   1. Essential hypertension Doris Harris is working on healthy weight loss and exercise to improve blood pressure control. We will watch for signs of hypotension as she continues her lifestyle modifications. We recently increased her dose of verapamil. We will refill Verapamil for 90 days with no refills.  - verapamil (CALAN-SR) 120 MG CR tablet; Take 1 tablet (120 mg total) by mouth 2 (two) times daily.  Dispense: 180 tablet; Refill: 0  2. Vitamin D deficiency Low Vitamin D  level contributes to fatigue and are associated with obesity, breast, and colon cancer. We will refill prescription Vitamin D for 1 month. Doris Harris will follow-up for routine testing of Vitamin D, at least 2-3 times per year to avoid over-replacement. We will recheck labs at her next office visit.  - Vitamin D, Ergocalciferol, (DRISDOL) 1.25 MG (50000 UNIT) CAPS capsule; Take 1 capsule (50,000 Units total) by mouth every 7 (seven) days.  Dispense: 4 capsule; Refill: 0  3. Class 1 obesity with serious comorbidity and body mass index (BMI) of 33.0 to 33.9 in adult, unspecified obesity type Doris Harris is currently in the action stage of change. As such, her goal is to continue with weight loss efforts. She has agreed to the Category 2 Plan or the Vegetarian Plan.   Exercise goals: As is.  Behavioral modification strategies: increasing lean protein intake, decreasing simple carbohydrates and dealing with family or coworker sabotage.  Myeisha has agreed to follow-up with our clinic in 3 to 4 weeks. She was informed of the importance of frequent follow-up visits to maximize her success with intensive lifestyle modifications for her multiple health conditions.   Objective:   Blood pressure (!) 142/72, pulse 64, temperature 98.5 F (36.9 C), temperature source Oral, height 5\' 8"  (1.727 m), weight 221 lb (100.2 kg), SpO2 97 %. Body mass index is 33.6 kg/m.  General: Cooperative, alert, well developed, in no acute distress. HEENT: Conjunctivae and lids unremarkable. Cardiovascular: Regular rhythm.  Lungs: Normal work of breathing. Neurologic: No focal deficits.  Lab Results  Component Value Date   CREATININE 0.83 06/01/2019   BUN 12 06/01/2019   NA 139 06/01/2019   K 4.2 06/01/2019   CL 102 06/01/2019   CO2 23 06/01/2019   Lab Results  Component Value Date   ALT 22 06/01/2019   AST 19 06/01/2019   ALKPHOS 128 (H) 06/01/2019   BILITOT 0.7 06/01/2019   Lab Results  Component Value Date    HGBA1C 5.6 06/01/2019   HGBA1C 5.7 (H) 01/12/2019   HGBA1C 5.7 (H) 02/11/2018   HGBA1C 5.3 10/08/2017   HGBA1C 5.7 (H) 02/18/2017   Lab Results  Component Value Date   INSULIN 8.7 06/01/2019   Lab Results  Component Value Date   TSH 1.460 06/01/2019   Lab Results  Component Value Date   CHOL 233 (H) 06/01/2019   HDL 86 06/01/2019   LDLCALC 134 (H) 06/01/2019   LDLDIRECT 120.2 09/05/2012   TRIG 78 06/01/2019   CHOLHDL 2.8 01/12/2019   Lab Results  Component Value Date   WBC 6.1 06/01/2019   HGB 14.3 06/01/2019   HCT 42.4 06/01/2019   MCV 92 06/01/2019   PLT 186 06/01/2019   No results found for: IRON, TIBC, FERRITIN  Attestation Statements:   Reviewed by clinician on day of visit: allergies, medications, problem list, medical history, surgical history, family history, social history, and previous encounter notes.   Trude Mcburney, am acting as Energy manager for [place], FNP-C.  I have reviewed the above documentation for accuracy and completeness, and I agree with the above. -  Jesse Sans, FNP

## 2019-10-21 ENCOUNTER — Encounter (INDEPENDENT_AMBULATORY_CARE_PROVIDER_SITE_OTHER): Payer: Self-pay | Admitting: Family Medicine

## 2019-11-17 ENCOUNTER — Ambulatory Visit (INDEPENDENT_AMBULATORY_CARE_PROVIDER_SITE_OTHER): Payer: Medicare Other | Admitting: Family Medicine

## 2019-11-17 ENCOUNTER — Other Ambulatory Visit: Payer: Self-pay

## 2019-11-17 ENCOUNTER — Encounter (INDEPENDENT_AMBULATORY_CARE_PROVIDER_SITE_OTHER): Payer: Self-pay | Admitting: Family Medicine

## 2019-11-17 VITALS — BP 148/81 | HR 60 | Temp 98.0°F | Ht 68.0 in | Wt 221.0 lb

## 2019-11-17 DIAGNOSIS — Z6833 Body mass index (BMI) 33.0-33.9, adult: Secondary | ICD-10-CM

## 2019-11-17 DIAGNOSIS — E559 Vitamin D deficiency, unspecified: Secondary | ICD-10-CM

## 2019-11-17 DIAGNOSIS — R7303 Prediabetes: Secondary | ICD-10-CM

## 2019-11-17 DIAGNOSIS — E7849 Other hyperlipidemia: Secondary | ICD-10-CM

## 2019-11-17 DIAGNOSIS — E669 Obesity, unspecified: Secondary | ICD-10-CM

## 2019-11-17 DIAGNOSIS — I10 Essential (primary) hypertension: Secondary | ICD-10-CM

## 2019-11-17 MED ORDER — VITAMIN D (ERGOCALCIFEROL) 1.25 MG (50000 UNIT) PO CAPS: 50000.0000 [IU] | ORAL_CAPSULE | ORAL | 0 refills | Status: DC

## 2019-11-18 ENCOUNTER — Encounter (INDEPENDENT_AMBULATORY_CARE_PROVIDER_SITE_OTHER): Payer: Self-pay | Admitting: Family Medicine

## 2019-11-18 DIAGNOSIS — E7849 Other hyperlipidemia: Secondary | ICD-10-CM | POA: Insufficient documentation

## 2019-11-18 LAB — COMPREHENSIVE METABOLIC PANEL
ALT: 17 IU/L (ref 0–32)
AST: 19 IU/L (ref 0–40)
Albumin/Globulin Ratio: 1.2 (ref 1.2–2.2)
Albumin: 4.1 g/dL (ref 3.7–4.7)
Alkaline Phosphatase: 112 IU/L (ref 48–121)
BUN/Creatinine Ratio: 22 (ref 12–28)
BUN: 18 mg/dL (ref 8–27)
Bilirubin Total: 0.4 mg/dL (ref 0.0–1.2)
CO2: 22 mmol/L (ref 20–29)
Calcium: 9.3 mg/dL (ref 8.7–10.3)
Chloride: 102 mmol/L (ref 96–106)
Creatinine, Ser: 0.81 mg/dL (ref 0.57–1.00)
GFR calc Af Amer: 83 mL/min/{1.73_m2} (ref >59)
GFR calc non Af Amer: 72 mL/min/{1.73_m2} (ref >59)
Globulin, Total: 3.4 g/dL (ref 1.5–4.5)
Glucose: 91 mg/dL (ref 65–99)
Potassium: 4.2 mmol/L (ref 3.5–5.2)
Sodium: 139 mmol/L (ref 134–144)
Total Protein: 7.5 g/dL (ref 6.0–8.5)

## 2019-11-18 LAB — LIPID PANEL WITH LDL/HDL RATIO
Cholesterol, Total: 229 mg/dL — ABNORMAL HIGH (ref 100–199)
HDL: 83 mg/dL (ref >39)
LDL Chol Calc (NIH): 134 mg/dL — ABNORMAL HIGH (ref 0–99)
LDL/HDL Ratio: 1.6 ratio (ref 0.0–3.2)
Triglycerides: 72 mg/dL (ref 0–149)
VLDL Cholesterol Cal: 12 mg/dL (ref 5–40)

## 2019-11-18 LAB — INSULIN, RANDOM: INSULIN: 6.6 u[IU]/mL (ref 2.6–24.9)

## 2019-11-18 LAB — HEMOGLOBIN A1C
Est. average glucose Bld gHb Est-mCnc: 111 mg/dL
Hgb A1c MFr Bld: 5.5 % (ref 4.8–5.6)

## 2019-11-18 LAB — VITAMIN D 25 HYDROXY (VIT D DEFICIENCY, FRACTURES): Vit D, 25-Hydroxy: 39.5 ng/mL (ref 30.0–100.0)

## 2019-11-18 NOTE — Progress Notes (Signed)
Chief Complaint:   OBESITY Doris Harris is here to discuss her progress with her obesity treatment plan along with follow-up of her obesity related diagnoses. Doris Harris is on the Category 2 Plan or the Vegetarian Plan and states she is following her eating plan approximately 100% of the time. Doris Harris states she is on the elliptical bike and weight machine for 60 minutes 4-5 times per week.  Today's visit was #: 10 Starting weight: 234 lbs Starting date: 06/01/2019 Today's weight: 221 lbs Today's date: 11/17/2019 Total lbs lost to date: 13 Total lbs lost since last in-office visit: 0  Interim History: Doris Harris is frustrated with her lack of weight loss. She reports adhering to the plan 100%, although she does drink 2% milk rather than skim. She notes polyphagia in bed at night sometimes.  Subjective:   1. Essential hypertension Doris Harris's blood pressure is elevated today, and has been elevated the last few visits. Her home blood pressure range between 120-130's/70-80's. Cardiovascular ROS: no chest pain or dyspnea on exertion.  BP Readings from Last 3 Encounters:  11/17/19 (!) 148/81  10/15/19 (!) 142/72  09/24/19 (!) 152/69   Lab Results  Component Value Date   CREATININE 0.81 11/17/2019   CREATININE 0.83 06/01/2019   CREATININE 0.99 02/23/2019   2. Vitamin D deficiency Doris Harris is on prescription Vit D, and her last Vit D level was low at 36.7.  3. Pre-diabetes Doris Harris has a diagnosis of pre-diabetes based on her elevated Hgb A1c and was informed this puts her at greater risk of developing diabetes. She is not on metformin, and she notes polyphagia at night. She continues to work on diet and exercise to decrease her risk of diabetes. She denies nausea or hypoglycemia.  Lab Results  Component Value Date   HGBA1C 5.5 11/17/2019   Lab Results  Component Value Date   INSULIN 6.6 11/17/2019   INSULIN 8.7 06/01/2019   4. Other hyperlipidemia Doris Harris has hyperlipidemia and has been trying to  improve her cholesterol levels with intensive lifestyle modification including a low saturated fat diet, exercise and weight loss. Last LDL was elevated at 134, and HDL and triglycerides were within normal limits. She is not on statin. She denies any chest pain, claudication or myalgias. The 10-year ASCVD risk score Doris George DC Montez Hageman., et al., 2013) is: 21.7%   Values used to calculate the score:     Age: 74 years     Sex: Female     Is Non-Hispanic African American: Yes     Diabetic: No     Tobacco smoker: No     Systolic Blood Pressure: 148 mmHg     Is BP treated: Yes     HDL Cholesterol: 83 mg/dL     Total Cholesterol: 229 mg/dL   Lab Results  Component Value Date   ALT 17 11/17/2019   AST 19 11/17/2019   ALKPHOS 112 11/17/2019   BILITOT 0.4 11/17/2019   Lab Results  Component Value Date   CHOL 229 (H) 11/17/2019   HDL 83 11/17/2019   LDLCALC 134 (H) 11/17/2019   LDLDIRECT 120.2 09/05/2012   TRIG 72 11/17/2019   CHOLHDL 2.8 01/12/2019   Assessment/Plan:   1. Essential hypertension Venora is working on healthy weight loss and exercise to improve blood pressure control. We will watch for signs of hypotension as she continues her lifestyle modifications. We will check labs today. Ramina will continue to monitor her home blood pressures.  - Comprehensive metabolic panel  2.  Vitamin D deficiency Low Vitamin D level contributes to fatigue and are associated with obesity, breast, and colon cancer. We will check labs today, and we will refill prescription Vitamin D for 1 month. Doris Harris will follow-up for routine testing of Vitamin D, at least 2-3 times per year to avoid over-replacement.  - VITAMIN D 25 Hydroxy (Vit-D Deficiency, Fractures) - Vitamin D, Ergocalciferol, (DRISDOL) 1.25 MG (50000 UNIT) CAPS capsule; Take 1 capsule (50,000 Units total) by mouth every 7 (seven) days.  Dispense: 4 capsule; Refill: 0  3. Pre-diabetes Doris Harris will continue to work on weight loss, exercise, and  decreasing simple carbohydrates to help decrease the risk of diabetes. We will check labs today.  - Hemoglobin A1c - Insulin, random  4. Other hyperlipidemia Cardiovascular risk and specific lipid/LDL goals reviewed. We discussed several lifestyle modifications today and Doris Harris will continue to work on diet, exercise and weight loss efforts. We will check labs today.  - Lipid Panel With LDL/HDL Ratio  5. Class 1 obesity with serious comorbidity and body mass index (BMI) of 33.0 to 33.9 in adult, unspecified obesity type Doris Harris is currently in the action stage of change. As such, her goal is to continue with weight loss efforts. She has agreed to the Category 1 Plan.   Handout given today: 100 Calorie Snacks. I recommended a protein snack after supper.  Exercise goals: As is.  Behavioral modification strategies: increasing lean protein intake and better snacking choices.  Doris Harris has agreed to follow-up with our clinic in 3 weeks. She was informed of the importance of frequent follow-up visits to maximize her success with intensive lifestyle modifications for her multiple health conditions.   Doris Harris was informed we would discuss her lab results at her next visit unless there is a critical issue that needs to be addressed sooner. Doris Harris agreed to keep her next visit at the agreed upon time to discuss these results.  Objective:   Blood pressure (!) 148/81, pulse 60, temperature 98 F (36.7 C), temperature source Oral, height 5\' 8"  (1.727 m), weight 221 lb (100.2 kg), SpO2 98 %. Body mass index is 33.6 kg/m.  General: Cooperative, alert, well developed, in no acute distress. HEENT: Conjunctivae and lids unremarkable. Cardiovascular: Regular rhythm.  Lungs: Normal work of breathing. Neurologic: No focal deficits.   Lab Results  Component Value Date   CREATININE 0.81 11/17/2019   BUN 18 11/17/2019   NA 139 11/17/2019   K 4.2 11/17/2019   CL 102 11/17/2019   CO2 22 11/17/2019   Lab  Results  Component Value Date   ALT 17 11/17/2019   AST 19 11/17/2019   ALKPHOS 112 11/17/2019   BILITOT 0.4 11/17/2019   Lab Results  Component Value Date   HGBA1C 5.5 11/17/2019   HGBA1C 5.6 06/01/2019   HGBA1C 5.7 (H) 01/12/2019   HGBA1C 5.7 (H) 02/11/2018   HGBA1C 5.3 10/08/2017   Lab Results  Component Value Date   INSULIN 6.6 11/17/2019   INSULIN 8.7 06/01/2019   Lab Results  Component Value Date   TSH 1.460 06/01/2019   Lab Results  Component Value Date   CHOL 229 (H) 11/17/2019   HDL 83 11/17/2019   LDLCALC 134 (H) 11/17/2019   LDLDIRECT 120.2 09/05/2012   TRIG 72 11/17/2019   CHOLHDL 2.8 01/12/2019   Lab Results  Component Value Date   WBC 6.1 06/01/2019   HGB 14.3 06/01/2019   HCT 42.4 06/01/2019   MCV 92 06/01/2019   PLT 186 06/01/2019  No results found for: IRON, TIBC, FERRITIN  Obesity Behavioral Intervention Documentation for Insurance:   Approximately 15 minutes were spent on the discussion below.  ASK: We discussed the diagnosis of obesity with Doris Harris today and Doris Harris agreed to give [name] permission to discuss obesity behavioral modification therapy today.  ASSESS: Camera has the diagnosis of obesity and her BMI today is 33.61. Doris Harris is in the action stage of change.   ADVISE: Doris Harris was educated on the multiple health risks of obesity as well as the benefit of weight loss to improve her health. She was advised of the need for long term treatment and the importance of lifestyle modifications to improve her current health and to decrease her risk of future health problems.  AGREE: Multiple dietary modification options and treatment options were discussed and Doris Harris agreed to follow the recommendations documented in the above note.  ARRANGE: Doris Harris was educated on the importance of frequent visits to treat obesity as outlined per CMS and USPSTF guidelines and agreed to schedule her next follow up appointment today.  Attestation Statements:    Reviewed by clinician on day of visit: allergies, medications, problem list, medical history, surgical history, family history, social history, and previous encounter notes.   Trude Mcburney, am acting as Energy manager for [employer], FNP-C.  I have reviewed the above documentation for accuracy and completeness, and I agree with the above. -  Jesse Sans, FNP

## 2019-11-23 ENCOUNTER — Other Ambulatory Visit: Payer: Self-pay | Admitting: Physician Assistant

## 2019-11-23 DIAGNOSIS — I10 Essential (primary) hypertension: Secondary | ICD-10-CM

## 2019-12-08 ENCOUNTER — Other Ambulatory Visit: Payer: Self-pay

## 2019-12-08 ENCOUNTER — Encounter (INDEPENDENT_AMBULATORY_CARE_PROVIDER_SITE_OTHER): Payer: Self-pay | Admitting: Family Medicine

## 2019-12-08 ENCOUNTER — Ambulatory Visit (INDEPENDENT_AMBULATORY_CARE_PROVIDER_SITE_OTHER): Payer: Medicare Other | Admitting: Family Medicine

## 2019-12-08 VITALS — BP 152/81 | HR 61 | Temp 98.0°F | Ht 68.0 in | Wt 218.0 lb

## 2019-12-08 DIAGNOSIS — I10 Essential (primary) hypertension: Secondary | ICD-10-CM | POA: Diagnosis not present

## 2019-12-08 DIAGNOSIS — E669 Obesity, unspecified: Secondary | ICD-10-CM | POA: Diagnosis not present

## 2019-12-08 DIAGNOSIS — E7849 Other hyperlipidemia: Secondary | ICD-10-CM

## 2019-12-08 DIAGNOSIS — Z6833 Body mass index (BMI) 33.0-33.9, adult: Secondary | ICD-10-CM | POA: Diagnosis not present

## 2019-12-08 DIAGNOSIS — E559 Vitamin D deficiency, unspecified: Secondary | ICD-10-CM | POA: Diagnosis not present

## 2019-12-08 MED ORDER — VITAMIN D (ERGOCALCIFEROL) 1.25 MG (50000 UNIT) PO CAPS: 50000.0000 [IU] | ORAL_CAPSULE | ORAL | 0 refills | Status: DC

## 2019-12-08 NOTE — Progress Notes (Signed)
Chief Complaint:   OBESITY Doris Harris is here to discuss her progress with her obesity treatment plan along with follow-up of her obesity related diagnoses. Doris Harris is on the Category 1 Plan and states she is following her eating plan approximately 100% of the time. Doris Harris states she is on the elliptical and stationary bike, and weights for 60 minutes 4-5 times per week.  Today's visit was #: 11 Starting weight: 234 lbs Starting date: 06/01/2019 Today's weight: 218 lbs Today's date: 12/08/2019 Total lbs lost to date: 16 Total lbs lost since last in-office visit: 3  Interim History: Doris Harris has adhered to the plan very well. She is happy with her 3 lb weight loss. Her hunger is satisfied. She is doing a great job with exercise. She is down 16 lbs overall.  Subjective:   1. Other hyperlipidemia Doris Harris has been on Lipitor in the past which caused cough and joint pain. She is not currently on a statin. Her ASCVD risk score is 22.5%. Last LDL was 134, and HDL and triglycerides were within normal limits. No change with 16 lb weight loss. She denies any chest pain, claudication or myalgias. I discussed labs with the patient today.  Lab Results  Component Value Date   ALT 17 11/17/2019   AST 19 11/17/2019   ALKPHOS 112 11/17/2019   BILITOT 0.4 11/17/2019   Lab Results  Component Value Date   CHOL 229 (H) 11/17/2019   HDL 83 11/17/2019   LDLCALC 134 (H) 11/17/2019   LDLDIRECT 120.2 09/05/2012   TRIG 72 11/17/2019   CHOLHDL 2.8 01/12/2019  The 10-year ASCVD risk score Denman George DC Jr., et al., 2013) is: 22.5%   Values used to calculate the score:     Age: 73 years     Sex: Female     Is Non-Hispanic African American: Yes     Diabetic: No     Tobacco smoker: No     Systolic Blood Pressure: 152 mmHg     Is BP treated: Yes     HDL Cholesterol: 83 mg/dL     Total Cholesterol: 229 mg/dL  2. Essential hypertension Allean's blood pressure's are consistently elevated as it it today (152/81). She  will see her primary care provider on September 20th and will discuss this. She is compliant with all her medications. Cardiovascular ROS: no chest pain or dyspnea on exertion.  BP Readings from Last 3 Encounters:  12/08/19 (!) 152/81  11/17/19 (!) 148/81  10/15/19 (!) 142/72   Lab Results  Component Value Date   CREATININE 0.81 11/17/2019   CREATININE 0.83 06/01/2019   CREATININE 0.99 02/23/2019   3. Vitamin D deficiency Doris Harris's last Vit D level was low at 39.5. She is compliant with prescription Vit D. It has only increased from 36 to 39 in 6 months. I discussed labs with the patient today.  Assessment/Plan:   1. Other hyperlipidemia I recommended a statin and she declines. I suggested she discuss with her PCP at upcoming visit.   2. Essential hypertension She will discuss BP management with PCP at upcoming visit.   3. Vitamin D deficiency Low Vitamin D level contributes to fatigue and are associated with obesity, breast, and colon cancer. We will refill prescription Vitamin D for 1 month. Doris Harris will follow-up for routine testing of Vitamin D, at least 2-3 times per year to avoid over-replacement.  - Vitamin D, Ergocalciferol, (DRISDOL) 1.25 MG (50000 UNIT) CAPS capsule; Take 1 capsule (50,000 Units total) by mouth  every 7 (seven) days.  Dispense: 4 capsule; Refill: 0  4. Class 1 obesity with serious comorbidity and body mass index (BMI) of 33.0 to 33.9 in adult, unspecified obesity type Doris Harris is currently in the action stage of change. As such, her goal is to continue with weight loss efforts. She has agreed to the Category 1 Plan or the Vegetarian Plan.   Exercise goals: As is.  Behavioral modification strategies: planning for success.  Doris Harris has agreed to follow-up with our clinic in 3 weeks. She was informed of the importance of frequent follow-up visits to maximize her success with intensive lifestyle modifications for her multiple health conditions.   Objective:    Blood pressure (!) 152/81, pulse 61, temperature 98 F (36.7 C), height 5\' 8"  (1.727 m), weight 218 lb (98.9 kg), SpO2 98 %. Body mass index is 33.15 kg/m.  General: Cooperative, alert, well developed, in no acute distress. HEENT: Conjunctivae and lids unremarkable. Cardiovascular: Regular rhythm.  Lungs: Normal work of breathing. Neurologic: No focal deficits.   Lab Results  Component Value Date   CREATININE 0.81 11/17/2019   BUN 18 11/17/2019   NA 139 11/17/2019   K 4.2 11/17/2019   CL 102 11/17/2019   CO2 22 11/17/2019   Lab Results  Component Value Date   ALT 17 11/17/2019   AST 19 11/17/2019   ALKPHOS 112 11/17/2019   BILITOT 0.4 11/17/2019   Lab Results  Component Value Date   HGBA1C 5.5 11/17/2019   HGBA1C 5.6 06/01/2019   HGBA1C 5.7 (H) 01/12/2019   HGBA1C 5.7 (H) 02/11/2018   HGBA1C 5.3 10/08/2017   Lab Results  Component Value Date   INSULIN 6.6 11/17/2019   INSULIN 8.7 06/01/2019   Lab Results  Component Value Date   TSH 1.460 06/01/2019   Lab Results  Component Value Date   CHOL 229 (H) 11/17/2019   HDL 83 11/17/2019   LDLCALC 134 (H) 11/17/2019   LDLDIRECT 120.2 09/05/2012   TRIG 72 11/17/2019   CHOLHDL 2.8 01/12/2019   Lab Results  Component Value Date   WBC 6.1 06/01/2019   HGB 14.3 06/01/2019   HCT 42.4 06/01/2019   MCV 92 06/01/2019   PLT 186 06/01/2019   No results found for: IRON, TIBC, FERRITIN  Obesity Behavioral Intervention Documentation for Insurance:   Approximately 15 minutes were spent on the discussion below.  ASK: We discussed the diagnosis of obesity with Doris Harris today and Doris Harris agreed to give 06/03/2019 permission to discuss obesity behavioral modification therapy today.  ASSESS: Doris Harris has the diagnosis of obesity and her BMI today is 33.15. Doris Harris is in the action stage of change.   ADVISE: Doris Harris was educated on the multiple health risks of obesity as well as the benefit of weight loss to improve her health. She was  advised of the need for long term treatment and the importance of lifestyle modifications to improve her current health and to decrease her risk of future health problems.  AGREE: Multiple dietary modification options and treatment options were discussed and Doris Harris agreed to follow the recommendations documented in the above note.  ARRANGE: Doris Harris was educated on the importance of frequent visits to treat obesity as outlined per CMS and USPSTF guidelines and agreed to schedule her next follow up appointment today.  Attestation Statements:   Reviewed by clinician on day of visit: allergies, medications, problem list, medical history, surgical history, family history, social history, and previous encounter notes.   Manson Allan, am acting  as Location manager for [employer], FNP-C.  I have reviewed the above documentation for accuracy and completeness, and I agree with the above. -  Georgianne Fick, FNP

## 2019-12-08 NOTE — Progress Notes (Signed)
The 10-year ASCVD risk score Denman George DC Montez Hageman., et al., 2013) is: 22.5%   Values used to calculate the score:     Age: 74 years     Sex: Female     Is Non-Hispanic African American: Yes     Diabetic: No     Tobacco smoker: No     Systolic Blood Pressure: 152 mmHg     Is BP treated: Yes     HDL Cholesterol: 83 mg/dL     Total Cholesterol: 229 mg/dL

## 2019-12-16 ENCOUNTER — Other Ambulatory Visit: Payer: Self-pay

## 2019-12-16 ENCOUNTER — Other Ambulatory Visit: Payer: Medicare Other

## 2019-12-16 ENCOUNTER — Other Ambulatory Visit (INDEPENDENT_AMBULATORY_CARE_PROVIDER_SITE_OTHER): Payer: Self-pay | Admitting: Family Medicine

## 2019-12-16 DIAGNOSIS — R300 Dysuria: Secondary | ICD-10-CM

## 2019-12-16 DIAGNOSIS — I10 Essential (primary) hypertension: Secondary | ICD-10-CM

## 2019-12-17 ENCOUNTER — Ambulatory Visit: Payer: Medicare Other

## 2019-12-19 ENCOUNTER — Other Ambulatory Visit (INDEPENDENT_AMBULATORY_CARE_PROVIDER_SITE_OTHER): Payer: Self-pay | Admitting: Family Medicine

## 2019-12-19 DIAGNOSIS — I10 Essential (primary) hypertension: Secondary | ICD-10-CM

## 2019-12-19 DIAGNOSIS — E559 Vitamin D deficiency, unspecified: Secondary | ICD-10-CM

## 2019-12-19 LAB — URINE CULTURE

## 2019-12-21 ENCOUNTER — Telehealth: Payer: Self-pay | Admitting: Physician Assistant

## 2019-12-21 DIAGNOSIS — N3000 Acute cystitis without hematuria: Secondary | ICD-10-CM

## 2019-12-21 MED ORDER — NITROFURANTOIN MONOHYD MACRO 100 MG PO CAPS: 100.0000 mg | ORAL_CAPSULE | Freq: Two times a day (BID) | ORAL | 0 refills | Status: DC

## 2019-12-21 NOTE — Telephone Encounter (Signed)
Patient requesting a update on urine culture done last week, please contact when available

## 2019-12-21 NOTE — Telephone Encounter (Signed)
Urine culture positive for E. Coli, sent rx for Nitrofurantoin.  Mayer Masker, PA-C

## 2019-12-21 NOTE — Telephone Encounter (Signed)
Patient is aware of lab results. AS, CMA

## 2019-12-28 ENCOUNTER — Ambulatory Visit (INDEPENDENT_AMBULATORY_CARE_PROVIDER_SITE_OTHER): Payer: Medicare Other | Admitting: Physician Assistant

## 2019-12-28 ENCOUNTER — Other Ambulatory Visit: Payer: Self-pay

## 2019-12-28 ENCOUNTER — Encounter: Payer: Self-pay | Admitting: Physician Assistant

## 2019-12-28 VITALS — BP 148/75 | HR 62 | Ht 68.0 in | Wt 222.2 lb

## 2019-12-28 DIAGNOSIS — E785 Hyperlipidemia, unspecified: Secondary | ICD-10-CM

## 2019-12-28 DIAGNOSIS — Z6833 Body mass index (BMI) 33.0-33.9, adult: Secondary | ICD-10-CM

## 2019-12-28 DIAGNOSIS — R7303 Prediabetes: Secondary | ICD-10-CM

## 2019-12-28 DIAGNOSIS — E669 Obesity, unspecified: Secondary | ICD-10-CM

## 2019-12-28 DIAGNOSIS — I10 Essential (primary) hypertension: Secondary | ICD-10-CM | POA: Diagnosis not present

## 2019-12-28 MED ORDER — VERAPAMIL HCL ER 120 MG PO TBCR: 120.0000 mg | EXTENDED_RELEASE_TABLET | Freq: Two times a day (BID) | ORAL | 0 refills | Status: DC

## 2019-12-28 MED ORDER — METOPROLOL SUCCINATE ER 100 MG PO TB24: 100.0000 mg | ORAL_TABLET | Freq: Every day | ORAL | 0 refills | Status: DC

## 2019-12-28 MED ORDER — OLMESARTAN MEDOXOMIL 40 MG PO TABS: 40.0000 mg | ORAL_TABLET | Freq: Every day | ORAL | 0 refills | Status: DC

## 2019-12-28 NOTE — Progress Notes (Signed)
Established Patient Office Visit  Subjective:  Patient ID: Doris Harris, female    DOB: Mar 06, 1946  Age: 74 y.o. MRN: [id number]  CC:  Chief Complaint  Patient presents with  . Hypertension  . Prediabetes    HPI Doris Harris presents for follow up on hypertension and prediabetes. Pt reports she is doing well and has no acute concerns. She completed antibiotic therapy and her urinary symptoms have resolved.   HTN: Pt denies chest pain, palpitations, dizziness or leg swelling. Taking medication as directed without side effects. Checks BP at home and readings fluctaute. Pt reports when she was only taking Lisinopril her blood pressure was much better controlled but developed a cough and had to discontinue it. Pt follows a low salt diet.   Prediabetes: Asymptomatic. She monitors carbohydrates and glucose. She goes to the [facility].   Past Medical History:  Diagnosis Date  . Back pain   . Hypertension   . IFG (impaired fasting glucose)    x1  . Joint pain   . Obesity   . Prediabetes   . Vitamin D deficiency     Past Surgical History:  Procedure Laterality Date  . MM BREAST STEREO BX*L*R/S    . TOTAL ABDOMINAL HYSTERECTOMY  1997   fibroid  . uterine polyps    . WRIST FRACTURE SURGERY  2000    Family History  Problem Relation Age of Onset  . Hypertension Mother   . Hyperlipidemia Mother   . Heart disease Mother   . Hypertension Father   . Hypertension Brother   . Diabetes Maternal Grandmother   . Coronary artery disease Other   . Hypertension Other   . Asthma Other   . Hypertension Brother   . Hypertension Sister   . Healthy Son   . Hypertension Sister   . Healthy Sister   . Hypertension Brother   . Healthy Brother   . Healthy Brother   . Healthy Brother   . Heart attack Brother     Social History   Socioeconomic History  . Marital status: Single    Spouse name: Not on file  . Number of children: Not on file  . Years of education: Not on file  . Highest  education level: Not on file  Occupational History  . Not on file  Tobacco Use  . Smoking status: Never Smoker  . Smokeless tobacco: Never Used  Vaping Use  . Vaping Use: Never used  Substance and Sexual Activity  . Alcohol use: No  . Drug use: No  . Sexual activity: Never  Other Topics Concern  . Not on file  Social History Narrative   Retired 12 13    Exercise  Neg tad    Never smoker   Sleep 8 or more hours   hh of 1  Attends 28+ yo days    No pets       Social Determinants of Corporate investment banker Strain:   . Difficulty of Paying Living Expenses: Not on file  Food Insecurity:   . Worried About Programme researcher, broadcasting/film/video in the Last Year: Not on file  . Ran Out of Food in the Last Year: Not on file  Transportation Needs:   . Lack of Transportation (Medical): Not on file  . Lack of Transportation (Non-Medical): Not on file  Physical Activity:   . Days of Exercise per Week: Not on file  . Minutes of Exercise per Session: Not on file  Stress:   . Feeling of Stress : Not on file  Social Connections:   . Frequency of Communication with Friends and Family: Not on file  . Frequency of Social Gatherings with Friends and Family: Not on file  . Attends Religious Services: Not on file  . Active Member of Clubs or Organizations: Not on file  . Attends Banker Meetings: Not on file  . Marital Status: Not on file  Intimate Partner Violence:   . Fear of Current or Ex-Partner: Not on file  . Emotionally Abused: Not on file  . Physically Abused: Not on file  . Sexually Abused: Not on file    Outpatient Medications Prior to Visit  Medication Sig Dispense Refill  . metoprolol succinate (TOPROL-XL) 100 MG 24 hr tablet TAKE 1 TABLET BY MOUTH  DAILY 90 tablet 0  . nitrofurantoin, macrocrystal-monohydrate, (MACROBID) 100 MG capsule Take 1 capsule (100 mg total) by mouth 2 (two) times daily. 10 capsule 0  . olmesartan (BENICAR) 40 MG tablet TAKE 1 TABLET BY MOUTH  DAILY  90 tablet 0  . verapamil (CALAN-SR) 120 MG CR tablet Take 1 tablet (120 mg total) by mouth 2 (two) times daily. 180 tablet 0  . Vitamin D, Ergocalciferol, (DRISDOL) 1.25 MG (50000 UNIT) CAPS capsule Take 1 capsule (50,000 Units total) by mouth every 7 (seven) days. 4 capsule 0   No facility-administered medications prior to visit.    Allergies  Allergen Reactions  . Amlodipine Nausea And Vomiting  . Hctz [Hydrochlorothiazide] Other (See Comments)    Freq urination- 10+ times/ day  . Ibuprofen     REACTION: unspecified  . Lipitor [Atorvastatin Calcium] Other (See Comments)    Joint pain  . Lisinopril Cough  . Benazepril Rash  . Valsartan-Hydrochlorothiazide Rash    ROS Review of Systems A fourteen system review of systems was performed and found to be positive as per HPI.  Objective:    Physical Exam General:  Well Developed, well nourished, appropriate for stated age.  Neuro:  Alert and oriented,  extra-ocular muscles intact, no focal deficits  HEENT:  Normocephalic, atraumatic, neck supple Skin:  no gross rash, warm, pink. Cardiac:  RRR, S1 S2 Respiratory:  ECTA B/L and A/P, Not using accessory muscles, speaking in full sentences- unlabored. Vascular:  Ext warm, no cyanosis apprec.; cap RF less 2 sec. Psych:  No HI/SI, judgement and insight good, Euthymic mood. Full Affect.  BP (!) 148/75   Pulse 62   Ht 5\' 8"  (1.727 m)   Wt 222 lb 3.2 oz (100.8 kg)   SpO2 99%   BMI 33.79 kg/m  Wt Readings from Last 3 Encounters:  12/29/19 219 lb (99.3 kg)  12/28/19 222 lb 3.2 oz (100.8 kg)  12/08/19 218 lb (98.9 kg)     Health Maintenance Due  Topic Date Due  . COVID-19 Vaccine (1) Never done  . MAMMOGRAM  01/30/2009  . DEXA SCAN  Never done  . COLONOSCOPY  04/09/2012  . PNA vac Low Risk Adult (2 of 2 - PPSV23) 02/22/2016  . INFLUENZA VACCINE  Never done    There are no preventive care reminders to display for this patient.  Lab Results  Component Value Date   TSH  1.460 06/01/2019   Lab Results  Component Value Date   WBC 6.1 06/01/2019   HGB 14.3 06/01/2019   HCT 42.4 06/01/2019   MCV 92 06/01/2019   PLT 186 06/01/2019   Lab Results  Component Value Date  NA 139 11/17/2019   K 4.2 11/17/2019   CO2 22 11/17/2019   GLUCOSE 91 11/17/2019   BUN 18 11/17/2019   CREATININE 0.81 11/17/2019   BILITOT 0.4 11/17/2019   ALKPHOS 112 11/17/2019   AST 19 11/17/2019   ALT 17 11/17/2019   PROT 7.5 11/17/2019   ALBUMIN 4.1 11/17/2019   CALCIUM 9.3 11/17/2019   GFR 81.03 02/22/2015   Lab Results  Component Value Date   CHOL 229 (H) 11/17/2019   Lab Results  Component Value Date   HDL 83 11/17/2019   Lab Results  Component Value Date   LDLCALC 134 (H) 11/17/2019   Lab Results  Component Value Date   TRIG 72 11/17/2019   Lab Results  Component Value Date   CHOLHDL 2.8 01/12/2019   Lab Results  Component Value Date   HGBA1C 5.5 11/17/2019      Assessment & Plan:   Problem List Items Addressed This Visit      Cardiovascular and Mediastinum   Essential hypertension (Chronic)   Relevant Medications   metoprolol succinate (TOPROL-XL) 100 MG 24 hr tablet   olmesartan (BENICAR) 40 MG tablet   verapamil (CALAN-SR) 120 MG CR tablet     Other   Prediabetes - Primary (Chronic)   Class 1 obesity with serious comorbidity and body mass index (BMI) of 33.0 to 33.9 in adult    Other Visit Diagnoses    Hyperlipidemia, unspecified hyperlipidemia type-  declines all meds       Relevant Medications   metoprolol succinate (TOPROL-XL) 100 MG 24 hr tablet   olmesartan (BENICAR) 40 MG tablet   verapamil (CALAN-SR) 120 MG CR tablet     Essential Hypertension: - BP is stable, patient's baseline in the office has been 140s/70s. - Continue current medication regimen. If BP continues to fluctuate and consistently >140/90 then will consider adjusting Verapamil dose.  - Continue ambulatory BP and pulse monitoring. - Continue DASH diet. -  Continue with weight loss efforts.  Prediabetes: -Asymptomatic. -Last A1c stable -Continue to follow low carbohydrate and glucose diet. -Will continue to monitor.  Hyperlipidemia:  -Last lipid panel: total cholesterol 229, triglycerides 72, HDL 83, LDL 134 -Pt has been evaluated by cardiology and she declined a calcium score and declined trying a new statin medication. Reports history of statin intolerance to Lipitor due to arthralgia.  -Continue with weight loss efforts and follow a heart healthy diet. -Continue to stay active.  Class 1 obesity with serious comorbidity and BMI of 33.0 to 33.9 in adult: -Associated with hypertension and hyperlipidemia. -Continue with weight loss efforts and continue to follow up with [facility].     Meds ordered this encounter  Medications  . metoprolol succinate (TOPROL-XL) 100 MG 24 hr tablet    Sig: Take 1 tablet (100 mg total) by mouth daily. Take with or immediately following a meal.    Dispense:  90 tablet    Refill:  0    Requesting 1 year supply  . olmesartan (BENICAR) 40 MG tablet    Sig: Take 1 tablet (40 mg total) by mouth daily.    Dispense:  90 tablet    Refill:  0    Requesting 1 year supply  . verapamil (CALAN-SR) 120 MG CR tablet    Sig: Take 1 tablet (120 mg total) by mouth 2 (two) times daily.    Dispense:  180 tablet    Refill:  0    Requesting 1 year supply  Follow-up: Return for HTN, HLD in 4 months.   Note:  This note was prepared with assistance of Dragon voice recognition software. Occasional wrong-word or sound-a-like substitutions may have occurred due to the inherent limitations of voice recognition software.  Mayer Masker, PA-C

## 2019-12-29 ENCOUNTER — Encounter (INDEPENDENT_AMBULATORY_CARE_PROVIDER_SITE_OTHER): Payer: Self-pay | Admitting: Family Medicine

## 2019-12-29 ENCOUNTER — Ambulatory Visit (INDEPENDENT_AMBULATORY_CARE_PROVIDER_SITE_OTHER): Payer: Medicare Other | Admitting: Family Medicine

## 2019-12-29 VITALS — BP 139/81 | HR 59 | Temp 98.1°F | Ht 68.0 in | Wt 219.0 lb

## 2019-12-29 DIAGNOSIS — I10 Essential (primary) hypertension: Secondary | ICD-10-CM | POA: Diagnosis not present

## 2019-12-29 DIAGNOSIS — Z6833 Body mass index (BMI) 33.0-33.9, adult: Secondary | ICD-10-CM

## 2019-12-29 DIAGNOSIS — E669 Obesity, unspecified: Secondary | ICD-10-CM | POA: Diagnosis not present

## 2019-12-29 DIAGNOSIS — E559 Vitamin D deficiency, unspecified: Secondary | ICD-10-CM | POA: Diagnosis not present

## 2019-12-29 MED ORDER — VITAMIN D (ERGOCALCIFEROL) 1.25 MG (50000 UNIT) PO CAPS: 50000.0000 [IU] | ORAL_CAPSULE | ORAL | 0 refills | Status: DC

## 2019-12-30 ENCOUNTER — Encounter (INDEPENDENT_AMBULATORY_CARE_PROVIDER_SITE_OTHER): Payer: Self-pay | Admitting: Family Medicine

## 2019-12-30 NOTE — Progress Notes (Signed)
Chief Complaint:   OBESITY Doris Harris is here to discuss her progress with her obesity treatment plan along with follow-up of her obesity related diagnoses. Doris Harris is on the Category 1 Plan or the Vegetarian Plan and states she is following her eating plan approximately 100% of the time. Doris Harris states she is doing strengthening and cardio for 60 minutes 5-6 times per week.  Today's visit was #: 12 Starting weight: 234 lbs Starting date: 06/01/2019 Today's weight: 219 lbs Today's date: 12/29/2019 Total lbs lost to date: 15 Total lbs lost since last in-office visit: 0  Interim History: Doris Harris has adhered to the plan very well and is disappointed with lack of weight loss. She is getting in the prescribed protein and not skipping meals. Her appetite is well controlled. She is working out at [employer] 5-6 times per week. Her muscle mass has decreased to 113.4 from 117.4 on 06/15/2019.  Subjective:   1. Essential hypertension Doris Harris's blood pressure is well controlled on Benicar, metoprolol, and verapamil.   BP Readings from Last 3 Encounters:  12/29/19 139/81  12/28/19 (!) 148/75  12/08/19 (!) 152/81   Lab Results  Component Value Date   CREATININE 0.81 11/17/2019   CREATININE 0.83 06/01/2019   CREATININE 0.99 02/23/2019   2. Vitamin D deficiency Doris Harris's last Vit D level was low at 39.5. She is on weekly prescription Vit D.  Assessment/Plan:   1. Essential hypertension Doris Harris will continue all her medications.  2. Vitamin D deficiency We will refill prescription Vitamin D for 1 month.  - Vitamin D, Ergocalciferol, (DRISDOL) 1.25 MG (50000 UNIT) CAPS capsule; Take 1 capsule (50,000 Units total) by mouth every 7 (seven) days.  Dispense: 4 capsule; Refill: 0  3. Class 1 obesity with serious comorbidity and body mass index (BMI) of 33.0 to 33.9 in adult, unspecified obesity type Doris Harris is currently in the action stage of change. As such, her goal is to continue with weight loss efforts.  She has agreed to the Category 1 Plan or the Vegetarian Plan.   Exercise goals: No exercise has been prescribed at this time.  Behavioral modification strategies: decreasing simple carbohydrates and planning for success.  Doris Harris has agreed to follow-up with our clinic in 3 weeks.  Objective:   Blood pressure 139/81, pulse (!) 59, temperature 98.1 F (36.7 C), temperature source Oral, height 5\' 8"  (1.727 m), weight 219 lb (99.3 kg), SpO2 99 %. Body mass index is 33.3 kg/m.  General: Cooperative, alert, well developed, in no acute distress. HEENT: Conjunctivae and lids unremarkable. Cardiovascular: Regular rhythm.  Lungs: Normal work of breathing. Neurologic: No focal deficits.   Lab Results  Component Value Date   CREATININE 0.81 11/17/2019   BUN 18 11/17/2019   NA 139 11/17/2019   K 4.2 11/17/2019   CL 102 11/17/2019   CO2 22 11/17/2019   Lab Results  Component Value Date   ALT 17 11/17/2019   AST 19 11/17/2019   ALKPHOS 112 11/17/2019   BILITOT 0.4 11/17/2019   Lab Results  Component Value Date   HGBA1C 5.5 11/17/2019   HGBA1C 5.6 06/01/2019   HGBA1C 5.7 (H) 01/12/2019   HGBA1C 5.7 (H) 02/11/2018   HGBA1C 5.3 10/08/2017   Lab Results  Component Value Date   INSULIN 6.6 11/17/2019   INSULIN 8.7 06/01/2019   Lab Results  Component Value Date   TSH 1.460 06/01/2019   Lab Results  Component Value Date   CHOL 229 (H) 11/17/2019  HDL 83 11/17/2019   LDLCALC 134 (H) 11/17/2019   LDLDIRECT 120.2 09/05/2012   TRIG 72 11/17/2019   CHOLHDL 2.8 01/12/2019   Lab Results  Component Value Date   WBC 6.1 06/01/2019   HGB 14.3 06/01/2019   HCT 42.4 06/01/2019   MCV 92 06/01/2019   PLT 186 06/01/2019   No results found for: IRON, TIBC, FERRITIN  Attestation Statements:   Reviewed by clinician on day of visit: allergies, medications, problem list, medical history, surgical history, family history, social history, and previous encounter notes.   Doris Harris, am acting as Energy manager for [place], FNP-C.  I have reviewed the above documentation for accuracy and completeness, and I agree with the above. -  Doris Sans, FNP

## 2020-01-19 ENCOUNTER — Ambulatory Visit (INDEPENDENT_AMBULATORY_CARE_PROVIDER_SITE_OTHER): Payer: Medicare Other | Admitting: Family Medicine

## 2020-01-19 ENCOUNTER — Encounter (INDEPENDENT_AMBULATORY_CARE_PROVIDER_SITE_OTHER): Payer: Self-pay | Admitting: Family Medicine

## 2020-01-19 ENCOUNTER — Other Ambulatory Visit: Payer: Self-pay

## 2020-01-19 VITALS — BP 152/83 | HR 64 | Temp 98.1°F | Ht 68.0 in | Wt 220.0 lb

## 2020-01-19 DIAGNOSIS — E669 Obesity, unspecified: Secondary | ICD-10-CM

## 2020-01-19 DIAGNOSIS — Z6833 Body mass index (BMI) 33.0-33.9, adult: Secondary | ICD-10-CM

## 2020-01-19 DIAGNOSIS — I10 Essential (primary) hypertension: Secondary | ICD-10-CM | POA: Diagnosis not present

## 2020-01-19 DIAGNOSIS — E559 Vitamin D deficiency, unspecified: Secondary | ICD-10-CM

## 2020-01-19 MED ORDER — VERAPAMIL HCL ER 180 MG PO TBCR: 180.0000 mg | EXTENDED_RELEASE_TABLET | Freq: Two times a day (BID) | ORAL | 0 refills | Status: DC

## 2020-01-19 MED ORDER — VERAPAMIL HCL ER 120 MG PO TBCR: 120.0000 mg | EXTENDED_RELEASE_TABLET | Freq: Two times a day (BID) | ORAL | 0 refills | Status: DC

## 2020-01-19 NOTE — Progress Notes (Signed)
Chief Complaint:   OBESITY Doris Harris is here to discuss her progress with her obesity treatment plan along with follow-up of her obesity related diagnoses. Merina is on the Category 1 Plan or the Vegetarian Plan and states she is following her eating plan approximately 100% of the time. Rosia states she is doing weights and on the elliptical for 60-120 minutes 5 times per week.  Today's visit was #: 13 Starting weight: 234 lbs Starting date: 06/01/2019 Today's weight: 220 lbs Today's date: 01/19/2020 Total lbs lost to date: 14 Total lbs lost since last in-office visit: 0  Interim History: [name] is lifting weights for 30 minutes 5 times per week as well as 30 minutes of cardio. She is adhering to the plan very well. She notes some hunger and cravings at bedtime. Her muscle mass has increased by 1 lb per bioimpedance and she is up 1 lbs today.  Subjective:   1. Essential hypertension Doris Harris's blood pressure is not well controlled. She is on Benicar 40 mg, verapamil 120 mg BID, and metoprolol XL 100 mg. She does not like to be on HCTZ due to urinary frequency. Her pulse today is 64.   BP Readings from Last 3 Encounters:  01/19/20 (!) 152/83  12/29/19 139/81  12/28/19 (!) 148/75   Lab Results  Component Value Date   CREATININE 0.81 11/17/2019   CREATININE 0.83 06/01/2019   CREATININE 0.99 02/23/2019   2. Vitamin D deficiency [name]'s last Vit D level was low at 39.5 She is on prescription Vit D.  Assessment/Plan:   1. Essential hypertension  Doris Harris agreed to increase Verapamil to 180 mg BID #180 and we will refill for 90 days, with no refill.  2. Vitamin D deficiency Doris Harris agreed to continue taking prescription Vitamin D 50,000 IU every week. over-replacement.  3. Class 1 obesity with serious comorbidity and body mass index (BMI) of 33.0 to 33.9 in adult, unspecified obesity type Genecis is currently in the action stage of change. As such, her goal is to continue with weight loss  efforts. She has agreed to the Category 1 Plan, or the Category 2 Plan, or the Vegetarian Plan.   Doris Harris may have herbal tea, yogurt, orfruit as a bedtime snack.  Exercise goals: Doris Harris is to decrease resistance training to 3 times per week and continue cardio 5 times per week.  Behavioral modification strategies: planning for success.  Doris Harris has agreed to follow-up with our clinic in 3 weeks.  Objective:   Blood pressure (!) 152/83, pulse 64, temperature 98.1 F (36.7 C), height 5\' 8"  (1.727 m), weight 220 lb (99.8 kg), SpO2 100 %. Body mass index is 33.45 kg/m.  General: Cooperative, alert, well developed, in no acute distress. HEENT: Conjunctivae and lids unremarkable. Cardiovascular: Regular rhythm.  Lungs: Normal work of breathing. Neurologic: No focal deficits.   Lab Results  Component Value Date   CREATININE 0.81 11/17/2019   BUN 18 11/17/2019   NA 139 11/17/2019   K 4.2 11/17/2019   CL 102 11/17/2019   CO2 22 11/17/2019   Lab Results  Component Value Date   ALT 17 11/17/2019   AST 19 11/17/2019   ALKPHOS 112 11/17/2019   BILITOT 0.4 11/17/2019   Lab Results  Component Value Date   HGBA1C 5.5 11/17/2019   HGBA1C 5.6 06/01/2019   HGBA1C 5.7 (H) 01/12/2019   HGBA1C 5.7 (H) 02/11/2018   HGBA1C 5.3 10/08/2017   Lab Results  Component Value Date   INSULIN 6.6  11/17/2019   INSULIN 8.7 06/01/2019   Lab Results  Component Value Date   TSH 1.460 06/01/2019   Lab Results  Component Value Date   CHOL 229 (H) 11/17/2019   HDL 83 11/17/2019   LDLCALC 134 (H) 11/17/2019   LDLDIRECT 120.2 09/05/2012   TRIG 72 11/17/2019   CHOLHDL 2.8 01/12/2019   Lab Results  Component Value Date   WBC 6.1 06/01/2019   HGB 14.3 06/01/2019   HCT 42.4 06/01/2019   MCV 92 06/01/2019   PLT 186 06/01/2019   No results found for: IRON, TIBC, FERRITIN  Obesity Behavioral Intervention:   Approximately 15 minutes were spent on the discussion below.  ASK: We discussed the  diagnosis of obesity with Doris Harris today and Doris Harris agreed to give Doris Harris permission to discuss obesity behavioral modification therapy today.  ASSESS: Doris Harris has the diagnosis of obesity and her BMI today is 33.46. Doris Harris is in the action stage of change.   ADVISE: Doris Harris was educated on the multiple health risks of obesity as well as the benefit of weight loss to improve her health. She was advised of the need for long term treatment and the importance of lifestyle modifications to improve her current health and to decrease her risk of future health problems.  AGREE: Multiple dietary modification options and treatment options were discussed and Doris Harris agreed to follow the recommendations documented in the above note.  ARRANGE: Doris Harris was educated on the importance of frequent visits to treat obesity as outlined per CMS and USPSTF guidelines and agreed to schedule her next follow up appointment today.  Attestation Statements:   Reviewed by clinician on day of visit: allergies, medications, problem list, medical history, surgical history, family history, social history, and previous encounter notes.   Trude Mcburney, am acting as Energy manager for [place], FNP-C.  I have reviewed the above documentation for accuracy and completeness, and I agree with the above. -  Jesse Sans, FNP

## 2020-01-20 ENCOUNTER — Encounter (INDEPENDENT_AMBULATORY_CARE_PROVIDER_SITE_OTHER): Payer: Self-pay | Admitting: Family Medicine

## 2020-02-09 ENCOUNTER — Ambulatory Visit (INDEPENDENT_AMBULATORY_CARE_PROVIDER_SITE_OTHER): Payer: Medicare Other | Admitting: Family Medicine

## 2020-02-09 ENCOUNTER — Encounter (INDEPENDENT_AMBULATORY_CARE_PROVIDER_SITE_OTHER): Payer: Self-pay | Admitting: Family Medicine

## 2020-02-09 ENCOUNTER — Other Ambulatory Visit: Payer: Self-pay

## 2020-02-09 VITALS — BP 132/76 | HR 65 | Temp 98.2°F | Ht 68.0 in | Wt 221.0 lb

## 2020-02-09 DIAGNOSIS — E669 Obesity, unspecified: Secondary | ICD-10-CM

## 2020-02-09 DIAGNOSIS — I10 Essential (primary) hypertension: Secondary | ICD-10-CM | POA: Diagnosis not present

## 2020-02-09 DIAGNOSIS — E559 Vitamin D deficiency, unspecified: Secondary | ICD-10-CM | POA: Diagnosis not present

## 2020-02-09 DIAGNOSIS — Z6833 Body mass index (BMI) 33.0-33.9, adult: Secondary | ICD-10-CM | POA: Diagnosis not present

## 2020-02-09 MED ORDER — VITAMIN D (ERGOCALCIFEROL) 1.25 MG (50000 UNIT) PO CAPS: 50000.0000 [IU] | ORAL_CAPSULE | ORAL | 0 refills | Status: DC

## 2020-02-09 NOTE — Progress Notes (Signed)
Chief Complaint:   OBESITY Doris Harris is here to discuss her progress with her obesity treatment plan along with follow-up of her obesity related diagnoses. Doris Harris is on the Category 1 Plan, the Category 2 Plan or the Vegetarian Plan and states she is following her eating plan approximately 99% of the time. Doris Harris states she is doing cardio ([employer], and walking for 60-120 minutes 4-5 times per week.  Today's visit was #: 14 Starting weight: 234 lbs Starting date: 06/01/2019 Today's weight: 221 lbs Today's date: 02/09/2020 Total lbs lost to date: 13 Total lbs lost since last in-office visit: 0 (+1)  Interim History: Doris Harris has had 2 deaths in the family recently. However she is adhering to the plan very well. Her weight is plateaued since June 2021. She has added resistance training 2 times per week. She has also added extra walking. She notes she will not have a big Thanksgiving gathering.  Subjective:   1. Essential hypertension Embree's blood pressure is better controlled with increased dose of verapamil. She is on verapamil 180 mg BID, metoprolol XL 100 mg q daily, and olmesartan 40 mg q daily. Cardiovascular ROS: no chest pain or dyspnea on exertion.  BP Readings from Last 3 Encounters:  02/09/20 132/76  01/19/20 (!) 152/83  12/29/19 139/81   Lab Results  Component Value Date   CREATININE 0.81 11/17/2019   CREATININE 0.83 06/01/2019   CREATININE 0.99 02/23/2019   2. Vitamin D deficiency Doris Harris's last Vit D level is low at 39.5. She is on weekly prescription Vit D.   Assessment/Plan:   1. Essential hypertension Doris Harris will continue all her medications at current doses.  2. Vitamin D deficiency  We will refill prescription Vitamin D for 90 days with no refills. Doris Harris will follow-up for routine testing of Vitamin D, at least 2-3 times per year to avoid over-replacement.  - Vitamin D, Ergocalciferol, (DRISDOL) 1.25 MG (50000 UNIT) CAPS capsule; Take 1 capsule (50,000 Units total)  by mouth every 7 (seven) days.  Dispense: 12 capsule; Refill: 0  3. Class 1 obesity with serious comorbidity and body mass index (BMI) of 33.0 to 33.9 in adult, unspecified obesity type Doris Harris is currently in the action stage of change. As such, her goal is to continue with weight loss efforts. She has agreed to the Category 1 Plan.   We will switch Doris Harris to Category 1 for more calorie deficit.  Exercise goals: As is.  Behavioral modification strategies: decreasing simple carbohydrates and holiday eating strategies .  Doris Harris has agreed to follow-up with our clinic in 3 weeks.   Objective:   Blood pressure 132/76, pulse 65, temperature 98.2 F (36.8 C), height 5\' 8"  (1.727 m), weight 221 lb (100.2 kg), SpO2 98 %. Body mass index is 33.6 kg/m.  General: Cooperative, alert, well developed, in no acute distress. HEENT: Conjunctivae and lids unremarkable. Cardiovascular: Regular rhythm.  Lungs: Normal work of breathing. Neurologic: No focal deficits.   Lab Results  Component Value Date   CREATININE 0.81 11/17/2019   BUN 18 11/17/2019   NA 139 11/17/2019   K 4.2 11/17/2019   CL 102 11/17/2019   CO2 22 11/17/2019   Lab Results  Component Value Date   ALT 17 11/17/2019   AST 19 11/17/2019   ALKPHOS 112 11/17/2019   BILITOT 0.4 11/17/2019   Lab Results  Component Value Date   HGBA1C 5.5 11/17/2019   HGBA1C 5.6 06/01/2019   HGBA1C 5.7 (H) 01/12/2019   HGBA1C 5.7 (  H) 02/11/2018   HGBA1C 5.3 10/08/2017   Lab Results  Component Value Date   INSULIN 6.6 11/17/2019   INSULIN 8.7 06/01/2019   Lab Results  Component Value Date   TSH 1.460 06/01/2019   Lab Results  Component Value Date   CHOL 229 (H) 11/17/2019   HDL 83 11/17/2019   LDLCALC 134 (H) 11/17/2019   LDLDIRECT 120.2 09/05/2012   TRIG 72 11/17/2019   CHOLHDL 2.8 01/12/2019   Lab Results  Component Value Date   WBC 6.1 06/01/2019   HGB 14.3 06/01/2019   HCT 42.4 06/01/2019   MCV 92 06/01/2019   PLT 186  06/01/2019   No results found for: IRON, TIBC, FERRITIN  Obesity Behavioral Intervention:   Approximately 15 minutes were spent on the discussion below.  ASK: We discussed the diagnosis of obesity with Doris Harris today and Doris Harris agreed to give [name] permission to discuss obesity behavioral modification therapy today.  ASSESS: Doris Harris has the diagnosis of obesity and her BMI today is 33.61. Doris Harris is in the action stage of change.   ADVISE: Doris Harris was educated on the multiple health risks of obesity as well as the benefit of weight loss to improve her health. She was advised of the need for long term treatment and the importance of lifestyle modifications to improve her current health and to decrease her risk of future health problems.  AGREE: Multiple dietary modification options and treatment options were discussed and Doris Harris agreed to follow the recommendations documented in the above note.  ARRANGE: Doris Harris was educated on the importance of frequent visits to treat obesity as outlined per CMS and USPSTF guidelines and agreed to schedule her next follow up appointment today.  Attestation Statements:   Reviewed by clinician on day of visit: allergies, medications, problem list, medical history, surgical history, family history, social history, and previous encounter notes.   Trude Mcburney, am acting as Energy manager for [place], FNP-C.  I have reviewed the above documentation for accuracy and completeness, and I agree with the above. -  Jesse Sans, FNP

## 2020-02-10 ENCOUNTER — Encounter (INDEPENDENT_AMBULATORY_CARE_PROVIDER_SITE_OTHER): Payer: Self-pay | Admitting: Family Medicine

## 2020-03-01 ENCOUNTER — Encounter (INDEPENDENT_AMBULATORY_CARE_PROVIDER_SITE_OTHER): Payer: Self-pay | Admitting: Family Medicine

## 2020-03-01 ENCOUNTER — Other Ambulatory Visit: Payer: Self-pay

## 2020-03-01 ENCOUNTER — Ambulatory Visit (INDEPENDENT_AMBULATORY_CARE_PROVIDER_SITE_OTHER): Payer: Medicare Other | Admitting: Family Medicine

## 2020-03-01 VITALS — BP 148/79 | HR 66 | Temp 97.7°F | Ht 68.0 in | Wt 223.0 lb

## 2020-03-01 DIAGNOSIS — I10 Essential (primary) hypertension: Secondary | ICD-10-CM | POA: Diagnosis not present

## 2020-03-01 DIAGNOSIS — E669 Obesity, unspecified: Secondary | ICD-10-CM

## 2020-03-01 DIAGNOSIS — Z6833 Body mass index (BMI) 33.0-33.9, adult: Secondary | ICD-10-CM

## 2020-03-02 ENCOUNTER — Encounter (INDEPENDENT_AMBULATORY_CARE_PROVIDER_SITE_OTHER): Payer: Self-pay | Admitting: Family Medicine

## 2020-03-02 NOTE — Progress Notes (Signed)
Chief Complaint:   OBESITY Doris Harris is here to discuss her progress with her obesity treatment plan along with follow-up of her obesity related diagnoses. Hopelynn is on the Category 1 Plan and states she is following her eating plan approximately 99% of the time. Emogene states she is doing cardio and weights for 60-120 minutes 2 times per week.  Today's visit was #: 15 Starting weight: 234 lbs Starting date: 06/01/2019 Today's weight: 223 lbs Today's date: 03/01/2020 Total lbs lost to date: 11 Total lbs lost since last in-office visit: 0  Interim History: Panayiota is adhering to the plan well at breakfast and lunch. She does go over on extra calories at times. She gets hungry at night. She eats supper at 4 pm. Her weight has been plateaued since May 2021.   Subjective:   1. Essential hypertension Shonya feels her blood pressure is up due to traffic on the way into the clinic. She is compliant with her medications. Her blood pressure at home runs at 130's/70's. She is on Benicar, Calan-SR, and Toprol-XL. Cardiovascular ROS: no chest pain or dyspnea on exertion.  BP Readings from Last 3 Encounters:  03/01/20 (!) 148/79  02/09/20 132/76  01/19/20 (!) 152/83   Lab Results  Component Value Date   CREATININE 0.81 11/17/2019   CREATININE 0.83 06/01/2019   CREATININE 0.99 02/23/2019   Assessment/Plan:   1. Essential hypertension Lila will continue all her medications, and will continue working on healthy weight loss and exercise to improve blood pressure control.  2. Class 1 obesity with serious comorbidity and body mass index (BMI) of 33.0 to 33.9 in adult, unspecified obesity type [place] is currently in the action stage of change. As such, her goal is to continue with weight loss efforts. She has agreed to the Category 1 Plan or keeping a food journal and adhering to recommended goals of 1050-1150 calories and 75 grams of protein daily.   Persis will try to make dinner later in the  day.  We will recheck fasting labs and IC at her next office visit.  Exercise goals: As is.  Behavioral modification strategies: increasing lean protein intake, meal planning and cooking strategies and better snacking choices.  Ahmani has agreed to follow-up with our clinic in 3 weeks.   Objective:   Blood pressure (!) 148/79, pulse 66, temperature 97.7 F (36.5 C), height 5\' 8"  (1.727 m), weight 223 lb (101.2 kg), SpO2 97 %. Body mass index is 33.91 kg/m.  General: Cooperative, alert, well developed, in no acute distress. HEENT: Conjunctivae and lids unremarkable. Cardiovascular: Regular rhythm.  Lungs: Normal work of breathing. Neurologic: No focal deficits.   Lab Results  Component Value Date   CREATININE 0.81 11/17/2019   BUN 18 11/17/2019   NA 139 11/17/2019   K 4.2 11/17/2019   CL 102 11/17/2019   CO2 22 11/17/2019   Lab Results  Component Value Date   ALT 17 11/17/2019   AST 19 11/17/2019   ALKPHOS 112 11/17/2019   BILITOT 0.4 11/17/2019   Lab Results  Component Value Date   HGBA1C 5.5 11/17/2019   HGBA1C 5.6 06/01/2019   HGBA1C 5.7 (H) 01/12/2019   HGBA1C 5.7 (H) 02/11/2018   HGBA1C 5.3 10/08/2017   Lab Results  Component Value Date   INSULIN 6.6 11/17/2019   INSULIN 8.7 06/01/2019   Lab Results  Component Value Date   TSH 1.460 06/01/2019   Lab Results  Component Value Date   CHOL 229 (H)  11/17/2019   HDL 83 11/17/2019   LDLCALC 134 (H) 11/17/2019   LDLDIRECT 120.2 09/05/2012   TRIG 72 11/17/2019   CHOLHDL 2.8 01/12/2019   Lab Results  Component Value Date   WBC 6.1 06/01/2019   HGB 14.3 06/01/2019   HCT 42.4 06/01/2019   MCV 92 06/01/2019   PLT 186 06/01/2019   No results found for: IRON, TIBC, FERRITIN  Obesity Behavioral Intervention:   Approximately 15 minutes were spent on the discussion below.  ASK: We discussed the diagnosis of obesity with Debraann today and Tailynn agreed to give [name] permission to discuss obesity behavioral  modification therapy today.  ASSESS: Jessikah has the diagnosis of obesity and her BMI today is 33.91. Marrion is in the action stage of change.   ADVISE: Chelcie was educated on the multiple health risks of obesity as well as the benefit of weight loss to improve her health. She was advised of the need for long term treatment and the importance of lifestyle modifications to improve her current health and to decrease her risk of future health problems.  AGREE: Multiple dietary modification options and treatment options were discussed and Rayleigh agreed to follow the recommendations documented in the above note.  ARRANGE: Lynise was educated on the importance of frequent visits to treat obesity as outlined per CMS and USPSTF guidelines and agreed to schedule her next follow up appointment today.  Attestation Statements:   Reviewed by clinician on day of visit: allergies, medications, problem list, medical history, surgical history, family history, social history, and previous encounter notes.   Trude Mcburney, am acting as Energy manager for [place], FNP-C.  I have reviewed the above documentation for accuracy and completeness, and I agree with the above. -  Jesse Sans, FNP

## 2020-03-21 ENCOUNTER — Other Ambulatory Visit (INDEPENDENT_AMBULATORY_CARE_PROVIDER_SITE_OTHER): Payer: Self-pay | Admitting: Family Medicine

## 2020-03-21 DIAGNOSIS — I10 Essential (primary) hypertension: Secondary | ICD-10-CM

## 2020-03-21 NOTE — Telephone Encounter (Signed)
This patient was last seen by Dawn Whitmire, FNP, and currently has an upcoming appt scheduled on 03/22/20 with her.  

## 2020-03-22 ENCOUNTER — Ambulatory Visit (INDEPENDENT_AMBULATORY_CARE_PROVIDER_SITE_OTHER): Payer: Medicare Other | Admitting: Family Medicine

## 2020-03-22 ENCOUNTER — Other Ambulatory Visit: Payer: Self-pay

## 2020-03-22 ENCOUNTER — Encounter (INDEPENDENT_AMBULATORY_CARE_PROVIDER_SITE_OTHER): Payer: Self-pay | Admitting: Family Medicine

## 2020-03-22 VITALS — BP 152/81 | HR 63 | Temp 98.0°F | Ht 68.0 in | Wt 219.0 lb

## 2020-03-22 DIAGNOSIS — E559 Vitamin D deficiency, unspecified: Secondary | ICD-10-CM | POA: Diagnosis not present

## 2020-03-22 DIAGNOSIS — E7849 Other hyperlipidemia: Secondary | ICD-10-CM

## 2020-03-22 DIAGNOSIS — Z6833 Body mass index (BMI) 33.0-33.9, adult: Secondary | ICD-10-CM

## 2020-03-22 DIAGNOSIS — E669 Obesity, unspecified: Secondary | ICD-10-CM | POA: Diagnosis not present

## 2020-03-22 DIAGNOSIS — R0602 Shortness of breath: Secondary | ICD-10-CM

## 2020-03-22 DIAGNOSIS — R7303 Prediabetes: Secondary | ICD-10-CM | POA: Diagnosis not present

## 2020-03-22 DIAGNOSIS — E8881 Metabolic syndrome: Secondary | ICD-10-CM | POA: Diagnosis not present

## 2020-03-22 MED ORDER — VITAMIN D (ERGOCALCIFEROL) 1.25 MG (50000 UNIT) PO CAPS: 50000.0000 [IU] | ORAL_CAPSULE | ORAL | 0 refills | Status: DC

## 2020-03-22 NOTE — Progress Notes (Signed)
Chief Complaint:   OBESITY Doris Harris is here to discuss her progress with her obesity treatment plan along with follow-up of her obesity related diagnoses. Lucia is on the Category 1 Plan or keeping a food journal and adhering to recommended goals of 1050-1150 calories and 75 grams of protein daily and states she is following her eating plan approximately 99% of the time. Deztinee states she is doing cardio for 60-120 minutes 3-4 times per week.  Today's visit was #: 16 Starting weight: 234 lbs Starting date: 06/01/2019 Today's weight: 219 lbs Today's date: 03/22/2020 Total lbs lost to date: 15 Total lbs lost since last in-office visit: 4  Interim History: Anntoinette's IC was done today, and her RMR was 1351, which is down 186 kcal from 1537. We will continue with Category 1. She says she did not overeat over the holidays, and she is down 4 lbs. She journals consistently.  Subjective:   1. SOB (shortness of breath) on exertion Lorieann has shortness of breath with exertion.  2. Vitamin D deficiency Melissa's Vit D level was low at 39.5. She is on prescription Vit D.  3. Insulin resistance Kailoni's last A1c was 5.5, previously pre-diabetic at 5.7. She is not on metformin.   Lab Results  Component Value Date   INSULIN 6.6 11/17/2019   INSULIN 8.7 06/01/2019   Lab Results  Component Value Date   HGBA1C 5.5 11/17/2019   4. Other hyperlipidemia Devanshi's last LDL was 134, HDL was 83, and triglycerides is 72. She is not on statin.   Lab Results  Component Value Date   ALT 17 11/17/2019   AST 19 11/17/2019   ALKPHOS 112 11/17/2019   BILITOT 0.4 11/17/2019   Lab Results  Component Value Date   CHOL 229 (H) 11/17/2019   HDL 83 11/17/2019   LDLCALC 134 (H) 11/17/2019   LDLDIRECT 120.2 09/05/2012   TRIG 72 11/17/2019   CHOLHDL 2.8 01/12/2019   Assessment/Plan:   1. SOB (shortness of breath) on exertion Yarissa's IC was repeated today. She will continue to follow up as directed.  2.  Vitamin D deficiency  We will check labs today, and we will refill prescription Vitamin D for 1 month. Caydence will follow-up for routine testing of Vitamin D, at least 2-3 times per year to avoid over-replacement.  - VITAMIN D 25 Hydroxy (Vit-D Deficiency, Fractures) - Vitamin D, Ergocalciferol, (DRISDOL) 1.25 MG (50000 UNIT) CAPS capsule; Take 1 capsule (50,000 Units total) by mouth every 7 (seven) days.  Dispense: 12 capsule; Refill: 0  3. Insulin resistance Pacey will continue to work on weight loss, exercise, and decreasing simple carbohydrates to help decrease the risk of diabetes. We will check labs today. Dhanvi agreed to follow-up with [name] as directed to closely monitor her progress.  - Comprehensive metabolic panel - Hemoglobin A1c - Insulin, random  4. Other hyperlipidemia . We discussed several lifestyle modifications today and Mardie will continue to work on diet, exercise and weight loss efforts. We will check labs today.  - Lipid Panel With LDL/HDL Ratio  5. Class 1 obesity with serious comorbidity and body mass index (BMI) of 33.0 to 33.9 in adult, unspecified obesity type Taren is currently in the action stage of change. As such, her goal is to continue with weight loss efforts. She has agreed to the Category 1 Plan or keeping a food journal and adhering to recommended goals of 1050-1150 calories and 75 grams of protein daily.   Exercise goals: As  is.  Behavioral modification strategies: increasing lean protein intake.  Ayahna has agreed to follow-up with our clinic in 3 weeks.  Keamber was informed we would discuss her lab results at her next visit unless there is a critical issue that needs to be addressed sooner. Howard agreed to keep her next visit at the agreed upon time to discuss these results.  Objective:   Blood pressure (!) 152/81, pulse 63, temperature 98 F (36.7 C), height 5\' 8"  (1.727 m), weight 219 lb (99.3 kg), SpO2 100 %. Body mass index is 33.3  kg/m.  General: Cooperative, alert, well developed, in no acute distress. HEENT: Conjunctivae and lids unremarkable. Cardiovascular: Regular rhythm.  Lungs: Normal work of breathing. Neurologic: No focal deficits.   Lab Results  Component Value Date   CREATININE 0.81 11/17/2019   BUN 18 11/17/2019   NA 139 11/17/2019   K 4.2 11/17/2019   CL 102 11/17/2019   CO2 22 11/17/2019   Lab Results  Component Value Date   ALT 17 11/17/2019   AST 19 11/17/2019   ALKPHOS 112 11/17/2019   BILITOT 0.4 11/17/2019   Lab Results  Component Value Date   HGBA1C 5.5 11/17/2019   HGBA1C 5.6 06/01/2019   HGBA1C 5.7 (H) 01/12/2019   HGBA1C 5.7 (H) 02/11/2018   HGBA1C 5.3 10/08/2017   Lab Results  Component Value Date   INSULIN 6.6 11/17/2019   INSULIN 8.7 06/01/2019   Lab Results  Component Value Date   TSH 1.460 06/01/2019   Lab Results  Component Value Date   CHOL 229 (H) 11/17/2019   HDL 83 11/17/2019   LDLCALC 134 (H) 11/17/2019   LDLDIRECT 120.2 09/05/2012   TRIG 72 11/17/2019   CHOLHDL 2.8 01/12/2019   Lab Results  Component Value Date   WBC 6.1 06/01/2019   HGB 14.3 06/01/2019   HCT 42.4 06/01/2019   MCV 92 06/01/2019   PLT 186 06/01/2019   No results found for: IRON, TIBC, FERRITIN  Obesity Behavioral Intervention:   Approximately 15 minutes were spent on the discussion below.  ASK: We discussed the diagnosis of obesity with Lashonne today and Valisa agreed to give 06/03/2019 permission to discuss obesity behavioral modification therapy today.  ASSESS: Zaylia has the diagnosis of obesity and her BMI today is 33.31. Victoire is in the action stage of change.   ADVISE: Lujean was educated on the multiple health risks of obesity as well as the benefit of weight loss to improve her health. She was advised of the need for long term treatment and the importance of lifestyle modifications to improve her current health and to decrease her risk of future health  problems.  AGREE: Multiple dietary modification options and treatment options were discussed and Shauntell agreed to follow the recommendations documented in the above note.  ARRANGE: Aloha was educated on the importance of frequent visits to treat obesity as outlined per CMS and USPSTF guidelines and agreed to schedule her next follow up appointment today.  Attestation Statements:   Reviewed by clinician on day of visit: allergies, medications, problem list, medical history, surgical history, family history, social history, and previous encounter notes.   Manson Allan, am acting as Trude Mcburney for Energy manager, FNP-C.  I have reviewed the above documentation for accuracy and completeness, and I agree with the above. -  [place], FNP

## 2020-03-23 LAB — LIPID PANEL WITH LDL/HDL RATIO
Cholesterol, Total: 229 mg/dL — ABNORMAL HIGH (ref 100–199)
HDL: 84 mg/dL (ref >39)
LDL Chol Calc (NIH): 134 mg/dL — ABNORMAL HIGH (ref 0–99)
LDL/HDL Ratio: 1.6 ratio (ref 0.0–3.2)
Triglycerides: 63 mg/dL (ref 0–149)
VLDL Cholesterol Cal: 11 mg/dL (ref 5–40)

## 2020-03-23 LAB — COMPREHENSIVE METABOLIC PANEL
ALT: 20 IU/L (ref 0–32)
AST: 20 IU/L (ref 0–40)
Albumin/Globulin Ratio: 1 — ABNORMAL LOW (ref 1.2–2.2)
Albumin: 3.9 g/dL (ref 3.7–4.7)
Alkaline Phosphatase: 110 IU/L (ref 44–121)
BUN/Creatinine Ratio: 20 (ref 12–28)
BUN: 17 mg/dL (ref 8–27)
Bilirubin Total: 0.6 mg/dL (ref 0.0–1.2)
CO2: 24 mmol/L (ref 20–29)
Calcium: 9.3 mg/dL (ref 8.7–10.3)
Chloride: 103 mmol/L (ref 96–106)
Creatinine, Ser: 0.84 mg/dL (ref 0.57–1.00)
GFR calc Af Amer: 80 mL/min/{1.73_m2} (ref >59)
GFR calc non Af Amer: 69 mL/min/{1.73_m2} (ref >59)
Globulin, Total: 3.9 g/dL (ref 1.5–4.5)
Glucose: 93 mg/dL (ref 65–99)
Potassium: 4.4 mmol/L (ref 3.5–5.2)
Sodium: 140 mmol/L (ref 134–144)
Total Protein: 7.8 g/dL (ref 6.0–8.5)

## 2020-03-23 LAB — HEMOGLOBIN A1C
Est. average glucose Bld gHb Est-mCnc: 111 mg/dL
Hgb A1c MFr Bld: 5.5 % (ref 4.8–5.6)

## 2020-03-23 LAB — INSULIN, RANDOM: INSULIN: 5.3 u[IU]/mL (ref 2.6–24.9)

## 2020-03-23 LAB — VITAMIN D 25 HYDROXY (VIT D DEFICIENCY, FRACTURES): Vit D, 25-Hydroxy: 62.7 ng/mL (ref 30.0–100.0)

## 2020-04-07 ENCOUNTER — Other Ambulatory Visit (INDEPENDENT_AMBULATORY_CARE_PROVIDER_SITE_OTHER): Payer: Self-pay | Admitting: Family Medicine

## 2020-04-07 DIAGNOSIS — I10 Essential (primary) hypertension: Secondary | ICD-10-CM

## 2020-04-13 ENCOUNTER — Encounter (INDEPENDENT_AMBULATORY_CARE_PROVIDER_SITE_OTHER): Payer: Self-pay | Admitting: Family Medicine

## 2020-04-13 ENCOUNTER — Other Ambulatory Visit: Payer: Self-pay

## 2020-04-13 ENCOUNTER — Ambulatory Visit (INDEPENDENT_AMBULATORY_CARE_PROVIDER_SITE_OTHER): Payer: Medicare Other | Admitting: Family Medicine

## 2020-04-13 VITALS — BP 138/77 | HR 65 | Temp 98.0°F | Ht 68.0 in | Wt 220.0 lb

## 2020-04-13 DIAGNOSIS — E7849 Other hyperlipidemia: Secondary | ICD-10-CM | POA: Diagnosis not present

## 2020-04-13 DIAGNOSIS — E669 Obesity, unspecified: Secondary | ICD-10-CM

## 2020-04-13 DIAGNOSIS — I10 Essential (primary) hypertension: Secondary | ICD-10-CM

## 2020-04-13 DIAGNOSIS — Z6833 Body mass index (BMI) 33.0-33.9, adult: Secondary | ICD-10-CM

## 2020-04-13 DIAGNOSIS — E66811 Obesity, class 1: Secondary | ICD-10-CM

## 2020-04-13 MED ORDER — VERAPAMIL HCL ER 180 MG PO TBCR: 180.0000 mg | EXTENDED_RELEASE_TABLET | Freq: Two times a day (BID) | ORAL | 0 refills | Status: DC

## 2020-04-13 NOTE — Progress Notes (Signed)
The 10-year ASCVD risk score Denman George DC Montez Hageman., et al., 2013) is: 19.6%   Values used to calculate the score:     Age: 75 years     Sex: Female     Is Non-Hispanic African American: Yes     Diabetic: No     Tobacco smoker: No     Systolic Blood Pressure: 138 mmHg     Is BP treated: Yes     HDL Cholesterol: 84 mg/dL     Total Cholesterol: 229 mg/dL

## 2020-04-18 NOTE — Progress Notes (Signed)
Chief Complaint:   OBESITY Briele is here to discuss her progress with her obesity treatment plan along with follow-up of her obesity related diagnoses. Abeera is on keeping a food journal and adhering to recommended goals of 1050-1150  calories and 75 grams  protein and states she is following her eating plan approximately 90% of the time. Pyper states she is biking and lifting weights 60 minutes 3 times per week.  Today's visit was #: 17 Starting weight: 234 lbs Starting date: 06/01/2019 Today's weight: 220 lbs Today's date: 04/13/2020 Total lbs lost to date: 14 lbs Total lbs lost since last in-office visit: 0 (+1)  Interim History: Arieonna journaled over Christmas and calories averaged between 1100-1200 calories. Protein averaged 80 grams daily. She has started eating dinner earlier which reduces snacking later at night. She is following Cat 1 plan and journaling. Hunger is mostly satisfied. I am unsure why she is not losing weight if her journaling is accurate.   Subjective:   1. Essential hypertension  Alayzia's blood pressure is well controlled with Metoprolol, olmesartan and verapamil. Devita's goal to get off her blood pressure medication.  BP Readings from Last 3 Encounters:  04/13/20 138/77  03/22/20 (!) 152/81  03/01/20 (!) 148/79     2. Other hyperlipidemia  Lyna's LDL is 134 and HDL is 84. ASCVD risk is 19.6% which we discussed.  Atorvastatin caused body aches. I recommended trying Crestor even a few days per week.   Lab Results  Component Value Date   ALT 20 03/22/2020   AST 20 03/22/2020   ALKPHOS 110 03/22/2020   BILITOT 0.6 03/22/2020   Lab Results  Component Value Date   CHOL 229 (H) 03/22/2020   HDL 84 03/22/2020   LDLCALC 134 (H) 03/22/2020   LDLDIRECT 120.2 09/05/2012   TRIG 63 03/22/2020   CHOLHDL 2.8 01/12/2019   The 10-year ASCVD risk score Denman George DC Jr., et al., 2013) is: 19.6%   Values used to calculate the score:     Age: 31 years     Sex:  Female     Is Non-Hispanic African American: Yes     Diabetic: No     Tobacco smoker: No     Systolic Blood Pressure: 138 mmHg     Is BP treated: Yes     HDL Cholesterol: 84 mg/dL     Total Cholesterol: 229 mg/dL   Assessment/Plan:   1. Essential hypertension Christeena will continue all medications.  - verapamil (CALAN-SR) 180 MG CR tablet; Take 1 tablet (180 mg total) by mouth 2 (two) times daily.  Dispense: 180 tablet; Refill: 0  2. Other hyperlipidemia She declined Crestor. She will continue to work on wt loss and exercise.   3. Class 1 obesity with serious comorbidity and body mass index (BMI) of 33.0 to 33.9 in adult, unspecified obesity type  Anays is currently in the action stage of change. As such, her goal is to continue with weight loss efforts. She has agreed to the Category 1 Plan.   Discussed she will keep extra calories <100.  Exercise goals: As is.  Behavioral modification strategies: increasing lean protein intake, decreasing simple carbohydrates and planning for success.  Anaya has agreed to follow-up with our clinic in 3 weeks.     Objective:   Blood pressure 138/77, pulse 65, temperature 98 F (36.7 C), height 5\' 8"  (1.727 m), weight 220 lb (99.8 kg), SpO2 96 %. Body mass index is 33.45 kg/m.  General:  Cooperative, alert, well developed, in no acute distress. HEENT: Conjunctivae and lids unremarkable. Cardiovascular: Regular rhythm.  Lungs: Normal work of breathing. Neurologic: No focal deficits.   Lab Results  Component Value Date   CREATININE 0.84 03/22/2020   BUN 17 03/22/2020   NA 140 03/22/2020   K 4.4 03/22/2020   CL 103 03/22/2020   CO2 24 03/22/2020   Lab Results  Component Value Date   ALT 20 03/22/2020   AST 20 03/22/2020   ALKPHOS 110 03/22/2020   BILITOT 0.6 03/22/2020   Lab Results  Component Value Date   HGBA1C 5.5 03/22/2020   HGBA1C 5.5 11/17/2019   HGBA1C 5.6 06/01/2019   HGBA1C 5.7 (H) 01/12/2019   HGBA1C 5.7 (H)  02/11/2018   Lab Results  Component Value Date   INSULIN 5.3 03/22/2020   INSULIN 6.6 11/17/2019   INSULIN 8.7 06/01/2019   Lab Results  Component Value Date   TSH 1.460 06/01/2019   Lab Results  Component Value Date   CHOL 229 (H) 03/22/2020   HDL 84 03/22/2020   LDLCALC 134 (H) 03/22/2020   LDLDIRECT 120.2 09/05/2012   TRIG 63 03/22/2020   CHOLHDL 2.8 01/12/2019   Lab Results  Component Value Date   WBC 6.1 06/01/2019   HGB 14.3 06/01/2019   HCT 42.4 06/01/2019   MCV 92 06/01/2019   PLT 186 06/01/2019   No results found for: IRON, TIBC, FERRITIN  Obesity Behavioral Intervention:   Approximately 15 minutes were spent on the discussion below.  ASK: We discussed the diagnosis of obesity with Ambry today and Tamiya agreed to give [name] permission to discuss obesity behavioral modification therapy today.  ASSESS: Jaren has the diagnosis of obesity and her BMI today is 33.5. Deshante is in the action stage of change.   ADVISE: Sonnie was educated on the multiple health risks of obesity as well as the benefit of weight loss to improve her health. She was advised of the need for long term treatment and the importance of lifestyle modifications to improve her current health and to decrease her risk of future health problems.  AGREE: Multiple dietary modification options and treatment options were discussed and Denessa agreed to follow the recommendations documented in the above note.  ARRANGE: Kyli was educated on the importance of frequent visits to treat obesity as outlined per CMS and USPSTF guidelines and agreed to schedule her next follow up appointment today.  Attestation Statements:   Reviewed by clinician on day of visit: allergies, medications, problem list, medical history, surgical history, family history, social history, and previous encounter notes.   IDelorse Limber, am acting as Energy manager for [employer], FNP.  I have reviewed the above  documentation for accuracy and completeness, and I agree with the above. -  Jesse Sans, FNP

## 2020-04-19 ENCOUNTER — Encounter (INDEPENDENT_AMBULATORY_CARE_PROVIDER_SITE_OTHER): Payer: Self-pay | Admitting: Family Medicine

## 2020-04-22 ENCOUNTER — Other Ambulatory Visit: Payer: Self-pay | Admitting: Physician Assistant

## 2020-04-22 DIAGNOSIS — I10 Essential (primary) hypertension: Secondary | ICD-10-CM

## 2020-04-25 ENCOUNTER — Ambulatory Visit: Payer: Medicare Other | Admitting: Physician Assistant

## 2020-05-04 ENCOUNTER — Ambulatory Visit (INDEPENDENT_AMBULATORY_CARE_PROVIDER_SITE_OTHER): Payer: Medicare Other | Admitting: Family Medicine

## 2020-05-04 ENCOUNTER — Encounter (INDEPENDENT_AMBULATORY_CARE_PROVIDER_SITE_OTHER): Payer: Self-pay | Admitting: Family Medicine

## 2020-05-04 ENCOUNTER — Other Ambulatory Visit: Payer: Self-pay

## 2020-05-04 VITALS — BP 143/81 | HR 61 | Temp 98.1°F | Ht 68.0 in | Wt 222.0 lb

## 2020-05-04 DIAGNOSIS — E8881 Metabolic syndrome: Secondary | ICD-10-CM | POA: Diagnosis not present

## 2020-05-04 DIAGNOSIS — I10 Essential (primary) hypertension: Secondary | ICD-10-CM | POA: Diagnosis not present

## 2020-05-04 DIAGNOSIS — E669 Obesity, unspecified: Secondary | ICD-10-CM

## 2020-05-04 DIAGNOSIS — Z6833 Body mass index (BMI) 33.0-33.9, adult: Secondary | ICD-10-CM | POA: Diagnosis not present

## 2020-05-08 NOTE — Progress Notes (Signed)
Chief Complaint:   OBESITY Doris Harris is here to discuss her progress with her obesity treatment plan along with follow-up of her obesity related diagnoses. Doris Harris is on the Category 1 Plan and states she is following her eating plan approximately 95% of the time. Doris Harris states she is doing cardio 30 minutes 3 times per week.  Today's visit was #: 18 Starting weight: 234 lbs Starting date: 06/01/2019 Today's weight: 222 lbs Today's date: 05/04/2020 Total lbs lost to date: 12 lbs Total lbs lost since last in-office visit: 0  Interim History: Doris Harris's weight has been plateaued since last May (8 months). She eats 1100-1200 calories most days but does not always eat the protein she should. Her weight goal is 185 lbs (28 BMI). For breakfast she eats [place] sausage, egg, and toast; lunch is only vegetables; supper is vegetables only sometimes. She does admit to deviating from the plan more than when she first started our program.  She is eating protein quite sporadically and often has meals without any protein.   Subjective:   1. Essential hypertension Doris Harris's hypertension hs not always been well controlled. BP is high today but was at goal last OV. She is in metoprolol, Benicar, and verapamil.  BP Readings from Last 3 Encounters:  05/04/20 (!) 143/81  04/13/20 138/77  03/22/20 (!) 152/81    2. Insulin resistance Doris Harris does note some cravings. She has been pre-diabetic and has family history.  Lab Results  Component Value Date   INSULIN 5.3 03/22/2020   INSULIN 6.6 11/17/2019   INSULIN 8.7 06/01/2019   Lab Results  Component Value Date   HGBA1C 5.5 03/22/2020    Assessment/Plan:   1. Essential hypertension  Continue all meds.  2. Insulin resistance Doris Harris will continue to work on weight loss, exercise, and decreasing simple carbohydrates to help decrease the risk of diabetes.  3. Class 1 obesity with serious comorbidity and body mass index (BMI) of 33.0 to 33.9 in adult,  unspecified obesity type Doris Harris is currently in the action stage of change. As such, her goal is to continue with weight loss efforts. She has agreed to the Category 1 Plan and keeping a food journal and adhering to recommended goals of 1100-1200 calories and 80 g protein.   Exercise goals: As is  We discussed the importance of protein. She will work to increase her protein intake.   Behavioral modification strategies: meal planning and cooking strategies and keeping a strict food journal.  Doris Harris has agreed to follow-up with our clinic in 3 weeks.   Objective:   Blood pressure (!) 143/81, pulse 61, temperature 98.1 F (36.7 C), height 5\' 8"  (1.727 m), weight 222 lb (100.7 kg), SpO2 100 %. Body mass index is 33.75 kg/m.  General: Cooperative, alert, well developed, in no acute distress. HEENT: Conjunctivae and lids unremarkable. Cardiovascular: Regular rhythm.  Lungs: Normal work of breathing. Neurologic: No focal deficits.   Lab Results  Component Value Date   CREATININE 0.84 03/22/2020   BUN 17 03/22/2020   NA 140 03/22/2020   K 4.4 03/22/2020   CL 103 03/22/2020   CO2 24 03/22/2020   Lab Results  Component Value Date   ALT 20 03/22/2020   AST 20 03/22/2020   ALKPHOS 110 03/22/2020   BILITOT 0.6 03/22/2020   Lab Results  Component Value Date   HGBA1C 5.5 03/22/2020   HGBA1C 5.5 11/17/2019   HGBA1C 5.6 06/01/2019   HGBA1C 5.7 (H) 01/12/2019   HGBA1C 5.7 (  H) 02/11/2018   Lab Results  Component Value Date   INSULIN 5.3 03/22/2020   INSULIN 6.6 11/17/2019   INSULIN 8.7 06/01/2019   Lab Results  Component Value Date   TSH 1.460 06/01/2019   Lab Results  Component Value Date   CHOL 229 (H) 03/22/2020   HDL 84 03/22/2020   LDLCALC 134 (H) 03/22/2020   LDLDIRECT 120.2 09/05/2012   TRIG 63 03/22/2020   CHOLHDL 2.8 01/12/2019   Lab Results  Component Value Date   WBC 6.1 06/01/2019   HGB 14.3 06/01/2019   HCT 42.4 06/01/2019   MCV 92 06/01/2019   PLT 186  06/01/2019   No results found for: IRON, TIBC, FERRITIN  Obesity Behavioral Intervention:   Approximately 15 minutes were spent on the discussion below.  ASK: We discussed the diagnosis of obesity with Doris Harris today and Doris Harris agreed to give [name] permission to discuss obesity behavioral modification therapy today.  ASSESS: Doris Harris has the diagnosis of obesity and her BMI today is 33.8. Doris Harris is in the action stage of change.   ADVISE: Doris Harris was educated on the multiple health risks of obesity as well as the benefit of weight loss to improve her health. She was advised of the need for long term treatment and the importance of lifestyle modifications to improve her current health and to decrease her risk of future health problems.  AGREE: Multiple dietary modification options and treatment options were discussed and Doris Harris agreed to follow the recommendations documented in the above note.  ARRANGE: Doris Harris was educated on the importance of frequent visits to treat obesity as outlined per CMS and USPSTF guidelines and agreed to schedule her next follow up appointment today.  Attestation Statements:   Reviewed by clinician on day of visit: allergies, medications, problem list, medical history, surgical history, family history, social history, and previous encounter notes.  Edmund Hilda, am acting as Energy manager for [place], FNP.  I have reviewed the above documentation for accuracy and completeness, and I agree with the above. -  Jesse Sans, FNP

## 2020-05-09 ENCOUNTER — Encounter (INDEPENDENT_AMBULATORY_CARE_PROVIDER_SITE_OTHER): Payer: Self-pay | Admitting: Family Medicine

## 2020-05-09 DIAGNOSIS — E8881 Metabolic syndrome: Secondary | ICD-10-CM | POA: Insufficient documentation

## 2020-05-09 DIAGNOSIS — E88819 Insulin resistance, unspecified: Secondary | ICD-10-CM | POA: Insufficient documentation

## 2020-05-17 ENCOUNTER — Encounter: Payer: Self-pay | Admitting: Physician Assistant

## 2020-05-17 ENCOUNTER — Other Ambulatory Visit: Payer: Self-pay

## 2020-05-17 ENCOUNTER — Ambulatory Visit (INDEPENDENT_AMBULATORY_CARE_PROVIDER_SITE_OTHER): Payer: Medicare Other | Admitting: Physician Assistant

## 2020-05-17 VITALS — BP 148/79 | HR 62 | Temp 97.9°F | Ht 68.0 in | Wt 225.3 lb

## 2020-05-17 DIAGNOSIS — I10 Essential (primary) hypertension: Secondary | ICD-10-CM | POA: Diagnosis not present

## 2020-05-17 DIAGNOSIS — E559 Vitamin D deficiency, unspecified: Secondary | ICD-10-CM

## 2020-05-17 DIAGNOSIS — E669 Obesity, unspecified: Secondary | ICD-10-CM | POA: Diagnosis not present

## 2020-05-17 MED ORDER — METOPROLOL SUCCINATE ER 100 MG PO TB24
ORAL_TABLET | ORAL | 1 refills | Status: DC
Start: 2020-05-17 — End: 2020-11-30

## 2020-05-17 MED ORDER — OLMESARTAN MEDOXOMIL 40 MG PO TABS: 40.0000 mg | ORAL_TABLET | Freq: Every day | ORAL | 1 refills | Status: DC

## 2020-05-17 NOTE — Progress Notes (Signed)
Established Patient Office Visit  Subjective:  Patient ID: Doris Harris, female    DOB: Jan 23, 1946  Age: 75 y.o. MRN: [id number]  CC:  Chief Complaint  Patient presents with  . Prediabetes  . Hypertension  . Hyperlipidemia    HPI Doris Harris presents for follow up on hypertension.  HTN: Pt denies chest pain, palpitations, dizziness, headache or lower extremity swelling. Taking medication as directed without side effects. Checks BP at home and readings range 132/74-76. Pt continues to follow a low salt diet. Tries to stay hydrated.  Weight: Reports weight has been fluctuating. Due to increase membership rates stopped going to [facility] but has restarted again. States goes 3 times/wk and sessions are > 1hr. She does cardio (treadmill and bike) and lifting weighting.   Vitamin D def: Reports has completed weekly Vit D and has started taking Vitamin D3 5000 units daily.  Past Medical History:  Diagnosis Date  . Back pain   . Hypertension   . IFG (impaired fasting glucose)    x1  . Joint pain   . Obesity   . Prediabetes   . Vitamin D deficiency     Past Surgical History:  Procedure Laterality Date  . MM BREAST STEREO BX*L*R/S    . TOTAL ABDOMINAL HYSTERECTOMY  1997   fibroid  . uterine polyps    . WRIST FRACTURE SURGERY  2000    Family History  Problem Relation Age of Onset  . Hypertension Mother   . Hyperlipidemia Mother   . Heart disease Mother   . Hypertension Father   . Hypertension Brother   . Diabetes Maternal Grandmother   . Coronary artery disease Other   . Hypertension Other   . Asthma Other   . Hypertension Brother   . Hypertension Sister   . Healthy Son   . Hypertension Sister   . Healthy Sister   . Hypertension Brother   . Healthy Brother   . Healthy Brother   . Healthy Brother   . Heart attack Brother     Social History   Socioeconomic History  . Marital status: Single    Spouse name: Not on file  . Number of children: Not on file  .  Years of education: Not on file  . Highest education level: Not on file  Occupational History  . Not on file  Tobacco Use  . Smoking status: Never Smoker  . Smokeless tobacco: Never Used  Vaping Use  . Vaping Use: Never used  Substance and Sexual Activity  . Alcohol use: No  . Drug use: No  . Sexual activity: Never  Other Topics Concern  . Not on file  Social History Narrative   Retired 12 13    Exercise  Neg tad    Never smoker   Sleep 8 or more hours   hh of 1  Attends 45+ yo days    No pets       Social Determinants of Corporate investment banker Strain: Not on file  Food Insecurity: Not on file  Transportation Needs: Not on file  Physical Activity: Not on file  Stress: Not on file  Social Connections: Not on file  Intimate Partner Violence: Not on file    Outpatient Medications Prior to Visit  Medication Sig Dispense Refill  . verapamil (CALAN-SR) 180 MG CR tablet Take 1 tablet (180 mg total) by mouth 2 (two) times daily. 180 tablet 0  . VITAMIN D PO Take 5,000 Units  by mouth.    . metoprolol succinate (TOPROL-XL) 100 MG 24 hr tablet TAKE 1 TABLET BY MOUTH  DAILY WITH OR IMMEDIATELY  FOLLOWING A MEAL 90 tablet 0  . olmesartan (BENICAR) 40 MG tablet TAKE 1 TABLET BY MOUTH  DAILY 90 tablet 0  . Vitamin D, Ergocalciferol, (DRISDOL) 1.25 MG (50000 UNIT) CAPS capsule Take 1 capsule (50,000 Units total) by mouth every 7 (seven) days. (Patient not taking: Reported on 05/17/2020) 12 capsule 0   No facility-administered medications prior to visit.    Allergies  Allergen Reactions  . Amlodipine Nausea And Vomiting  . Hctz [Hydrochlorothiazide] Other (See Comments)    Freq urination- 10+ times/ day  . Ibuprofen     REACTION: unspecified  . Lipitor [Atorvastatin Calcium] Other (See Comments)    Joint pain  . Lisinopril Cough  . Benazepril Rash  . Valsartan-Hydrochlorothiazide Rash    ROS Review of Systems A fourteen system review of systems was performed and found  to be positive as per HPI.   Objective:    Physical Exam General:  Pleasant and cooperative, in no acute distress Neuro:  Alert and oriented,  extra-ocular muscles intact  HEENT:  Normocephalic, atraumatic, neck supple Skin:  no gross rash, warm, pink. Cardiac:  RRR, S1 S2, w/o murmur  Respiratory:  ECTA B/L w/o wheezing, Not using accessory muscles, speaking in full sentences- unlabored. Vascular:  Ext warm, no cyanosis apprec.; cap RF less 2 sec. Psych:  No HI/SI, judgement and insight good, Euthymic mood. Full Affect.   BP (!) 148/79   Pulse 62   Temp 97.9 F (36.6 C)   Ht 5\' 8"  (1.727 m)   Wt 225 lb 4.8 oz (102.2 kg)   SpO2 98%   BMI 34.26 kg/m  Wt Readings from Last 3 Encounters:  05/17/20 225 lb 4.8 oz (102.2 kg)  05/04/20 222 lb (100.7 kg)  04/13/20 220 lb (99.8 kg)     Health Maintenance Due  Topic Date Due  . MAMMOGRAM  01/30/2009  . DEXA SCAN  Never done  . COLONOSCOPY (Pts 45-32yrs Insurance coverage will need to be confirmed)  04/09/2012  . PNA vac Low Risk Adult (2 of 2 - PPSV23) 02/22/2016  . INFLUENZA VACCINE  Never done    There are no preventive care reminders to display for this patient.  Lab Results  Component Value Date   TSH 1.460 06/01/2019   Lab Results  Component Value Date   WBC 6.1 06/01/2019   HGB 14.3 06/01/2019   HCT 42.4 06/01/2019   MCV 92 06/01/2019   PLT 186 06/01/2019   Lab Results  Component Value Date   NA 140 03/22/2020   K 4.4 03/22/2020   CO2 24 03/22/2020   GLUCOSE 93 03/22/2020   BUN 17 03/22/2020   CREATININE 0.84 03/22/2020   BILITOT 0.6 03/22/2020   ALKPHOS 110 03/22/2020   AST 20 03/22/2020   ALT 20 03/22/2020   PROT 7.8 03/22/2020   ALBUMIN 3.9 03/22/2020   CALCIUM 9.3 03/22/2020   GFR 81.03 02/22/2015   Lab Results  Component Value Date   CHOL 229 (H) 03/22/2020   Lab Results  Component Value Date   HDL 84 03/22/2020   Lab Results  Component Value Date   LDLCALC 134 (H) 03/22/2020   Lab  Results  Component Value Date   TRIG 63 03/22/2020   Lab Results  Component Value Date   CHOLHDL 2.8 01/12/2019   Lab Results  Component Value Date  HGBA1C 5.5 03/22/2020      Assessment & Plan:   Problem List Items Addressed This Visit      Cardiovascular and Mediastinum   Essential hypertension - Primary (Chronic)    - BP today is mildly elevated. Per previous BP readings in office BP has been elevated so likely component of white coat syndrome. Ambulatory BP readings are fairly controlled <135/80.  - Continue current medication regimen. - Follow DASH diet and continue physical activity regimen. - Last CMP renal function and electrolytes wnl. Will continue to monitor.       Relevant Medications   metoprolol succinate (TOPROL-XL) 100 MG 24 hr tablet   olmesartan (BENICAR) 40 MG tablet     Other   Obesity, Class I, BMI 30-34.9 (Chronic)    -Associated with hypertension and hyperlipidemia. -Followed by [facility].  -Continue dietary and lifestyle modifications.      Vitamin D deficiency    -Last Vitamin D 62.7, wnl -Continue Vit D3 5000 units.         Meds ordered this encounter  Medications  . metoprolol succinate (TOPROL-XL) 100 MG 24 hr tablet    Sig: TAKE 1 TABLET BY MOUTH  DAILY WITH OR IMMEDIATELY  FOLLOWING A MEAL    Dispense:  90 tablet    Refill:  1    Requesting 1 year supply    Order Specific Question:   Supervising Provider    Answer:   Nani Gasser D [[id number]]  . olmesartan (BENICAR) 40 MG tablet    Sig: Take 1 tablet (40 mg total) by mouth daily.    Dispense:  90 tablet    Refill:  1    Requesting 1 year supply    Order Specific Question:   Supervising Provider    Answer:   Nani Gasser D [[id number]]    Follow-up: Return in about 4 months (around 09/14/2020) for CPE.   Note:  This note was prepared with assistance of Dragon voice recognition software. Occasional wrong-word or sound-a-like substitutions may have  occurred due to the inherent limitations of voice recognition software.  Mayer Masker, PA-C

## 2020-05-17 NOTE — Assessment & Plan Note (Addendum)
-   BP today is mildly elevated. Per previous BP readings in office BP has been elevated so likely component of white coat syndrome. Ambulatory BP readings are fairly controlled <135/80.  - Continue current medication regimen. - Follow DASH diet and continue physical activity regimen. - Last CMP renal function and electrolytes wnl. Will continue to monitor.

## 2020-05-17 NOTE — Patient Instructions (Signed)

## 2020-05-17 NOTE — Assessment & Plan Note (Addendum)
-  Associated with hypertension and hyperlipidemia. -Followed by [facility].  -Continue dietary and lifestyle modifications.

## 2020-05-17 NOTE — Assessment & Plan Note (Addendum)
-  Last Vitamin D 62.7, wnl -Continue Vit D3 5000 units.

## 2020-05-25 ENCOUNTER — Ambulatory Visit (INDEPENDENT_AMBULATORY_CARE_PROVIDER_SITE_OTHER): Payer: Medicare Other | Admitting: Family Medicine

## 2020-05-25 ENCOUNTER — Other Ambulatory Visit: Payer: Self-pay

## 2020-05-25 ENCOUNTER — Encounter (INDEPENDENT_AMBULATORY_CARE_PROVIDER_SITE_OTHER): Payer: Self-pay | Admitting: Family Medicine

## 2020-05-25 VITALS — BP 150/77 | HR 61 | Temp 98.0°F | Ht 68.0 in | Wt 221.0 lb

## 2020-05-25 DIAGNOSIS — Z6833 Body mass index (BMI) 33.0-33.9, adult: Secondary | ICD-10-CM | POA: Diagnosis not present

## 2020-05-25 DIAGNOSIS — I10 Essential (primary) hypertension: Secondary | ICD-10-CM

## 2020-05-25 DIAGNOSIS — E669 Obesity, unspecified: Secondary | ICD-10-CM | POA: Diagnosis not present

## 2020-05-25 MED ORDER — DILTIAZEM HCL ER 240 MG PO CP24: 240.0000 mg | ORAL_CAPSULE | Freq: Every day | ORAL | 0 refills | Status: DC

## 2020-05-26 ENCOUNTER — Encounter (INDEPENDENT_AMBULATORY_CARE_PROVIDER_SITE_OTHER): Payer: Self-pay | Admitting: Family Medicine

## 2020-05-26 NOTE — Telephone Encounter (Signed)
fyi

## 2020-05-26 NOTE — Progress Notes (Signed)
Chief Complaint:   OBESITY Doris Harris is here to discuss her progress with her obesity treatment plan along with follow-up of her obesity related diagnoses. Doris Harris is on the Category 1 Plan or keeping a food journal and adhering to recommended goals of 1100-1200 calories and 80 grams of protein daily and states she is following her eating plan approximately 95% of the time. Doris Harris states she is at the [facility] on the treadmill, bike, and doing weights for 60 minutes 3 times per week.  Today's visit was #: 19 Starting weight: 234 lbs Starting date: 06/01/2019 Today's weight: 221 lbs Today's date: 05/25/2020 Total lbs lost to date: 13 Total lbs lost since last in-office visit: 1  Interim History: Doris Harris has increased her protein and she is having protein at all meals. Her appetite is satisfied. She is down 1 lb today.  Subjective:   1. Essential hypertension Doris Harris's blood pressure is elevated today. Her home blood pressure readings range between 130's-140's over 80-100. She denies chest pain. She feels verapamill BID causes nocturia. She has multiple allergies to anti-hypertensives.   BP Readings from Last 3 Encounters:  05/25/20 (!) 150/77  05/17/20 (!) 148/79  05/04/20 (!) 143/81   Lab Results  Component Value Date   CREATININE 0.84 03/22/2020   CREATININE 0.81 11/17/2019   CREATININE 0.83 06/01/2019   Assessment/Plan:   1. Essential hypertension Doris Harris agreed to discontinue verapamil, and will start diltiazem XR 240 mg q daily # 90 with no refills (discussed with Dr. Manson Passey).   2. Class 1 obesity with serious comorbidity and body mass index (BMI) of 33.0 to 33.9 in adult, unspecified obesity type Doris Harris is currently in the action stage of change. As such, her goal is to continue with weight loss efforts. She has agreed to the Category 1 Plan or keeping a food journal and adhering to recommended goals of 1100-1200 calories and 80 grams of protein daily.   Exercise goals: As  is.  Behavioral modification strategies: increasing lean protein intake.  Doris Harris has agreed to follow-up with our clinic in 3 weeks.  Objective:   Blood pressure (!) 150/77, pulse 61, temperature 98 F (36.7 C), height 5\' 8"  (1.727 m), weight 221 lb (100.2 kg), SpO2 98 %. Body mass index is 33.6 kg/m.  General: Cooperative, alert, well developed, in no acute distress. HEENT: Conjunctivae and lids unremarkable. Cardiovascular: Regular rhythm.  Lungs: Normal work of breathing. Neurologic: No focal deficits.   Lab Results  Component Value Date   CREATININE 0.84 03/22/2020   BUN 17 03/22/2020   NA 140 03/22/2020   K 4.4 03/22/2020   CL 103 03/22/2020   CO2 24 03/22/2020   Lab Results  Component Value Date   ALT 20 03/22/2020   AST 20 03/22/2020   ALKPHOS 110 03/22/2020   BILITOT 0.6 03/22/2020   Lab Results  Component Value Date   HGBA1C 5.5 03/22/2020   HGBA1C 5.5 11/17/2019   HGBA1C 5.6 06/01/2019   HGBA1C 5.7 (H) 01/12/2019   HGBA1C 5.7 (H) 02/11/2018   Lab Results  Component Value Date   INSULIN 5.3 03/22/2020   INSULIN 6.6 11/17/2019   INSULIN 8.7 06/01/2019   Lab Results  Component Value Date   TSH 1.460 06/01/2019   Lab Results  Component Value Date   CHOL 229 (H) 03/22/2020   HDL 84 03/22/2020   LDLCALC 134 (H) 03/22/2020   LDLDIRECT 120.2 09/05/2012   TRIG 63 03/22/2020   CHOLHDL 2.8 01/12/2019   Lab  Results  Component Value Date   WBC 6.1 06/01/2019   HGB 14.3 06/01/2019   HCT 42.4 06/01/2019   MCV 92 06/01/2019   PLT 186 06/01/2019   No results found for: IRON, TIBC, FERRITIN  Obesity Behavioral Intervention:   Approximately 15 minutes were spent on the discussion below.  ASK: We discussed the diagnosis of obesity with Basya today and Doris Harris agreed to give [name] permission to discuss obesity behavioral modification therapy today.  ASSESS: Doris Harris has the diagnosis of obesity and her BMI today is 33.61. Khrista is in the action stage of  change.   ADVISE: Doris Harris was educated on the multiple health risks of obesity as well as the benefit of weight loss to improve her health. She was advised of the need for long term treatment and the importance of lifestyle modifications to improve her current health and to decrease her risk of future health problems.  AGREE: Multiple dietary modification options and treatment options were discussed and Doris Harris agreed to follow the recommendations documented in the above note.  ARRANGE: Subrina was educated on the importance of frequent visits to treat obesity as outlined per CMS and USPSTF guidelines and agreed to schedule her next follow up appointment today.  Attestation Statements:   Reviewed by clinician on day of visit: allergies, medications, problem list, medical history, surgical history, family history, social history, and previous encounter notes.   Doris Harris, am acting as Energy manager for [place], FNP-C.  I have reviewed the above documentation for accuracy and completeness, and I agree with the above. -  Jesse Sans, FNP

## 2020-05-28 ENCOUNTER — Encounter (INDEPENDENT_AMBULATORY_CARE_PROVIDER_SITE_OTHER): Payer: Self-pay | Admitting: Family Medicine

## 2020-05-30 NOTE — Telephone Encounter (Signed)
RX clarification

## 2020-06-05 ENCOUNTER — Other Ambulatory Visit (INDEPENDENT_AMBULATORY_CARE_PROVIDER_SITE_OTHER): Payer: Self-pay | Admitting: Family Medicine

## 2020-06-05 DIAGNOSIS — E559 Vitamin D deficiency, unspecified: Secondary | ICD-10-CM

## 2020-06-06 NOTE — Telephone Encounter (Signed)
RF request. This was discontinued by another provider.

## 2020-06-06 NOTE — Telephone Encounter (Signed)
Last seen by Dawn Whitmire, FNP 

## 2020-06-15 ENCOUNTER — Ambulatory Visit (INDEPENDENT_AMBULATORY_CARE_PROVIDER_SITE_OTHER): Payer: Medicare Other | Admitting: Family Medicine

## 2020-06-15 ENCOUNTER — Encounter (INDEPENDENT_AMBULATORY_CARE_PROVIDER_SITE_OTHER): Payer: Self-pay | Admitting: Family Medicine

## 2020-06-15 ENCOUNTER — Other Ambulatory Visit: Payer: Self-pay

## 2020-06-15 VITALS — BP 147/78 | HR 63 | Temp 97.6°F | Ht 68.0 in | Wt 221.0 lb

## 2020-06-15 DIAGNOSIS — I10 Essential (primary) hypertension: Secondary | ICD-10-CM

## 2020-06-15 DIAGNOSIS — E669 Obesity, unspecified: Secondary | ICD-10-CM | POA: Diagnosis not present

## 2020-06-15 DIAGNOSIS — Z6833 Body mass index (BMI) 33.0-33.9, adult: Secondary | ICD-10-CM

## 2020-06-16 ENCOUNTER — Encounter (INDEPENDENT_AMBULATORY_CARE_PROVIDER_SITE_OTHER): Payer: Self-pay | Admitting: Family Medicine

## 2020-06-16 NOTE — Progress Notes (Signed)
Chief Complaint:   OBESITY Doris Harris is here to discuss her progress with her obesity treatment plan along with follow-up of her obesity related diagnoses. Doris Harris is on the Category 1 Plan or keeping a food journal and adhering to recommended goals of 1100-1200 calories and 80 grams of protein and states she is following her eating plan approximately 98% of the time. Doris Harris states she is going to the [facility] for cardio and weights for 60-90 minutes 3 times per week.  Today's visit was #: 20 Starting weight: 234 lbs Starting date: 06/01/2019 Today's weight: 221 lbs Today's date: 06/15/2020 Total lbs lost to date: 13 lbs Total lbs lost since last in-office visit: 0  Interim History: Doris Harris has been sticking to plan very well.  Averages 1100 calories but only meets protein goals some days.  She has increased her exercise and is journaling consistently.  Subjective:   1. Essential hypertension Blood pressure is still elevated in office.  Switched from verapamil to diltiazem at last office visit.  Blood pressure at home 120s-130s/70s.  She feels better on this medication and she says home blood pressure are lower.  Nocturia is now resolved.  BP Readings from Last 3 Encounters:  06/15/20 (!) 147/78  05/25/20 (!) 150/77  05/17/20 (!) 148/79   Assessment/Plan:   1. Essential hypertension Continue diltiazem 240 mg.  2. Class 1 obesity with serious comorbidity and body mass index (BMI) of 33.0 to 33.9 in adult, unspecified obesity type  Doris Harris is currently in the action stage of change. As such, her goal is to continue with weight loss efforts. She has agreed to the Category 1 Plan.   Handout provided:  Protein Content of Foods.    Discussed ideas for foods with high protein.  Exercise goals: As is.  Behavioral modification strategies: increasing lean protein intake.  Doris Harris has agreed to follow-up with our clinic in 3 weeks. She was informed of the importance of frequent follow-up visits to  maximize her success with intensive lifestyle modifications for her multiple health conditions.   Objective:   Blood pressure (!) 147/78, pulse 63, temperature 97.6 F (36.4 C), height 5\' 8"  (1.727 m), weight 221 lb (100.2 kg), SpO2 96 %. Body mass index is 33.6 kg/m.  General: Cooperative, alert, well developed, in no acute distress. HEENT: Conjunctivae and lids unremarkable. Cardiovascular: Regular rhythm.  Lungs: Normal work of breathing. Neurologic: No focal deficits.   Lab Results  Component Value Date   CREATININE 0.84 03/22/2020   BUN 17 03/22/2020   NA 140 03/22/2020   K 4.4 03/22/2020   CL 103 03/22/2020   CO2 24 03/22/2020   Lab Results  Component Value Date   ALT 20 03/22/2020   AST 20 03/22/2020   ALKPHOS 110 03/22/2020   BILITOT 0.6 03/22/2020   Lab Results  Component Value Date   HGBA1C 5.5 03/22/2020   HGBA1C 5.5 11/17/2019   HGBA1C 5.6 06/01/2019   HGBA1C 5.7 (H) 01/12/2019   HGBA1C 5.7 (H) 02/11/2018   Lab Results  Component Value Date   INSULIN 5.3 03/22/2020   INSULIN 6.6 11/17/2019   INSULIN 8.7 06/01/2019   Lab Results  Component Value Date   TSH 1.460 06/01/2019   Lab Results  Component Value Date   CHOL 229 (H) 03/22/2020   HDL 84 03/22/2020   LDLCALC 134 (H) 03/22/2020   LDLDIRECT 120.2 09/05/2012   TRIG 63 03/22/2020   CHOLHDL 2.8 01/12/2019   Lab Results  Component Value Date  WBC 6.1 06/01/2019   HGB 14.3 06/01/2019   HCT 42.4 06/01/2019   MCV 92 06/01/2019   PLT 186 06/01/2019   Obesity Behavioral Intervention:   Approximately 15 minutes were spent on the discussion below.  ASK: We discussed the diagnosis of obesity with Doris Harris today and Doris Harris agreed to give [name] permission to discuss obesity behavioral modification therapy today.  ASSESS: Khamiyah has the diagnosis of obesity and her BMI today is 33.7. Doris Harris is in the action stage of change.   ADVISE: Doris Harris was educated on the multiple health risks of obesity as well  as the benefit of weight loss to improve her health. She was advised of the need for long term treatment and the importance of lifestyle modifications to improve her current health and to decrease her risk of future health problems.  AGREE: Multiple dietary modification options and treatment options were discussed and Doris Harris agreed to follow the recommendations documented in the above note.  ARRANGE: Doris Harris was educated on the importance of frequent visits to treat obesity as outlined per CMS and USPSTF guidelines and agreed to schedule her next follow up appointment today.  Attestation Statements:   Reviewed by clinician on day of visit: allergies, medications, problem list, medical history, surgical history, family history, social history, and previous encounter notes.  I, Insurance claims handler, CMA, am acting as Energy manager for [place], FNP.  I have reviewed the above documentation for accuracy and completeness, and I agree with the above. -  Jesse Sans, FNP

## 2020-07-06 ENCOUNTER — Ambulatory Visit (INDEPENDENT_AMBULATORY_CARE_PROVIDER_SITE_OTHER): Payer: Medicare Other | Admitting: Family Medicine

## 2020-07-06 ENCOUNTER — Other Ambulatory Visit: Payer: Self-pay

## 2020-07-06 ENCOUNTER — Encounter (INDEPENDENT_AMBULATORY_CARE_PROVIDER_SITE_OTHER): Payer: Self-pay | Admitting: Family Medicine

## 2020-07-06 VITALS — BP 143/70 | HR 58 | Temp 97.9°F | Ht 68.0 in | Wt 224.0 lb

## 2020-07-06 DIAGNOSIS — Z6835 Body mass index (BMI) 35.0-35.9, adult: Secondary | ICD-10-CM

## 2020-07-06 DIAGNOSIS — I10 Essential (primary) hypertension: Secondary | ICD-10-CM

## 2020-07-12 ENCOUNTER — Encounter (INDEPENDENT_AMBULATORY_CARE_PROVIDER_SITE_OTHER): Payer: Self-pay | Admitting: Family Medicine

## 2020-07-12 NOTE — Progress Notes (Signed)
Chief Complaint:   OBESITY Doris Harris is here to discuss her progress with her obesity treatment plan along with follow-up of her obesity related diagnoses. Doris Harris is on the Category 1 Plan and states she is following her eating plan approximately 90% of the time. Doris Harris states she is doing cardio for 60-90 minutes 3 times per week.  Today's visit was #: 21 Starting weight: 234 lbs Starting date: 06/01/2019 Today's weight: 224 lbs Today's date: 07/06/2020 Total lbs lost to date: 10 lbs Total lbs lost since last in-office visit: +3  Interim History: Doris Harris is eating granola at breakfast rather than Category 1 breakfast.  She generally has a sandwich at lunch.  Sometimes has a [place] burger at supper with bread.  Notes increased stress recently, but denies stress eating.  Subjective:   1. Essential hypertension Blood pressure better today, but still slightly high.  BP Readings from Last 3 Encounters:  07/06/20 (!) 143/70  06/15/20 (!) 147/78  05/25/20 (!) 150/77   Assessment/Plan:   1. Essential hypertension Continue diltiazem, metoprolol, and Benicar.   2. Obesity: BMI 34.2  Doris Harris is currently in the action stage of change. As such, her goal is to continue with weight loss efforts. She has agreed to the Category 1 Plan.  She will try to adhere more closely to the plan.  Exercise goals: As is.  Behavioral modification strategies: decreasing simple carbohydrates.  Doris Harris has agreed to follow-up with our clinic in 3 weeks.  Objective:   Blood pressure (!) 143/70, pulse (!) 58, temperature 97.9 F (36.6 C), height 5\' 8"  (1.727 m), weight 224 lb (101.6 kg), SpO2 97 %. Body mass index is 34.06 kg/m.  General: Cooperative, alert, well developed, in no acute distress. HEENT: Conjunctivae and lids unremarkable. Cardiovascular: Regular rhythm.  Lungs: Normal work of breathing. Neurologic: No focal deficits.   Lab Results  Component Value Date   CREATININE 0.84 03/22/2020    BUN 17 03/22/2020   NA 140 03/22/2020   K 4.4 03/22/2020   CL 103 03/22/2020   CO2 24 03/22/2020   Lab Results  Component Value Date   ALT 20 03/22/2020   AST 20 03/22/2020   ALKPHOS 110 03/22/2020   BILITOT 0.6 03/22/2020   Lab Results  Component Value Date   HGBA1C 5.5 03/22/2020   HGBA1C 5.5 11/17/2019   HGBA1C 5.6 06/01/2019   HGBA1C 5.7 (H) 01/12/2019   HGBA1C 5.7 (H) 02/11/2018   Lab Results  Component Value Date   INSULIN 5.3 03/22/2020   INSULIN 6.6 11/17/2019   INSULIN 8.7 06/01/2019   Lab Results  Component Value Date   TSH 1.460 06/01/2019   Lab Results  Component Value Date   CHOL 229 (H) 03/22/2020   HDL 84 03/22/2020   LDLCALC 134 (H) 03/22/2020   LDLDIRECT 120.2 09/05/2012   TRIG 63 03/22/2020   CHOLHDL 2.8 01/12/2019   Lab Results  Component Value Date   WBC 6.1 06/01/2019   HGB 14.3 06/01/2019   HCT 42.4 06/01/2019   MCV 92 06/01/2019   PLT 186 06/01/2019   Obesity Behavioral Intervention:   Approximately 15 minutes were spent on the discussion below.  ASK: We discussed the diagnosis of obesity with Kamylle today and Doris Harris agreed to give 06/03/2019 permission to discuss obesity behavioral modification therapy today.  ASSESS: Doris Harris has the diagnosis of obesity and her BMI today is 34.2. Doris Harris is in the action stage of change.   ADVISE: Doris Harris was educated on the multiple  health risks of obesity as well as the benefit of weight loss to improve her health. She was advised of the need for long term treatment and the importance of lifestyle modifications to improve her current health and to decrease her risk of future health problems.  AGREE: Multiple dietary modification options and treatment options were discussed and Doris Harris agreed to follow the recommendations documented in the above note.  ARRANGE: Doris Harris was educated on the importance of frequent visits to treat obesity as outlined per CMS and USPSTF guidelines and agreed to schedule her next  follow up appointment today.  Attestation Statements:   Reviewed by clinician on day of visit: allergies, medications, problem list, medical history, surgical history, family history, social history, and previous encounter notes.  I, Insurance claims handler, CMA, am acting as Energy manager for [place], FNP.  I have reviewed the above documentation for accuracy and completeness, and I agree with the above. -  Jesse Sans, FNP

## 2020-07-26 ENCOUNTER — Other Ambulatory Visit (INDEPENDENT_AMBULATORY_CARE_PROVIDER_SITE_OTHER): Payer: Self-pay | Admitting: Family Medicine

## 2020-07-26 DIAGNOSIS — I10 Essential (primary) hypertension: Secondary | ICD-10-CM

## 2020-07-27 ENCOUNTER — Other Ambulatory Visit: Payer: Self-pay

## 2020-07-27 ENCOUNTER — Ambulatory Visit (INDEPENDENT_AMBULATORY_CARE_PROVIDER_SITE_OTHER): Payer: Medicare Other | Admitting: Family Medicine

## 2020-07-27 ENCOUNTER — Encounter (INDEPENDENT_AMBULATORY_CARE_PROVIDER_SITE_OTHER): Payer: Self-pay | Admitting: Family Medicine

## 2020-07-27 VITALS — BP 149/80 | HR 58 | Temp 98.5°F | Ht 68.0 in | Wt 224.0 lb

## 2020-07-27 DIAGNOSIS — I10 Essential (primary) hypertension: Secondary | ICD-10-CM | POA: Diagnosis not present

## 2020-07-27 DIAGNOSIS — E8881 Metabolic syndrome: Secondary | ICD-10-CM | POA: Diagnosis not present

## 2020-07-27 DIAGNOSIS — E88819 Insulin resistance, unspecified: Secondary | ICD-10-CM

## 2020-07-27 DIAGNOSIS — Z6835 Body mass index (BMI) 35.0-35.9, adult: Secondary | ICD-10-CM

## 2020-07-27 MED ORDER — DILTIAZEM HCL ER COATED BEADS 300 MG PO CP24: 300.0000 mg | ORAL_CAPSULE | Freq: Every day | ORAL | 0 refills | Status: DC

## 2020-07-28 ENCOUNTER — Encounter (INDEPENDENT_AMBULATORY_CARE_PROVIDER_SITE_OTHER): Payer: Self-pay | Admitting: Family Medicine

## 2020-07-28 NOTE — Progress Notes (Signed)
Chief Complaint:   OBESITY Doris Harris is here to discuss her progress with her obesity treatment plan along with follow-up of her obesity related diagnoses. Doris Harris is on the Category 1 Plan and states she is following her eating plan approximately 98% of the time. Doris Harris states she is going to the [facility] for 60 minutes 3 times per week.  Today's visit was #: 22 Starting weight: 234 lbs Starting date: 06/01/2019 Today's weight: 224 lbs Today's date: 07/27/2020 Total lbs lost to date: 10 lbs Total lbs lost since last in-office visit: 0  Interim History: Doris Harris says that for breakfast she has eggs or Special K cereal.  For lunch, a sandwich with 4 ounces of meat and vegetable.  Dinner is 6 ounces of meat and vegetable.    Subjective:   1. Essential hypertension Reasonably well controlled but not optimized.  Blood pressure is 143/70 today.  She is on diltiazem HCL 24 hour 240 mg daily, metoprolol 24 hr 100 mg, and Benicar 40 mg daily.  She has not tolerated diuretics due to nocturia.   BP Readings from Last 3 Encounters:  07/27/20 (!) 149/80  07/06/20 (!) 143/70  06/15/20 (!) 147/78   2. Insulin resistance Notes hunger is mostly controlled.  Lab Results  Component Value Date   INSULIN 5.3 03/22/2020   INSULIN 6.6 11/17/2019   INSULIN 8.7 06/01/2019   Lab Results  Component Value Date   HGBA1C 5.5 03/22/2020   Assessment/Plan:   1. Essential hypertension Change to Cardizem CD 300 mg daily.  - Increase diltiazem (CARDIZEM CD) 300 MG 24 hr capsule; Take 1 capsule (300 mg total) by mouth daily.  Dispense: 30 capsule; Refill: 0  2. Insulin resistance Discussed Ozempic and she declines.  3. Obesity: BMI 34.07  Doris Harris is currently in the action stage of change. As such, her goal is to continue with weight loss efforts. She has agreed to keeping a food journal and adhering to recommended goals of 1000-1100 calories and 80 grams of protein.   Switch to journaling.  She will try to  keep calories to 1000 per day.  Exercise goals: Add cardio for 2 days per week.  Behavioral modification strategies: increasing lean protein intake and decreasing simple carbohydrates.  Doris Harris has agreed to follow-up with our clinic in 4 weeks.  Objective:   Blood pressure (!) 149/80, pulse (!) 58, temperature 98.5 F (36.9 C), height 5\' 8"  (1.727 m), weight 224 lb (101.6 kg), SpO2 99 %. Body mass index is 34.06 kg/m.  General: Cooperative, alert, well developed, in no acute distress. HEENT: Conjunctivae and lids unremarkable. Cardiovascular: Regular rhythm.  Lungs: Normal work of breathing. Neurologic: No focal deficits.   Lab Results  Component Value Date   CREATININE 0.84 03/22/2020   BUN 17 03/22/2020   NA 140 03/22/2020   K 4.4 03/22/2020   CL 103 03/22/2020   CO2 24 03/22/2020   Lab Results  Component Value Date   ALT 20 03/22/2020   AST 20 03/22/2020   ALKPHOS 110 03/22/2020   BILITOT 0.6 03/22/2020   Lab Results  Component Value Date   HGBA1C 5.5 03/22/2020   HGBA1C 5.5 11/17/2019   HGBA1C 5.6 06/01/2019   HGBA1C 5.7 (H) 01/12/2019   HGBA1C 5.7 (H) 02/11/2018   Lab Results  Component Value Date   INSULIN 5.3 03/22/2020   INSULIN 6.6 11/17/2019   INSULIN 8.7 06/01/2019   Lab Results  Component Value Date   TSH 1.460 06/01/2019  Lab Results  Component Value Date   CHOL 229 (H) 03/22/2020   HDL 84 03/22/2020   LDLCALC 134 (H) 03/22/2020   LDLDIRECT 120.2 09/05/2012   TRIG 63 03/22/2020   CHOLHDL 2.8 01/12/2019   Lab Results  Component Value Date   WBC 6.1 06/01/2019   HGB 14.3 06/01/2019   HCT 42.4 06/01/2019   MCV 92 06/01/2019   PLT 186 06/01/2019   Obesity Behavioral Intervention:   Approximately 15 minutes were spent on the discussion below.  ASK: We discussed the diagnosis of obesity with Doris Harris today and Doris Harris agreed to give [name] permission to discuss obesity behavioral modification therapy today.  ASSESS: Doris Harris has the diagnosis  of obesity and her BMI today is 34.1. Doris Harris is in the action stage of change.   ADVISE: Doris Harris was educated on the multiple health risks of obesity as well as the benefit of weight loss to improve her health. She was advised of the need for long term treatment and the importance of lifestyle modifications to improve her current health and to decrease her risk of future health problems.  AGREE: Multiple dietary modification options and treatment options were discussed and Doris Harris agreed to follow the recommendations documented in the above note.  ARRANGE: Doris Harris was educated on the importance of frequent visits to treat obesity as outlined per CMS and USPSTF guidelines and agreed to schedule her next follow up appointment today.  Attestation Statements:   Reviewed by clinician on day of visit: allergies, medications, problem list, medical history, surgical history, family history, social history, and previous encounter notes.  I, Insurance claims handler, CMA, am acting as Energy manager for [place], FNP.  I have reviewed the above documentation for accuracy and completeness, and I agree with the above. -  Jesse Sans, FNP

## 2020-08-24 ENCOUNTER — Ambulatory Visit (INDEPENDENT_AMBULATORY_CARE_PROVIDER_SITE_OTHER): Payer: Medicare Other | Admitting: Family Medicine

## 2020-08-24 ENCOUNTER — Encounter (INDEPENDENT_AMBULATORY_CARE_PROVIDER_SITE_OTHER): Payer: Self-pay | Admitting: Family Medicine

## 2020-08-24 ENCOUNTER — Other Ambulatory Visit: Payer: Self-pay

## 2020-08-24 VITALS — BP 156/80 | HR 60 | Temp 97.6°F | Ht 68.0 in | Wt 225.0 lb

## 2020-08-24 DIAGNOSIS — Z6835 Body mass index (BMI) 35.0-35.9, adult: Secondary | ICD-10-CM

## 2020-08-24 DIAGNOSIS — I10 Essential (primary) hypertension: Secondary | ICD-10-CM

## 2020-08-24 DIAGNOSIS — E88819 Insulin resistance, unspecified: Secondary | ICD-10-CM

## 2020-08-24 DIAGNOSIS — E8881 Metabolic syndrome: Secondary | ICD-10-CM | POA: Diagnosis not present

## 2020-08-24 MED ORDER — DILTIAZEM HCL ER COATED BEADS 300 MG PO CP24: 300.0000 mg | ORAL_CAPSULE | Freq: Every day | ORAL | 0 refills | Status: DC

## 2020-08-24 NOTE — Progress Notes (Signed)
Chief Complaint:   OBESITY Doris Harris is here to discuss her progress with her obesity treatment plan along with follow-up of her obesity related diagnoses. Doris Harris is on keeping a food journal and adhering to recommended goals of 1000-1100 calories and 80 grams of protein daily and states she is following her eating plan approximately 98% of the time. Doris Harris states she is walking for 45-60 minutes 3 times per week.  Today's visit was #: 23 Starting weight: 234 lbs Starting date: 06/01/2019 Today's weight: 225 lbs Today's date: 08/24/2020 Total lbs lost to date: 9 Total lbs lost since last in-office visit: 0  Interim History: She is quite frustrated with lack of weight loss. Doris Harris denies excessive hunger.   She feels she could "go all day" without eating. She has been journaling and keeping calories around 1100-1170 per day. She meets her protein goal 98% of the time.  She has had back pain that has prevented her normal exercise routine.  Subjective:   1. Essential hypertension Doris Harris's blood pressure is not well controlled. Her home blood pressures run between 130-140's over 70-80. She has sensitivity to several blood pressure medications. She is currently taking Cardizem, metoprolol, and olmesartan. Cardiovascular ROS: no chest pain or dyspnea on exertion.  BP Readings from Last 3 Encounters:  08/24/20 (!) 156/80  07/27/20 (!) 149/80  07/06/20 (!) 143/70   Lab Results  Component Value Date   CREATININE 0.84 03/22/2020   CREATININE 0.81 11/17/2019   CREATININE 0.83 06/01/2019   2. Insulin resistance Doris Harris denies polyphagia, and she is not on metformin. Last A1c was 5.5.  Lab Results  Component Value Date   INSULIN 5.3 03/22/2020   INSULIN 6.6 11/17/2019   INSULIN 8.7 06/01/2019   Lab Results  Component Value Date   HGBA1C 5.5 03/22/2020   Assessment/Plan:   1. Essential hypertension  We will refill Cardizem for 90 days with no refills. May need to increase dose of  Cardizem.  - diltiazem (CARDIZEM CD) 300 MG 24 hr capsule; Take 1 capsule (300 mg total) by mouth daily.  Dispense: 90 capsule; Refill: 0  2. Insulin resistance Doris Harris will continue her meal plan, and will continue to work on weight loss, exercise, and decreasing simple carbohydrates to help decrease the risk of diabetes. Doris Harris agreed to follow-up with [name] as directed to closely monitor her progress.  3. Obesity: BMI 34.22 Doris Harris is currently in the action stage of change. As such, her goal is to continue with weight loss efforts. She has agreed to the Category 1 Plan - 100 calories.   Exercise goals: As is.  Behavioral modification strategies: planning for success.  Doris Harris has agreed to follow-up with our clinic in 5 weeks.   Objective:   Blood pressure (!) 156/80, pulse 60, temperature 97.6 F (36.4 C), height 5\' 8"  (1.727 m), weight 225 lb (102.1 kg), SpO2 98 %. Body mass index is 34.21 kg/m.  General: Cooperative, alert, well developed, in no acute distress. HEENT: Conjunctivae and lids unremarkable. Cardiovascular: Regular rhythm.  Lungs: Normal work of breathing. Neurologic: No focal deficits.   Lab Results  Component Value Date   CREATININE 0.84 03/22/2020   BUN 17 03/22/2020   NA 140 03/22/2020   K 4.4 03/22/2020   CL 103 03/22/2020   CO2 24 03/22/2020   Lab Results  Component Value Date   ALT 20 03/22/2020   AST 20 03/22/2020   ALKPHOS 110 03/22/2020   BILITOT 0.6 03/22/2020   Lab Results  Component Value Date   HGBA1C 5.5 03/22/2020   HGBA1C 5.5 11/17/2019   HGBA1C 5.6 06/01/2019   HGBA1C 5.7 (H) 01/12/2019   HGBA1C 5.7 (H) 02/11/2018   Lab Results  Component Value Date   INSULIN 5.3 03/22/2020   INSULIN 6.6 11/17/2019   INSULIN 8.7 06/01/2019   Lab Results  Component Value Date   TSH 1.460 06/01/2019   Lab Results  Component Value Date   CHOL 229 (H) 03/22/2020   HDL 84 03/22/2020   LDLCALC 134 (H) 03/22/2020   LDLDIRECT 120.2 09/05/2012    TRIG 63 03/22/2020   CHOLHDL 2.8 01/12/2019   Lab Results  Component Value Date   WBC 6.1 06/01/2019   HGB 14.3 06/01/2019   HCT 42.4 06/01/2019   MCV 92 06/01/2019   PLT 186 06/01/2019   No results found for: IRON, TIBC, FERRITIN  Obesity Behavioral Intervention:   Approximately 15 minutes were spent on the discussion below.  ASK: We discussed the diagnosis of obesity with Doris Harris today and Doris Harris agreed to give [name] permission to discuss obesity behavioral modification therapy today.  ASSESS: Doris Harris has the diagnosis of obesity and her BMI today is 34.22. Doris Harris is in the action stage of change.   ADVISE: Doris Harris was educated on the multiple health risks of obesity as well as the benefit of weight loss to improve her health. She was advised of the need for long term treatment and the importance of lifestyle modifications to improve her current health and to decrease her risk of future health problems.  AGREE: Multiple dietary modification options and treatment options were discussed and Doris Harris agreed to follow the recommendations documented in the above note.  ARRANGE: Doris Harris was educated on the importance of frequent visits to treat obesity as outlined per CMS and USPSTF guidelines and agreed to schedule her next follow up appointment today.  Attestation Statements:   Reviewed by clinician on day of visit: allergies, medications, problem list, medical history, surgical history, family history, social history, and previous encounter notes.   Trude Mcburney, am acting as Energy manager for [place], FNP-C.  I have reviewed the above documentation for accuracy and completeness, and I agree with the above. -  Jesse Sans, FNP

## 2020-08-25 ENCOUNTER — Encounter (INDEPENDENT_AMBULATORY_CARE_PROVIDER_SITE_OTHER): Payer: Self-pay | Admitting: Family Medicine

## 2020-09-12 ENCOUNTER — Other Ambulatory Visit: Payer: Self-pay

## 2020-09-12 DIAGNOSIS — I10 Essential (primary) hypertension: Secondary | ICD-10-CM

## 2020-09-12 DIAGNOSIS — R7303 Prediabetes: Secondary | ICD-10-CM

## 2020-09-12 DIAGNOSIS — E559 Vitamin D deficiency, unspecified: Secondary | ICD-10-CM

## 2020-09-12 DIAGNOSIS — E7800 Pure hypercholesterolemia, unspecified: Secondary | ICD-10-CM

## 2020-09-12 DIAGNOSIS — Z1329 Encounter for screening for other suspected endocrine disorder: Secondary | ICD-10-CM

## 2020-09-13 ENCOUNTER — Other Ambulatory Visit: Payer: Self-pay

## 2020-09-13 ENCOUNTER — Other Ambulatory Visit: Payer: Medicare Other

## 2020-09-13 DIAGNOSIS — E7800 Pure hypercholesterolemia, unspecified: Secondary | ICD-10-CM

## 2020-09-13 DIAGNOSIS — R7303 Prediabetes: Secondary | ICD-10-CM

## 2020-09-13 DIAGNOSIS — Z1329 Encounter for screening for other suspected endocrine disorder: Secondary | ICD-10-CM | POA: Diagnosis not present

## 2020-09-13 DIAGNOSIS — I10 Essential (primary) hypertension: Secondary | ICD-10-CM

## 2020-09-14 LAB — LIPID PANEL
Chol/HDL Ratio: 2.9 ratio (ref 0.0–4.4)
Cholesterol, Total: 215 mg/dL — ABNORMAL HIGH (ref 100–199)
HDL: 74 mg/dL (ref >39)
LDL Chol Calc (NIH): 129 mg/dL — ABNORMAL HIGH (ref 0–99)
Triglycerides: 67 mg/dL (ref 0–149)
VLDL Cholesterol Cal: 12 mg/dL (ref 5–40)

## 2020-09-14 LAB — CBC
Hematocrit: 40.3 % (ref 34.0–46.6)
Hemoglobin: 13.6 g/dL (ref 11.1–15.9)
MCH: 31.3 pg (ref 26.6–33.0)
MCHC: 33.7 g/dL (ref 31.5–35.7)
MCV: 93 fL (ref 79–97)
Platelets: 191 10*3/uL (ref 150–450)
RBC: 4.35 x10E6/uL (ref 3.77–5.28)
RDW: 12.7 % (ref 11.7–15.4)
WBC: 5.9 10*3/uL (ref 3.4–10.8)

## 2020-09-14 LAB — COMPREHENSIVE METABOLIC PANEL
ALT: 18 IU/L (ref 0–32)
AST: 18 IU/L (ref 0–40)
Albumin/Globulin Ratio: 1.1 — ABNORMAL LOW (ref 1.2–2.2)
Albumin: 3.8 g/dL (ref 3.7–4.7)
Alkaline Phosphatase: 113 IU/L (ref 44–121)
BUN/Creatinine Ratio: 18 (ref 12–28)
BUN: 14 mg/dL (ref 8–27)
Bilirubin Total: 0.4 mg/dL (ref 0.0–1.2)
CO2: 24 mmol/L (ref 20–29)
Calcium: 9.2 mg/dL (ref 8.7–10.3)
Chloride: 100 mmol/L (ref 96–106)
Creatinine, Ser: 0.8 mg/dL (ref 0.57–1.00)
Globulin, Total: 3.6 g/dL (ref 1.5–4.5)
Glucose: 98 mg/dL (ref 65–99)
Potassium: 4.1 mmol/L (ref 3.5–5.2)
Sodium: 139 mmol/L (ref 134–144)
Total Protein: 7.4 g/dL (ref 6.0–8.5)
eGFR: 78 mL/min/{1.73_m2} (ref >59)

## 2020-09-14 LAB — HEMOGLOBIN A1C
Est. average glucose Bld gHb Est-mCnc: 114 mg/dL
Hgb A1c MFr Bld: 5.6 % (ref 4.8–5.6)

## 2020-09-14 LAB — TSH: TSH: 1.78 u[IU]/mL (ref 0.450–4.500)

## 2020-09-20 ENCOUNTER — Encounter: Payer: Self-pay | Admitting: Physician Assistant

## 2020-09-20 ENCOUNTER — Other Ambulatory Visit: Payer: Self-pay

## 2020-09-20 ENCOUNTER — Ambulatory Visit (INDEPENDENT_AMBULATORY_CARE_PROVIDER_SITE_OTHER): Payer: Medicare Other | Admitting: Physician Assistant

## 2020-09-20 VITALS — BP 138/78 | HR 67 | Temp 98.4°F | Ht 68.0 in | Wt 227.5 lb

## 2020-09-20 DIAGNOSIS — Z0000 Encounter for general adult medical examination without abnormal findings: Secondary | ICD-10-CM

## 2020-09-20 DIAGNOSIS — Z789 Other specified health status: Secondary | ICD-10-CM | POA: Diagnosis not present

## 2020-09-20 DIAGNOSIS — E7849 Other hyperlipidemia: Secondary | ICD-10-CM

## 2020-09-20 DIAGNOSIS — I10 Essential (primary) hypertension: Secondary | ICD-10-CM

## 2020-09-20 DIAGNOSIS — Z1211 Encounter for screening for malignant neoplasm of colon: Secondary | ICD-10-CM

## 2020-09-20 DIAGNOSIS — E669 Obesity, unspecified: Secondary | ICD-10-CM

## 2020-09-20 NOTE — Progress Notes (Signed)
Subjective:     Doris Harris is a 75 y.o. female and is here for a comprehensive physical exam. The patient reports problems - chronic fatigue, reports self discontinued Olmesartan due to severe joint pain . Continues to check BP at home and reports readings range from 120-130s/70-80s.   Social History   Socioeconomic History   Marital status: Single    Spouse name: Not on file   Number of children: Not on file   Years of education: Not on file   Highest education level: Not on file  Occupational History   Not on file  Tobacco Use   Smoking status: Never   Smokeless tobacco: Never  Vaping Use   Vaping Use: Never used  Substance and Sexual Activity   Alcohol use: No   Drug use: No   Sexual activity: Never  Other Topics Concern   Not on file  Social History Narrative   Retired 12 13    Exercise  Neg tad    Never smoker   Sleep 8 or more hours   hh of 1  Attends 88+ yo days    No pets       Social Determinants of Health   Financial Resource Strain: Not on file  Food Insecurity: Not on file  Transportation Needs: Not on file  Physical Activity: Not on file  Stress: Not on file  Social Connections: Not on file  Intimate Partner Violence: Not on file   Health Maintenance  Topic Date Due   COVID-19 Vaccine (3 - Moderna risk series) 02/04/2020   Zoster Vaccines- Shingrix (1 of 2) 12/21/2020 (Originally 10/12/1964)   MAMMOGRAM  09/20/2021 (Originally 01/30/2009)   DEXA SCAN  09/20/2021 (Originally 10/13/2011)   COLONOSCOPY (Pts 45-16yrs Insurance coverage will need to be confirmed)  09/20/2021 (Originally 04/09/2012)   PNA vac Low Risk Adult (2 of 2 - PPSV23) 09/20/2021 (Originally 02/22/2016)   INFLUENZA VACCINE  11/07/2020   TETANUS/TDAP  09/02/2023   Hepatitis C Screening  Completed   HPV VACCINES  Aged Out    The following portions of the patient's history were reviewed and updated as appropriate: allergies, current medications, past family history, past medical  history, past social history, past surgical history, and problem list.  Review of Systems Pertinent items noted in HPI and remainder of comprehensive ROS otherwise negative.   Objective:    BP 138/78   Pulse 67   Temp 98.4 F (36.9 C)   Ht 5\' 8"  (1.727 m)   Wt 227 lb 8 oz (103.2 kg)   SpO2 99%   BMI 34.59 kg/m  General appearance: alert, cooperative, and no distress Head: Normocephalic, without obvious abnormality, atraumatic Eyes: conjunctivae/corneas clear. PERRL, EOM's intact. Fundi benign. Ears: normal TM's and external ear canals both ears Nose: Nares normal. Septum midline. Mucosa normal. No drainage or sinus tenderness. Throat: lips, mucosa, and tongue normal; teeth and gums normal Neck: no adenopathy, no carotid bruit, no JVD, supple, symmetrical, trachea midline, and thyroid not enlarged, symmetric, no tenderness/mass/nodules Back: symmetric, no curvature. ROM normal. No CVA tenderness. Lungs: clear to auscultation bilaterally Breasts:  pt deferred Heart: regular rate and rhythm, S1, S2 normal, no murmur, click, rub or gallop Abdomen: soft, non-tender; bowel sounds normal; no masses,  no organomegaly Pelvic: not indicated; status post hysterectomy, negative ROS Extremities: extremities normal, atraumatic, no cyanosis or edema Pulses: 2+ and symmetric Skin: Skin color, texture, turgor normal. No rashes or lesions Lymph nodes: Cervical adenopathy: normal and Supraclavicular adenopathy: normal  Neurologic: Grossly normal    Assessment:    Healthy female exam.      Plan:  -Discussed with patient most recent lab results which are essentially within normal limits or stable from prior. Lipid panel remains elevated. Patient has a history of statin intolerance and prefers to continue with dietary and lifestyle modifications.  -Continue weight loss efforts and weight loss program. -Patient has several BP medication allergies so will continue current medication regimen. BP  today stable. Advised to continue ambulatory BP monitoring and keep a log.  -Patient declined mammogram. Advised to obtain immunizations (Shingrix and Pneumovax 23) at the pharmacy per ins. UTD on colonoscopy. -Follow up in 3 months for HTN, HLD, Wt  See After Visit Summary for Counseling Recommendations

## 2020-09-20 NOTE — Patient Instructions (Addendum)
Heart-Healthy Eating Plan Heart-healthy meal planning includes: Eating less unhealthy fats. Eating more healthy fats. Making other changes in your diet. Talk with your doctor or a diet specialist (dietitian) to create an eating plan that is right for you. What is my plan? Your doctor may recommend an eating plan that includes: Total fat: ______% or less of total calories a day. Saturated fat: ______% or less of total calories a day. Cholesterol: less than _________mg a day. What are tips for following this plan? Cooking Avoid frying your food. Try to bake, boil, grill, or broil it instead. You can also reduce fat by: Removing the skin from poultry. Removing all visible fats from meats. Steaming vegetables in water or broth. Meal planning  At meals, divide your plate into four equal parts: Fill one-half of your plate with vegetables and green salads. Fill one-fourth of your plate with whole grains. Fill one-fourth of your plate with lean protein foods. Eat 4-5 servings of vegetables per day. A serving of vegetables is: 1 cup of raw or cooked vegetables. 2 cups of raw leafy greens. Eat 4-5 servings of fruit per day. A serving of fruit is: 1 medium whole fruit.  cup of dried fruit.  cup of fresh, frozen, or canned fruit.  cup of 100% fruit juice. Eat more foods that have soluble fiber. These are apples, broccoli, carrots, beans, peas, and barley. Try to get 20-30 g of fiber per day. Eat 4-5 servings of nuts, legumes, and seeds per week: 1 serving of dried beans or legumes equals  cup after being cooked. 1 serving of nuts is  cup. 1 serving of seeds equals 1 tablespoon.  General information Eat more home-cooked food. Eat less restaurant, buffet, and fast food. Limit or avoid alcohol. Limit foods that are high in starch and sugar. Avoid fried foods. Lose weight if you are overweight. Keep track of how much salt (sodium) you eat. This is important if you have high blood  pressure. Ask your doctor to tell you more about this. Try to add vegetarian meals each week. Fats Choose healthy fats. These include olive oil and canola oil, flaxseeds, walnuts, almonds, and seeds. Eat more omega-3 fats. These include salmon, mackerel, sardines, tuna, flaxseed oil, and ground flaxseeds. Try to eat fish at least 2 times each week. Check food labels. Avoid foods with trans fats or high amounts of saturated fat. Limit saturated fats. These are often found in animal products, such as meats, butter, and cream. These are also found in plant foods, such as palm oil, palm kernel oil, and coconut oil. Avoid foods with partially hydrogenated oils in them. These have trans fats. Examples are stick margarine, some tub margarines, cookies, crackers, and other baked goods. What foods can I eat? Fruits All fresh, canned (in natural juice), or frozen fruits. Vegetables Fresh or frozen vegetables (raw, steamed, roasted, or grilled). Green salads. Grains Most grains. Choose whole wheat and whole grains most of the time. Rice andpasta, including brown rice and pastas made with whole wheat. Meats and other proteins Lean, well-trimmed beef, veal, pork, and lamb. Chicken and [place] without skin. All fish and shellfish. Wild duck, rabbit, pheasant, and venison. Egg whites or low-cholesterol egg substitutes. Dried beans, peas, lentils, and tofu. Seedsand most nuts. Dairy Low-fat or nonfat cheeses, including ricotta and mozzarella. Skim or 1% milk that is liquid, powdered, or evaporated. Buttermilk that is made with low-fatmilk. Nonfat or low-fat yogurt. Fats and oils Non-hydrogenated (trans-free) margarines. Vegetable oils, including soybean, sesame,  sunflower, olive, peanut, safflower, corn, canola, and cottonseed. Salad dressings or mayonnaisemade with a vegetable oil. Beverages Mineral water. Coffee and tea. Diet carbonated beverages. Sweets and desserts Sherbet, gelatin, and fruit ice. Small  amounts of dark chocolate. Limit all sweets and desserts. Seasonings and condiments All seasonings and condiments. The items listed above may not be a complete list of foods and drinks you can eat. Contact a dietitian for more options. What foods should I avoid? Fruits Canned fruit in heavy syrup. Fruit in cream or butter sauce. Fried fruit. Limitcoconut. Vegetables Vegetables cooked in cheese, cream, or butter sauce. Fried vegetables. Grains Breads that are made with saturated or trans fats, oils, or whole milk. Croissants. Sweet rolls. Donuts. High-fat crackers,such as cheese crackers. Meats and other proteins Fatty meats, such as hot dogs, ribs, sausage, bacon, rib-eye roast or steak. High-fat deli meats, such as salami and bologna. Caviar. Domestic duck andgoose. Organ meats, such as liver. Dairy Cream, sour cream, cream cheese, and creamed cottage cheese. Whole-milk cheeses. Whole or 2% milk that is liquid, evaporated, or condensed. Whole buttermilk. Cream sauce or high-fat cheese sauce. Yogurt that is made fromwhole milk. Fats and oils Meat fat, or shortening. Cocoa butter, hydrogenated oils, palm oil, coconut oil, palm kernel oil. Solid fats and shortenings, including bacon fat, salt pork, lard, and butter. Nondairy cream substitutes. Salad dressings with cheeseor sour cream. Beverages Regular sodas and juice drinks with added sugar. Sweets and desserts Frosting. Pudding. Cookies. Cakes. Pies. Milk chocolate or white chocolate.Buttered syrups. Full-fat ice cream or ice cream drinks. The items listed above may not be a complete list of foods and drinks to avoid. Contact a dietitian for more information. Summary Heart-healthy meal planning includes eating less unhealthy fats, eating more healthy fats, and making other changes in your diet. Eat a balanced diet. This includes fruits and vegetables, low-fat or nonfat dairy, lean protein, nuts and legumes, whole grains, and heart-healthy  oils and fats. This information is not intended to replace advice given to you by your health care provider. Make sure you discuss any questions you have with your healthcare provider. Document Revised: 05/30/2017 Document Reviewed: 05/03/2017 Elsevier Patient Education  2022 [place] 65 Years and Older, Female Preventive care refers to lifestyle choices and visits with your health care provider that can promote health and wellness. This includes: A yearly physical exam. This is also called an annual wellness visit. Regular dental and eye exams. Immunizations. Screening for certain conditions. Healthy lifestyle choices, such as: Eating a healthy diet. Getting regular exercise. Not using drugs or products that contain nicotine and tobacco. Limiting alcohol use. What can I expect for my preventive care visit? Physical exam Your health care provider will check your: Height and weight. These may be used to calculate your BMI (body mass index). BMI is a measurement that tells if you are at a healthy weight. Heart rate and blood pressure. Body temperature. Skin for abnormal spots. Counseling Your health care provider may ask you questions about your: Past medical problems. Family's medical history. Alcohol, tobacco, and drug use. Emotional well-being. Home life and relationship well-being. Sexual activity. Diet, exercise, and sleep habits. History of falls. Memory and ability to understand (cognition). Work and work Statistician. Pregnancy and menstrual history. Access to firearms. What immunizations do I need?  Vaccines are usually given at various ages, according to a schedule. Your health care provider will recommend vaccines for you based on your age, medicalhistory, and lifestyle or other  factors, such as travel or where you work. What tests do I need? Blood tests Lipid and cholesterol levels. These may be checked every 5 years, or more often depending  on your overall health. Hepatitis C test. Hepatitis B test. Screening Lung cancer screening. You may have this screening every year starting at age 55 if you have a 30-pack-year history of smoking and currently smoke or have quit within the past 15 years. Colorectal cancer screening. All adults should have this screening starting at age 50 and continuing until age 75. Your health care provider may recommend screening at age 45 if you are at increased risk. You will have tests every 1-10 years, depending on your results and the type of screening test. Diabetes screening. This is done by checking your blood sugar (glucose) after you have not eaten for a while (fasting). You may have this done every 1-3 years. Mammogram. This may be done every 1-2 years. Talk with your health care provider about how often you should have regular mammograms. Abdominal aortic aneurysm (AAA) screening. You may need this if you are a current or former smoker. BRCA-related cancer screening. This may be done if you have a family history of breast, ovarian, tubal, or peritoneal cancers. Other tests STD (sexually transmitted disease) testing, if you are at risk. Bone density scan. This is done to screen for osteoporosis. You may have this done starting at age 74. Talk with your health care provider about your test results, treatment options,and if necessary, the need for more tests. Follow these instructions at home: Eating and drinking  Eat a diet that includes fresh fruits and vegetables, whole grains, lean protein, and low-fat dairy products. Limit your intake of foods with high amounts of sugar, saturated fats, and salt. Take vitamin and mineral supplements as recommended by your health care provider. Do not drink alcohol if your health care provider tells you not to drink. If you drink alcohol: Limit how much you have to 0-1 drink a day. Be aware of how much alcohol is in your drink. In the [place] one drink  equals one 12 oz bottle of beer (355 mL), one 5 oz glass of wine (148 mL), or one 1 oz glass of hard liquor (44 mL).  Lifestyle Take daily care of your teeth and gums. Brush your teeth every morning and night with fluoride toothpaste. Floss one time each day. Stay active. Exercise for at least 30 minutes 5 or more days each week. Do not use any products that contain nicotine or tobacco, such as cigarettes, e-cigarettes, and chewing tobacco. If you need help quitting, ask your health care provider. Do not use drugs. If you are sexually active, practice safe sex. Use a condom or other form of protection in order to prevent STIs (sexually transmitted infections). Talk with your health care provider about taking a low-dose aspirin or statin. Find healthy ways to cope with stress, such as: Meditation, yoga, or listening to music. Journaling. Talking to a trusted person. Spending time with friends and family. Safety Always wear your seat belt while driving or riding in a vehicle. Do not drive: If you have been drinking alcohol. Do not ride with someone who has been drinking. When you are tired or distracted. While texting. Wear a helmet and other protective equipment during sports activities. If you have firearms in your house, make sure you follow all gun safety procedures. What's next? Visit your health care provider once a year for an annual wellness visit. Ask   your health care provider how often you should have your eyes and teeth checked. Stay up to date on all vaccines. This information is not intended to replace advice given to you by your health care provider. Make sure you discuss any questions you have with your healthcare provider. Document Revised: 03/16/2020 Document Reviewed: 03/20/2018 Elsevier Patient Education  2022 [employer].

## 2020-09-28 ENCOUNTER — Ambulatory Visit (INDEPENDENT_AMBULATORY_CARE_PROVIDER_SITE_OTHER): Payer: Medicare Other | Admitting: Family Medicine

## 2020-10-13 ENCOUNTER — Ambulatory Visit (INDEPENDENT_AMBULATORY_CARE_PROVIDER_SITE_OTHER): Payer: Medicare Other | Admitting: Family Medicine

## 2020-10-13 ENCOUNTER — Other Ambulatory Visit: Payer: Self-pay

## 2020-10-13 ENCOUNTER — Encounter (INDEPENDENT_AMBULATORY_CARE_PROVIDER_SITE_OTHER): Payer: Self-pay | Admitting: Family Medicine

## 2020-10-13 VITALS — BP 106/73 | HR 80 | Temp 98.1°F | Ht 68.0 in | Wt 218.0 lb

## 2020-10-13 DIAGNOSIS — I10 Essential (primary) hypertension: Secondary | ICD-10-CM | POA: Diagnosis not present

## 2020-10-13 DIAGNOSIS — Z6835 Body mass index (BMI) 35.0-35.9, adult: Secondary | ICD-10-CM | POA: Diagnosis not present

## 2020-10-13 DIAGNOSIS — E88819 Insulin resistance, unspecified: Secondary | ICD-10-CM

## 2020-10-13 DIAGNOSIS — E8881 Metabolic syndrome: Secondary | ICD-10-CM | POA: Diagnosis not present

## 2020-10-18 NOTE — Progress Notes (Signed)
Chief Complaint:   OBESITY Doris Harris is here to discuss her progress with her obesity treatment plan along with follow-up of her obesity related diagnoses. Doris Harris is on the Category 1 Plan +100 calories and states she is following her eating plan approximately 90% of the time. Doris Harris states she is walking for 30 minutes 2 times per week.  Today's visit was #: 24 Starting weight: 234 lbs Starting date: 06/01/2019 Today's weight: 218 lbs Today's date: 10/13/2020 Total lbs lost to date: 16 lbs Total lbs lost since last in-office visit: 7 lbs  Interim History:  Doris Harris is down 7 pounds today.  She has adhered to plan very well.  She has not been exercising due to body aches.  Water intake is good.  Using fruit for snack calories.  She is getting her protein in.  Subjective:   1. Essential hypertension She feels the Cardizem is causing headaches and congestion.  She has sensitivities to multiple anti-hypertensives.  Blood pressure is very good today at 106/73.  BP Readings from Last 3 Encounters:  10/13/20 106/73  09/20/20 138/78  08/24/20 (!) 156/80   2. Insulin resistance She is not on metformin.  Denies polyphagia.  Last fasting insulin slightly elevated at 5.3.  A1c was 5.6.  Lab Results  Component Value Date   INSULIN 5.3 03/22/2020   INSULIN 6.6 11/17/2019   INSULIN 8.7 06/01/2019   Lab Results  Component Value Date   HGBA1C 5.6 09/13/2020   Assessment/Plan:   1. Essential hypertension Trial off of Cardizem.  Take blood pressure at home daily for one week.  Call PCP if blood pressure is greater than 140/90.  Continue metoprolol.   2. Insulin resistance Continue meal plan.  3. Obesity: BMI 33.15  Doris Harris is currently in the action stage of change. As such, her goal is to continue with weight loss efforts. She has agreed to the Category 1 Plan +100 calories.   Exercise goals:  Start back on exercise.  Behavioral modification strategies: planning for success.  Safaa has  agreed to follow-up with our clinic in 3 weeks.  Objective:   Blood pressure 106/73, pulse 80, temperature 98.1 F (36.7 C), height 5\' 8"  (1.727 m), weight 218 lb (98.9 kg), SpO2 97 %. Body mass index is 33.15 kg/m.  General: Cooperative, alert, well developed, in no acute distress. HEENT: Conjunctivae and lids unremarkable. Cardiovascular: Regular rhythm.  Lungs: Normal work of breathing. Neurologic: No focal deficits.   Lab Results  Component Value Date   CREATININE 0.80 09/13/2020   BUN 14 09/13/2020   NA 139 09/13/2020   K 4.1 09/13/2020   CL 100 09/13/2020   CO2 24 09/13/2020   Lab Results  Component Value Date   ALT 18 09/13/2020   AST 18 09/13/2020   ALKPHOS 113 09/13/2020   BILITOT 0.4 09/13/2020   Lab Results  Component Value Date   HGBA1C 5.6 09/13/2020   HGBA1C 5.5 03/22/2020   HGBA1C 5.5 11/17/2019   HGBA1C 5.6 06/01/2019   HGBA1C 5.7 (H) 01/12/2019   Lab Results  Component Value Date   INSULIN 5.3 03/22/2020   INSULIN 6.6 11/17/2019   INSULIN 8.7 06/01/2019   Lab Results  Component Value Date   TSH 1.780 09/13/2020   Lab Results  Component Value Date   CHOL 215 (H) 09/13/2020   HDL 74 09/13/2020   LDLCALC 129 (H) 09/13/2020   LDLDIRECT 120.2 09/05/2012   TRIG 67 09/13/2020   CHOLHDL 2.9 09/13/2020  Lab Results  Component Value Date   VD25OH 62.7 03/22/2020   VD25OH 39.5 11/17/2019   VD25OH 36.7 06/01/2019   Lab Results  Component Value Date   WBC 5.9 09/13/2020   HGB 13.6 09/13/2020   HCT 40.3 09/13/2020   MCV 93 09/13/2020   PLT 191 09/13/2020   Obesity Behavioral Intervention:   Approximately 15 minutes were spent on the discussion below.  ASK: We discussed the diagnosis of obesity with Doris Harris today and Doris Harris agreed to give [name] permission to discuss obesity behavioral modification therapy today.  ASSESS: Doris Harris has the diagnosis of obesity and her BMI today is 33.2. Doris Harris is in the action stage of change.   ADVISE: Doris Harris  was educated on the multiple health risks of obesity as well as the benefit of weight loss to improve her health. She was advised of the need for long term treatment and the importance of lifestyle modifications to improve her current health and to decrease her risk of future health problems.  AGREE: Multiple dietary modification options and treatment options were discussed and Doris Harris agreed to follow the recommendations documented in the above note.  ARRANGE: Doris Harris was educated on the importance of frequent visits to treat obesity as outlined per CMS and USPSTF guidelines and agreed to schedule her next follow up appointment today.  Attestation Statements:   Reviewed by clinician on day of visit: allergies, medications, problem list, medical history, surgical history, family history, social history, and previous encounter notes.  I, Insurance claims handler, CMA, am acting as Energy manager for [place], FNP.  I have reviewed the above documentation for accuracy and completeness, and I agree with the above. -  Jesse Sans, FNP

## 2020-10-19 ENCOUNTER — Encounter (INDEPENDENT_AMBULATORY_CARE_PROVIDER_SITE_OTHER): Payer: Self-pay | Admitting: Family Medicine

## 2020-10-25 ENCOUNTER — Other Ambulatory Visit (INDEPENDENT_AMBULATORY_CARE_PROVIDER_SITE_OTHER): Payer: Self-pay | Admitting: Family Medicine

## 2020-10-25 DIAGNOSIS — I10 Essential (primary) hypertension: Secondary | ICD-10-CM

## 2020-10-25 NOTE — Telephone Encounter (Signed)
Last OV with Dawn 

## 2020-11-03 ENCOUNTER — Other Ambulatory Visit: Payer: Self-pay

## 2020-11-03 ENCOUNTER — Ambulatory Visit (INDEPENDENT_AMBULATORY_CARE_PROVIDER_SITE_OTHER): Payer: Medicare Other | Admitting: Family Medicine

## 2020-11-03 ENCOUNTER — Encounter (INDEPENDENT_AMBULATORY_CARE_PROVIDER_SITE_OTHER): Payer: Self-pay | Admitting: Family Medicine

## 2020-11-03 VITALS — BP 140/86 | HR 63 | Temp 98.2°F | Ht 68.0 in | Wt 220.0 lb

## 2020-11-03 DIAGNOSIS — E8881 Metabolic syndrome: Secondary | ICD-10-CM

## 2020-11-03 DIAGNOSIS — Z6835 Body mass index (BMI) 35.0-35.9, adult: Secondary | ICD-10-CM | POA: Diagnosis not present

## 2020-11-03 DIAGNOSIS — I10 Essential (primary) hypertension: Secondary | ICD-10-CM | POA: Diagnosis not present

## 2020-11-03 DIAGNOSIS — E88819 Insulin resistance, unspecified: Secondary | ICD-10-CM

## 2020-11-03 MED ORDER — DILTIAZEM HCL ER COATED BEADS 300 MG PO CP24: 300.0000 mg | ORAL_CAPSULE | Freq: Every day | ORAL | 0 refills | Status: DC

## 2020-11-07 NOTE — Progress Notes (Signed)
Chief Complaint:   OBESITY Ciria is here to discuss her progress with her obesity treatment plan along with follow-up of her obesity related diagnoses. Onia is on the Category 1 Plan + 100 calories and states she is following her eating plan approximately 98% of the time. Marasia states she is walking and stretching 60 minutes 2 times per week.  Today's visit was #: 25 Starting weight: 234 lbs Starting date: 06/01/2019 Today's weight: 220 lbs Today's date: 11/03/2020 Total lbs lost to date: 14 Total lbs lost since last in-office visit: +2  Interim History: Raeley has adhered to the plan very well. She is getting the prescribed protein. Her appetite is well controlled. She is snacking on watermelon but is staying below 200 calories.  Subjective:   1. Essential hypertension Bennetta's BP's at home have been running 130's/70-80's with Cardizem and metoprolol. Her BP is well controlled generally but slightly elevated today.  BP Readings from Last 3 Encounters:  11/03/20 140/86  10/13/20 106/73  09/20/20 138/78   Lab Results  Component Value Date   CREATININE 0.80 09/13/2020   CREATININE 0.84 03/22/2020   CREATININE 0.81 11/17/2019   2. Insulin resistance Rosa denies polyphagia. Her las fasting insulin was only mildly elevated.  Lab Results  Component Value Date   INSULIN 5.3 03/22/2020   INSULIN 6.6 11/17/2019   INSULIN 8.7 06/01/2019   Lab Results  Component Value Date   HGBA1C 5.6 09/13/2020   Assessment/Plan:   1. Essential hypertension Continue metoprolol and Cardizem.  Refill- diltiazem (CARDIZEM CD) 300 MG 24 hr capsule; Take 1 capsule (300 mg total) by mouth daily.  Dispense: 90 capsule; Refill: 0  2. Insulin resistance Alima will continue to work on weight loss, exercise, and decreasing simple carbohydrates to help decrease the risk of diabetes. Jayden agreed to follow-up with [name] as directed to closely monitor her progress. Continue meal plan. Pt declined  Ozempic.  3. Obesity: BMI 33.46  Camara is currently in the action stage of change. As such, her goal is to continue with weight loss efforts. She has agreed to the Category 1 Plan + 100 calories.   Exercise goals:  Myiah will add one more short walk per week for a total of 150 minutes per week.  Behavioral modification strategies: planning for success.  Tanija has agreed to follow-up with our clinic in 3 weeks.  Objective:   Blood pressure 140/86, pulse 63, temperature 98.2 F (36.8 C), height 5\' 8"  (1.727 m), weight 220 lb (99.8 kg), SpO2 97 %. Body mass index is 33.45 kg/m.  General: Cooperative, alert, well developed, in no acute distress. HEENT: Conjunctivae and lids unremarkable. Cardiovascular: Regular rhythm.  Lungs: Normal work of breathing. Neurologic: No focal deficits.   Lab Results  Component Value Date   CREATININE 0.80 09/13/2020   BUN 14 09/13/2020   NA 139 09/13/2020   K 4.1 09/13/2020   CL 100 09/13/2020   CO2 24 09/13/2020   Lab Results  Component Value Date   ALT 18 09/13/2020   AST 18 09/13/2020   ALKPHOS 113 09/13/2020   BILITOT 0.4 09/13/2020   Lab Results  Component Value Date   HGBA1C 5.6 09/13/2020   HGBA1C 5.5 03/22/2020   HGBA1C 5.5 11/17/2019   HGBA1C 5.6 06/01/2019   HGBA1C 5.7 (H) 01/12/2019   Lab Results  Component Value Date   INSULIN 5.3 03/22/2020   INSULIN 6.6 11/17/2019   INSULIN 8.7 06/01/2019   Lab Results  Component Value  Date   TSH 1.780 09/13/2020   Lab Results  Component Value Date   CHOL 215 (H) 09/13/2020   HDL 74 09/13/2020   LDLCALC 129 (H) 09/13/2020   LDLDIRECT 120.2 09/05/2012   TRIG 67 09/13/2020   CHOLHDL 2.9 09/13/2020   Lab Results  Component Value Date   VD25OH 62.7 03/22/2020   VD25OH 39.5 11/17/2019   VD25OH 36.7 06/01/2019   Lab Results  Component Value Date   WBC 5.9 09/13/2020   HGB 13.6 09/13/2020   HCT 40.3 09/13/2020   MCV 93 09/13/2020   PLT 191 09/13/2020   No results found  for: IRON, TIBC, FERRITIN  Obesity Behavioral Intervention:   Approximately 15 minutes were spent on the discussion below.  ASK: We discussed the diagnosis of obesity with Gyselle today and Ishita agreed to give [name] permission to discuss obesity behavioral modification therapy today.  ASSESS: Brilyn has the diagnosis of obesity and her BMI today is 33.5. Lucielle is in the action stage of change.   ADVISE: Hollyanne was educated on the multiple health risks of obesity as well as the benefit of weight loss to improve her health. She was advised of the need for long term treatment and the importance of lifestyle modifications to improve her current health and to decrease her risk of future health problems.  AGREE: Multiple dietary modification options and treatment options were discussed and Margaree agreed to follow the recommendations documented in the above note.  ARRANGE: Christasia was educated on the importance of frequent visits to treat obesity as outlined per CMS and USPSTF guidelines and agreed to schedule her next follow up appointment today.  Attestation Statements:   Reviewed by clinician on day of visit: allergies, medications, problem list, medical history, surgical history, family history, social history, and previous encounter notes.  Edmund Hilda, CMA, am acting as Energy manager for [place], FNP.  I have reviewed the above documentation for accuracy and completeness, and I agree with the above. -  Jesse Sans, FNP

## 2020-11-24 ENCOUNTER — Encounter (INDEPENDENT_AMBULATORY_CARE_PROVIDER_SITE_OTHER): Payer: Self-pay | Admitting: Family Medicine

## 2020-11-24 ENCOUNTER — Ambulatory Visit (INDEPENDENT_AMBULATORY_CARE_PROVIDER_SITE_OTHER): Payer: Medicare Other | Admitting: Family Medicine

## 2020-11-24 ENCOUNTER — Other Ambulatory Visit: Payer: Self-pay

## 2020-11-24 VITALS — BP 164/87 | HR 65 | Temp 98.1°F | Ht 67.0 in | Wt 222.0 lb

## 2020-11-24 DIAGNOSIS — I10 Essential (primary) hypertension: Secondary | ICD-10-CM

## 2020-11-24 DIAGNOSIS — Z6835 Body mass index (BMI) 35.0-35.9, adult: Secondary | ICD-10-CM | POA: Diagnosis not present

## 2020-11-28 NOTE — Progress Notes (Signed)
Chief Complaint:   OBESITY Doris Harris is here to discuss her progress with her obesity treatment plan along with follow-up of her obesity related diagnoses. Doris Harris is on the Category 1 Plan plus 100 calories and states she is following her eating plan approximately 90% of the time. Doris Harris states she is [employer], bike and weights for 60 minutes 4-5 times per week.  Today's visit was #: 26 Starting weight: 234 lbs Starting date: 06/01/2019 Today's weight: 222 lbs Today's date:11/24/2020 Total lbs lost to date: 12 lbs Total lbs lost since last in-office visit: 0  Interim History: Doris Harris notes recent stress with her son and grandkids which affects her eating. She feels she may be over doing fruit - watermelon and cantaloupe. She is eating more than 200 extra calories per day.  Subjective:   1. Essential hypertension Doris Harris's blood pressure is elevated today at 164/87. She is taking Omesartan 40 mg plus Metoprolol. She stopped the Cardizem 100 mg every day. She feels Cardizem is causing right leg pain.  BP Readings from Last 3 Encounters:  11/24/20 (!) 164/87  11/03/20 140/86  10/13/20 106/73     Assessment/Plan:   1. Essential hypertension Doris Harris will continue to monitor. She will continue Omesartan and Metoprolol.   2. Obesity: BMI 33.76 Doris Harris is currently in the action stage of change. As such, her goal is to continue with weight loss efforts. She has agreed to the Category 1 Plan plus 100 calories.  I wrote down calorie count of her favorite fruits - watermelon, cantaloupe and white grapes. She will work on keeping extra calories < 200/day.  Exercise goals:  As is.  Behavioral modification strategies: decreasing simple carbohydrates.  Doris Harris has agreed to follow-up with our clinic in 3-4 weeks.  Objective:   Blood pressure (!) 164/87, pulse 65, temperature 98.1 F (36.7 C), temperature source Oral, height 5\' 7"  (1.702 m), weight 222 lb (100.7 kg), SpO2 97 %. Body mass index is  34.77 kg/m.  General: Cooperative, alert, well developed, in no acute distress. HEENT: Conjunctivae and lids unremarkable. Cardiovascular: Regular rhythm.  Lungs: Normal work of breathing. Neurologic: No focal deficits.   Lab Results  Component Value Date   CREATININE 0.80 09/13/2020   BUN 14 09/13/2020   NA 139 09/13/2020   K 4.1 09/13/2020   CL 100 09/13/2020   CO2 24 09/13/2020   Lab Results  Component Value Date   ALT 18 09/13/2020   AST 18 09/13/2020   ALKPHOS 113 09/13/2020   BILITOT 0.4 09/13/2020   Lab Results  Component Value Date   HGBA1C 5.6 09/13/2020   HGBA1C 5.5 03/22/2020   HGBA1C 5.5 11/17/2019   HGBA1C 5.6 06/01/2019   HGBA1C 5.7 (H) 01/12/2019   Lab Results  Component Value Date   INSULIN 5.3 03/22/2020   INSULIN 6.6 11/17/2019   INSULIN 8.7 06/01/2019   Lab Results  Component Value Date   TSH 1.780 09/13/2020   Lab Results  Component Value Date   CHOL 215 (H) 09/13/2020   HDL 74 09/13/2020   LDLCALC 129 (H) 09/13/2020   LDLDIRECT 120.2 09/05/2012   TRIG 67 09/13/2020   CHOLHDL 2.9 09/13/2020   Lab Results  Component Value Date   VD25OH 62.7 03/22/2020   VD25OH 39.5 11/17/2019   VD25OH 36.7 06/01/2019   Lab Results  Component Value Date   WBC 5.9 09/13/2020   HGB 13.6 09/13/2020   HCT 40.3 09/13/2020   MCV 93 09/13/2020   PLT 191 09/13/2020  No results found for: IRON, TIBC, FERRITIN  Attestation Statements:   Reviewed by clinician on day of visit: allergies, medications, problem list, medical history, surgical history, family history, social history, and previous encounter notes.  I, Jackson Latino, RMA, am acting as Energy manager for [place], FNP.   I have reviewed the above documentation for accuracy and completeness, and I agree with the above. -  Jesse Sans, FNP

## 2020-11-30 ENCOUNTER — Other Ambulatory Visit: Payer: Self-pay | Admitting: Physician Assistant

## 2020-11-30 DIAGNOSIS — I10 Essential (primary) hypertension: Secondary | ICD-10-CM

## 2020-12-20 ENCOUNTER — Other Ambulatory Visit: Payer: Self-pay

## 2020-12-20 ENCOUNTER — Ambulatory Visit (INDEPENDENT_AMBULATORY_CARE_PROVIDER_SITE_OTHER): Payer: Medicare Other | Admitting: Family Medicine

## 2020-12-20 ENCOUNTER — Encounter (INDEPENDENT_AMBULATORY_CARE_PROVIDER_SITE_OTHER): Payer: Self-pay | Admitting: Family Medicine

## 2020-12-20 VITALS — BP 166/84 | HR 64 | Temp 97.7°F | Ht 67.0 in | Wt 226.0 lb

## 2020-12-20 DIAGNOSIS — I10 Essential (primary) hypertension: Secondary | ICD-10-CM | POA: Diagnosis not present

## 2020-12-20 DIAGNOSIS — Z6835 Body mass index (BMI) 35.0-35.9, adult: Secondary | ICD-10-CM

## 2020-12-20 NOTE — Progress Notes (Signed)
Chief Complaint:   OBESITY Doris Harris is here to discuss her progress with her obesity treatment plan along with follow-up of her obesity related diagnoses. Doris Harris is on the Category 1 Plan plus 100 calories and states she is following her eating plan approximately 80% of the time. Doris Harris states she is doing the treadmill, biking and weights for 30 minutes 5 times per week.  Today's visit was #: 27 Starting weight: 234 lbs Starting date: 06/01/2019 Today's weight: 226 lbs Today's date: 12/20/2020 Total lbs lost to date: 8 lbs Total lbs lost since last in-office visit: 0  Interim History: Doris Harris sometimes skips breakfast. She is not hungry in the mornings. She is on plan at lunch and dinner. She denies intake of caloric beverages. She has cut back on fruit intake.  Subjective:   1. Essential hypertension Marcos's blood pressure is elevated today. Her blood pressure at home are mostly 130-140/70-80. She is compliant with Metoprolol and olmesartan.  BP Readings from Last 3 Encounters:  12/20/20 (!) 166/84  11/24/20 (!) 164/87  11/03/20 140/86     Assessment/Plan:   1. Essential hypertension  She will discuss with Mayer Masker, PA-C- has appt tomorrow.  2. Obesity: BMI 35.39 Doris Harris is currently in the action stage of change. As such, her goal is to continue with weight loss efforts. She has agreed to keeping a food journal and adhering to recommended goals of 1000-1100 calories and 80 grams of protein.   Exercise goals:  As is.  Behavioral modification strategies: no skipping meals.  Doris Harris has agreed to follow-up with our clinic in 3-4 weeks.  Objective:   Blood pressure (!) 166/84, pulse 64, temperature 97.7 F (36.5 C), height 5\' 7"  (1.702 m), weight 226 lb (102.5 kg), SpO2 96 %. Body mass index is 35.4 kg/m.  General: Cooperative, alert, well developed, in no acute distress. HEENT: Conjunctivae and lids unremarkable. Cardiovascular: Regular rhythm.  Lungs: Normal work  of breathing. Neurologic: No focal deficits.   Lab Results  Component Value Date   CREATININE 0.80 09/13/2020   BUN 14 09/13/2020   NA 139 09/13/2020   K 4.1 09/13/2020   CL 100 09/13/2020   CO2 24 09/13/2020   Lab Results  Component Value Date   ALT 18 09/13/2020   AST 18 09/13/2020   ALKPHOS 113 09/13/2020   BILITOT 0.4 09/13/2020   Lab Results  Component Value Date   HGBA1C 5.6 09/13/2020   HGBA1C 5.5 03/22/2020   HGBA1C 5.5 11/17/2019   HGBA1C 5.6 06/01/2019   HGBA1C 5.7 (H) 01/12/2019   Lab Results  Component Value Date   INSULIN 5.3 03/22/2020   INSULIN 6.6 11/17/2019   INSULIN 8.7 06/01/2019   Lab Results  Component Value Date   TSH 1.780 09/13/2020   Lab Results  Component Value Date   CHOL 215 (H) 09/13/2020   HDL 74 09/13/2020   LDLCALC 129 (H) 09/13/2020   LDLDIRECT 120.2 09/05/2012   TRIG 67 09/13/2020   CHOLHDL 2.9 09/13/2020   Lab Results  Component Value Date   VD25OH 62.7 03/22/2020   VD25OH 39.5 11/17/2019   VD25OH 36.7 06/01/2019   Lab Results  Component Value Date   WBC 5.9 09/13/2020   HGB 13.6 09/13/2020   HCT 40.3 09/13/2020   MCV 93 09/13/2020   PLT 191 09/13/2020   No results found for: IRON, TIBC, FERRITIN  Obesity Behavioral Intervention:   Approximately 15 minutes were spent on the discussion below.  ASK: We discussed  the diagnosis of obesity with Doris Harris today and Doris Harris agreed to give [name] permission to discuss obesity behavioral modification therapy today.  ASSESS: Doris Harris has the diagnosis of obesity and her BMI today is 35.4. Doris Harris is in the action stage of change.   ADVISE: Doris Harris was educated on the multiple health risks of obesity as well as the benefit of weight loss to improve her health. She was advised of the need for long term treatment and the importance of lifestyle modifications to improve her current health and to decrease her risk of future health problems.  AGREE: Multiple dietary modification options  and treatment options were discussed and Doris Harris agreed to follow the recommendations documented in the above note.  ARRANGE: Doris Harris was educated on the importance of frequent visits to treat obesity as outlined per CMS and USPSTF guidelines and agreed to schedule her next follow up appointment today.  Attestation Statements:   Reviewed by clinician on day of visit: allergies, medications, problem list, medical history, surgical history, family history, social history, and previous encounter notes.  I, Jackson Latino, RMA, am acting as Energy manager for [place], FNP.   I have reviewed the above documentation for accuracy and completeness, and I agree with the above. -  Jesse Sans, FNP

## 2020-12-21 ENCOUNTER — Ambulatory Visit (INDEPENDENT_AMBULATORY_CARE_PROVIDER_SITE_OTHER): Payer: Medicare Other | Admitting: Physician Assistant

## 2020-12-21 ENCOUNTER — Encounter: Payer: Self-pay | Admitting: Physician Assistant

## 2020-12-21 VITALS — BP 132/88 | HR 70 | Temp 97.7°F | Ht 68.0 in | Wt 229.0 lb

## 2020-12-21 DIAGNOSIS — E669 Obesity, unspecified: Secondary | ICD-10-CM | POA: Diagnosis not present

## 2020-12-21 DIAGNOSIS — E7849 Other hyperlipidemia: Secondary | ICD-10-CM | POA: Diagnosis not present

## 2020-12-21 DIAGNOSIS — R7303 Prediabetes: Secondary | ICD-10-CM | POA: Diagnosis not present

## 2020-12-21 DIAGNOSIS — I10 Essential (primary) hypertension: Secondary | ICD-10-CM | POA: Diagnosis not present

## 2020-12-21 DIAGNOSIS — Z6834 Body mass index (BMI) 34.0-34.9, adult: Secondary | ICD-10-CM

## 2020-12-21 MED ORDER — HYDROCHLOROTHIAZIDE 12.5 MG PO TABS: ORAL_TABLET | ORAL | 0 refills | Status: DC

## 2020-12-21 NOTE — Patient Instructions (Signed)

## 2020-12-21 NOTE — Assessment & Plan Note (Signed)
-  Patient has intolerance to atorvastatin and hesitant to trial other statin medication. -Continue weight loss efforts, physical activity, and low fat diet. -Will continue to monitor.

## 2020-12-21 NOTE — Progress Notes (Signed)
Established Patient Office Visit  Subjective:  Patient ID: CODI FOLKERTS, female    DOB: Apr 29, 1945  Age: 75 y.o. MRN: [id number]  CC:  Chief Complaint  Patient presents with   Follow-up   Hypertension   Hyperlipidemia    HPI Doris Harris presents for follow up on hypertension and hyperlipidemia.  HTN: Pt denies chest pain, palpitations, dizziness, shortness of breath or lower extremity swelling. Taking medication as directed without side effects. Checks BP at home and readings range from 182-993Z/16-96V with few systolic readings in 893-810F. Patient discontinued diltiazem due to wheezing which has resolved. Pt follows a low salt diet.  HLD: Pt has hx of statin intolerance. Patient is followed by [facility] and follows a specific meal plan. Continues to go to the [facility] 4-5 times/wk for at least an hr.  Prediabetes: Denies increased thirst or urination from baseline. Monitoring carbohydrate and glucose intake.  Past Medical History:  Diagnosis Date   Back pain    Hypertension    IFG (impaired fasting glucose)    x1   Joint pain    Obesity    Prediabetes    Vitamin D deficiency     Past Surgical History:  Procedure Laterality Date   MM BREAST STEREO BX*L*R/S     TOTAL ABDOMINAL HYSTERECTOMY  1997   fibroid   uterine polyps     WRIST FRACTURE SURGERY  2000    Family History  Problem Relation Age of Onset   Hypertension Mother    Hyperlipidemia Mother    Heart disease Mother    Hypertension Father    Hypertension Brother    Diabetes Maternal Grandmother    Coronary artery disease Other    Hypertension Other    Asthma Other    Hypertension Brother    Hypertension Sister    Healthy Son    Hypertension Sister    Healthy Sister    Hypertension Brother    Healthy Brother    Healthy Brother    Healthy Brother    Heart attack Brother     Social History   Socioeconomic History   Marital status: Single    Spouse name: Not on file   Number  of children: Not on file   Years of education: Not on file   Highest education level: Not on file  Occupational History   Not on file  Tobacco Use   Smoking status: Never   Smokeless tobacco: Never  Vaping Use   Vaping Use: Never used  Substance and Sexual Activity   Alcohol use: No   Drug use: No   Sexual activity: Never  Other Topics Concern   Not on file  Social History Narrative   Retired 12 13    Exercise  Neg tad    Never smoker   Sleep 8 or more hours   hh of 1  Attends 83+ yo days    No pets       Social Determinants of Radio broadcast assistant Strain: Not on file  Food Insecurity: Not on file  Transportation Needs: Not on file  Physical Activity: Not on file  Stress: Not on file  Social Connections: Not on file  Intimate Partner Violence: Not on file    Outpatient Medications Prior to Visit  Medication Sig Dispense Refill   metoprolol succinate (TOPROL-XL) 100 MG 24 hr tablet TAKE 1 TABLET BY MOUTH  DAILY WITH OR IMMEDIATELY  FOLLOWING A MEAL 90 tablet 0  VITAMIN D PO Take 5,000 Units by mouth.     olmesartan (BENICAR) 40 MG tablet Take 40 mg by mouth daily.     No facility-administered medications prior to visit.    Allergies  Allergen Reactions   Amlodipine Nausea And Vomiting   Hctz [Hydrochlorothiazide] Other (See Comments)    Freq urination- 10+ times/ day   Ibuprofen     REACTION: unspecified   Lipitor [Atorvastatin Calcium] Other (See Comments)    Joint pain   Lisinopril Cough   Benazepril Rash   Valsartan-Hydrochlorothiazide Rash    ROS Review of Systems A fourteen system review of systems was performed and found to be positive as per HPI.   Objective:    Physical Exam General:  Well Developed, well nourished, appropriate for stated age.  Neuro:  Alert and oriented,  extra-ocular muscles intact  HEENT:  Normocephalic, atraumatic, neck supple  Skin:  no gross rash, warm, pink. Cardiac:  RRR, S1 S2 Respiratory:  CTA B/L, Not  using accessory muscles, speaking in full sentences- unlabored. Vascular:  Ext warm, no cyanosis apprec.; cap RF less 2 sec. No edema Psych:  No HI/SI, judgement and insight good, Euthymic mood. Full Affect.  BP 132/88   Pulse 70   Temp 97.7 F (36.5 C)   Ht _0  (1.727 m)   Wt 229 lb (103.9 kg)   SpO2 99%   BMI 34.82 kg/m  Wt Readings from Last 3 Encounters:  12/21/20 229 lb (103.9 kg)  12/20/20 226 lb (102.5 kg)  11/24/20 222 lb (100.7 kg)     Health Maintenance Due  Topic Date Due   COVID-19 Vaccine (3 - Moderna risk series) 02/04/2020   INFLUENZA VACCINE  Never done    There are no preventive care reminders to display for this patient.  Lab Results  Component Value Date   TSH 1.780 09/13/2020   Lab Results  Component Value Date   WBC 5.9 09/13/2020   HGB 13.6 09/13/2020   HCT 40.3 09/13/2020   MCV 93 09/13/2020   PLT 191 09/13/2020   Lab Results  Component Value Date   NA 139 09/13/2020   K 4.1 09/13/2020   CO2 24 09/13/2020   GLUCOSE 98 09/13/2020   BUN 14 09/13/2020   CREATININE 0.80 09/13/2020   BILITOT 0.4 09/13/2020   ALKPHOS 113 09/13/2020   AST 18 09/13/2020   ALT 18 09/13/2020   PROT 7.4 09/13/2020   ALBUMIN 3.8 09/13/2020   CALCIUM 9.2 09/13/2020   EGFR 78 09/13/2020   GFR 81.03 02/22/2015   Lab Results  Component Value Date   CHOL 215 (H) 09/13/2020   Lab Results  Component Value Date   HDL 74 09/13/2020   Lab Results  Component Value Date   LDLCALC 129 (H) 09/13/2020   Lab Results  Component Value Date   TRIG 67 09/13/2020   Lab Results  Component Value Date   CHOLHDL 2.9 09/13/2020   Lab Results  Component Value Date   HGBA1C 5.6 09/13/2020      Assessment & Plan:   Problem List Items Addressed This Visit       Cardiovascular and Mediastinum   Essential hypertension - Primary (Chronic)    -BP initially elevated, BP recheck improved and stable. Patient has intolerance to numerous antihypertensive medications.  Patient will trial HCTZ every other day. If BP consistently >135/80 then recommend to consider hydralazine. Patient verbalized understanding.  -Continue current medication regimen. -Continue weight loss efforts and low sodium  diet. -Will continue to monitor.      Relevant Medications   hydrochlorothiazide (HYDRODIURIL) 12.5 MG tablet     Other   Prediabetes (Chronic)    -Last A1c stable at 5.6, follow low carbohydrate and glucose diet.  -Will continue to monitor.      Other hyperlipidemia    -Patient has intolerance to atorvastatin and hesitant to trial other statin medication. -Continue weight loss efforts, physical activity, and low fat diet. -Will continue to monitor.      Relevant Medications   hydrochlorothiazide (HYDRODIURIL) 12.5 MG tablet   Other Visit Diagnoses     Class 1 obesity with serious comorbidity and body mass index (BMI) of 34.0 to 34.9 in adult, unspecified obesity type          Class 1 obesity with serious comorbidity and body mass index (BMI) of 34.0-34.9 in adult, unspecified obesity type: -Followed by [facility]. -Associated with hypertension, hyperlipidemia and prediabetes. -Recommend to continue weight loss efforts with dietary and lifestyle changes.  Meds ordered this encounter  Medications   hydrochlorothiazide (HYDRODIURIL) 12.5 MG tablet    Sig: Take 1 tablet by mouth every other day.    Dispense:  48 tablet    Refill:  0    Order Specific Question:   Supervising Provider    Answer:   Beatrice Lecher D [[id number]    Follow-up: Return in about 3 months (around 03/22/2021) for HTN, HLD.    Lorrene Reid, PA-C

## 2020-12-21 NOTE — Assessment & Plan Note (Signed)
-  BP initially elevated, BP recheck improved and stable. Patient has intolerance to numerous antihypertensive medications. Patient will trial HCTZ every other day. If BP consistently >135/80 then recommend to consider hydralazine. Patient verbalized understanding.  -Continue current medication regimen. -Continue weight loss efforts and low sodium diet. -Will continue to monitor.

## 2020-12-21 NOTE — Assessment & Plan Note (Signed)
-  Last A1c stable at 5.6, follow low carbohydrate and glucose diet.  -Will continue to monitor.

## 2020-12-29 ENCOUNTER — Telehealth: Payer: Self-pay | Admitting: Physician Assistant

## 2020-12-29 NOTE — Telephone Encounter (Signed)
LVM for pt to call the office to schedule AWV. Please schedule this appt if pt calls the office   Thanks  

## 2021-01-10 ENCOUNTER — Ambulatory Visit (INDEPENDENT_AMBULATORY_CARE_PROVIDER_SITE_OTHER): Payer: Medicare Other | Admitting: Family Medicine

## 2021-01-22 ENCOUNTER — Other Ambulatory Visit: Payer: Self-pay | Admitting: Physician Assistant

## 2021-01-31 ENCOUNTER — Ambulatory Visit (INDEPENDENT_AMBULATORY_CARE_PROVIDER_SITE_OTHER): Payer: Medicare Other | Admitting: Family Medicine

## 2021-01-31 ENCOUNTER — Encounter (INDEPENDENT_AMBULATORY_CARE_PROVIDER_SITE_OTHER): Payer: Self-pay | Admitting: Family Medicine

## 2021-01-31 ENCOUNTER — Other Ambulatory Visit: Payer: Self-pay

## 2021-01-31 VITALS — BP 156/90 | HR 78 | Temp 98.7°F | Ht 67.0 in | Wt 221.0 lb

## 2021-01-31 DIAGNOSIS — I10 Essential (primary) hypertension: Secondary | ICD-10-CM | POA: Diagnosis not present

## 2021-01-31 DIAGNOSIS — Z6835 Body mass index (BMI) 35.0-35.9, adult: Secondary | ICD-10-CM

## 2021-01-31 NOTE — Progress Notes (Signed)
Chief Complaint:   OBESITY Doris Harris is here to discuss her progress with her obesity treatment plan along with follow-up of her obesity related diagnoses. Doris Harris is on keeping a food journal and adhering to recommended goals of 1000-1100 calories and 80 grams of protein and states she is following her eating plan approximately 90% of the time. Doris Harris states she is using the elliptical, treadmill, and weights for 60 minutes 4-5 times per week.  Today's visit was #: 28 Starting weight: 234 lbs Starting date: 06/01/2019 Today's weight: 221 lbs Today's date: 01/31/2021 Total lbs lost to date: 13 lbs Total lbs lost since last in-office visit: 5 lbs  Interim History: Doris Harris feels she adhered to plan very well over the past few weeks.  She has also increased her exercise.  Hunger is well controlled.  She feels her protein intake is better than it was.  She is meeting her protein goal.  Subjective:   1. Essential hypertension BP elevated today. Home blood pressures running 130s-140s/70s-80s. HCTZ added (every other day) by PCP and she is tolerating this well.  BP Readings from Last 3 Encounters:  01/31/21 (!) 156/90  12/21/20 132/88  12/20/20 (!) 166/84   Assessment/Plan:   1. Essential hypertension Follow-up with PCP.  2. Obesity: BMI 34.61  Doris Harris is currently in the action stage of change. As such, her goal is to continue with weight loss efforts. She has agreed to keeping a food journal and adhering to recommended goals of 1000-1100 calories and 80 grams of protein.   Exercise goals:  As is.  Behavioral modification strategies: planning for success.  Doris Harris has agreed to follow-up with our clinic in 3-4 weeks.  Objective:   Blood pressure (!) 156/90, pulse 78, temperature 98.7 F (37.1 C), height 5\' 7"  (1.702 m), weight 221 lb (100.2 kg), SpO2 97 %. Body mass index is 34.61 kg/m.  General: Cooperative, alert, well developed, in no acute distress. HEENT: Conjunctivae and  lids unremarkable. Cardiovascular: Regular rhythm.  Lungs: Normal work of breathing. Neurologic: No focal deficits.   Lab Results  Component Value Date   CREATININE 0.80 09/13/2020   BUN 14 09/13/2020   NA 139 09/13/2020   K 4.1 09/13/2020   CL 100 09/13/2020   CO2 24 09/13/2020   Lab Results  Component Value Date   ALT 18 09/13/2020   AST 18 09/13/2020   ALKPHOS 113 09/13/2020   BILITOT 0.4 09/13/2020   Lab Results  Component Value Date   HGBA1C 5.6 09/13/2020   HGBA1C 5.5 03/22/2020   HGBA1C 5.5 11/17/2019   HGBA1C 5.6 06/01/2019   HGBA1C 5.7 (H) 01/12/2019   Lab Results  Component Value Date   INSULIN 5.3 03/22/2020   INSULIN 6.6 11/17/2019   INSULIN 8.7 06/01/2019   Lab Results  Component Value Date   TSH 1.780 09/13/2020   Lab Results  Component Value Date   CHOL 215 (H) 09/13/2020   HDL 74 09/13/2020   LDLCALC 129 (H) 09/13/2020   LDLDIRECT 120.2 09/05/2012   TRIG 67 09/13/2020   CHOLHDL 2.9 09/13/2020   Lab Results  Component Value Date   VD25OH 62.7 03/22/2020   VD25OH 39.5 11/17/2019   VD25OH 36.7 06/01/2019   Lab Results  Component Value Date   WBC 5.9 09/13/2020   HGB 13.6 09/13/2020   HCT 40.3 09/13/2020   MCV 93 09/13/2020   PLT 191 09/13/2020   Attestation Statements:   Reviewed by clinician on day of visit: allergies, medications,  problem list, medical history, surgical history, family history, social history, and previous encounter notes.  I, Insurance claims handler, CMA, am acting as Energy manager for [place], FNP.  I have reviewed the above documentation for accuracy and completeness, and I agree with the above. -  Jesse Sans, FNP

## 2021-02-20 ENCOUNTER — Encounter (INDEPENDENT_AMBULATORY_CARE_PROVIDER_SITE_OTHER): Payer: Self-pay | Admitting: Bariatrics

## 2021-02-20 ENCOUNTER — Ambulatory Visit (INDEPENDENT_AMBULATORY_CARE_PROVIDER_SITE_OTHER): Payer: Medicare Other | Admitting: Bariatrics

## 2021-02-20 ENCOUNTER — Other Ambulatory Visit: Payer: Self-pay

## 2021-02-20 VITALS — BP 148/86 | HR 77 | Temp 98.1°F | Ht 67.0 in | Wt 222.0 lb

## 2021-02-20 DIAGNOSIS — Z6835 Body mass index (BMI) 35.0-35.9, adult: Secondary | ICD-10-CM

## 2021-02-20 DIAGNOSIS — E7849 Other hyperlipidemia: Secondary | ICD-10-CM

## 2021-02-20 DIAGNOSIS — E8881 Metabolic syndrome: Secondary | ICD-10-CM | POA: Diagnosis not present

## 2021-02-20 DIAGNOSIS — I10 Essential (primary) hypertension: Secondary | ICD-10-CM

## 2021-02-20 NOTE — Progress Notes (Signed)
Chief Complaint:   OBESITY Doris Harris is here to discuss her progress with her obesity treatment plan along with follow-up of her obesity related diagnoses. Doris Harris is on the Category 1 Plan and keeping a food journal and adhering to recommended goals of 1000-1100 calories and 80 grams of protein and states she is following her eating plan approximately 90% of the time. Doris Harris states she is doing cardio and walking for 60 minutes 4 times per week.  Today's visit was #: 29 Starting weight: 234 lbs Starting date: 06/01/2019 Today's weight: 222 lbs Today's date: 02/20/2021 Total lbs lost to date: 12 lbs Total lbs lost since last in-office visit: 0  Interim History: Doris Harris is up 1 lb since her last visit. She is getting adequate water.  Subjective:   1. Other hyperlipidemia Doris Harris is currently not on medications.  2. Insulin resistance Doris Harris is not on medications currently.  3. Essential hypertension Doris Harris is taking blood pressure medicine but did not take it today.  Assessment/Plan:   1. Other hyperlipidemia Cardiovascular risk and specific lipid/LDL goals reviewed.  We discussed several lifestyle modifications today and Doris Harris will continue to work on diet, exercise and weight loss efforts.Doris Harris will decrease carbohydrates and have zero trans fats. She will have appropriated saturated fats. She will increase PUFAs and MUFAs.  Orders and follow up as documented in patient record.   Counseling Intensive lifestyle modifications are the first line treatment for this issue. Dietary changes: Increase soluble fiber. Decrease simple carbohydrates. Exercise changes: Moderate to vigorous-intensity aerobic activity 150 minutes per week if tolerated. Lipid-lowering medications: see documented in medical record.   2. Insulin resistance Doris Harris will continue to work on weight loss, decreasing simple carbohydrates to help decrease the risk of diabetes and continue exercise. Doris Harris agreed to follow-up  with [name] as directed to closely monitor her progress.  3. Essential hypertension Doris Harris will check her blood pressure in the morning. She will take her blood pressure medicine consistently. She is working on healthy weight loss and exercise to improve blood pressure control. We will watch for signs of hypotension as she continues her lifestyle modifications.  4. Obesity: BMI 34.8 Doris Harris is currently in the action stage of change. As such, her goal is to continue with weight loss efforts. She has agreed to keeping a food journal and adhering to recommended goals of 1000-1100 calories and 80 grams of protein.   Doris Harris will continue meal planning and she will continue intentional eating. Strategies for the holidays were provided.  Exercise goals:  As is.  Behavioral modification strategies: increasing lean protein intake, decreasing simple carbohydrates, increasing vegetables, increasing water intake, decreasing eating out, no skipping meals, meal planning and cooking strategies, keeping healthy foods in the home, and planning for success.  Doris Harris has agreed to follow-up with our clinic in 3 weeks with Doris Salvage, FNP. She was informed of the importance of frequent follow-up visits to maximize her success with intensive lifestyle modifications for her multiple health conditions.   Objective:   Blood pressure (!) 148/86, pulse 77, temperature 98.1 F (36.7 C), height 5\' 7"  (1.702 m), weight 222 lb (100.7 kg), SpO2 94 %. Body mass index is 34.77 kg/m.  General: Cooperative, alert, well developed, in no acute distress. HEENT: Conjunctivae and lids unremarkable. Cardiovascular: Regular rhythm.  Lungs: Normal work of breathing. Neurologic: No focal deficits.   Lab Results  Component Value Date   CREATININE 0.80 09/13/2020   BUN 14 09/13/2020   NA 139 09/13/2020  K 4.1 09/13/2020   CL 100 09/13/2020   CO2 24 09/13/2020   Lab Results  Component Value Date   ALT 18 09/13/2020   AST 18  09/13/2020   ALKPHOS 113 09/13/2020   BILITOT 0.4 09/13/2020   Lab Results  Component Value Date   HGBA1C 5.6 09/13/2020   HGBA1C 5.5 03/22/2020   HGBA1C 5.5 11/17/2019   HGBA1C 5.6 06/01/2019   HGBA1C 5.7 (H) 01/12/2019   Lab Results  Component Value Date   INSULIN 5.3 03/22/2020   INSULIN 6.6 11/17/2019   INSULIN 8.7 06/01/2019   Lab Results  Component Value Date   TSH 1.780 09/13/2020   Lab Results  Component Value Date   CHOL 215 (H) 09/13/2020   HDL 74 09/13/2020   LDLCALC 129 (H) 09/13/2020   LDLDIRECT 120.2 09/05/2012   TRIG 67 09/13/2020   CHOLHDL 2.9 09/13/2020   Lab Results  Component Value Date   VD25OH 62.7 03/22/2020   VD25OH 39.5 11/17/2019   VD25OH 36.7 06/01/2019   Lab Results  Component Value Date   WBC 5.9 09/13/2020   HGB 13.6 09/13/2020   HCT 40.3 09/13/2020   MCV 93 09/13/2020   PLT 191 09/13/2020   No results found for: IRON, TIBC, FERRITIN  Attestation Statements:   Reviewed by clinician on day of visit: allergies, medications, problem list, medical history, surgical history, family history, social history, and previous encounter notes.  I, Jackson Latino, RMA, am acting as Energy manager for [employer], DO.   I have reviewed the above documentation for accuracy and completeness, and I agree with the above. Corinna Capra, DO

## 2021-02-21 ENCOUNTER — Other Ambulatory Visit: Payer: Self-pay | Admitting: Physician Assistant

## 2021-02-27 ENCOUNTER — Other Ambulatory Visit: Payer: Self-pay | Admitting: Physician Assistant

## 2021-02-27 DIAGNOSIS — I10 Essential (primary) hypertension: Secondary | ICD-10-CM

## 2021-03-14 ENCOUNTER — Encounter (INDEPENDENT_AMBULATORY_CARE_PROVIDER_SITE_OTHER): Payer: Self-pay | Admitting: Family Medicine

## 2021-03-14 ENCOUNTER — Ambulatory Visit (INDEPENDENT_AMBULATORY_CARE_PROVIDER_SITE_OTHER): Payer: Medicare Other | Admitting: Family Medicine

## 2021-03-14 ENCOUNTER — Other Ambulatory Visit: Payer: Self-pay

## 2021-03-14 VITALS — BP 153/84 | HR 74 | Temp 98.4°F | Ht 67.0 in | Wt 220.0 lb

## 2021-03-14 DIAGNOSIS — I10 Essential (primary) hypertension: Secondary | ICD-10-CM

## 2021-03-14 DIAGNOSIS — Z6835 Body mass index (BMI) 35.0-35.9, adult: Secondary | ICD-10-CM

## 2021-03-14 NOTE — Progress Notes (Signed)
Chief Complaint:   OBESITY Doris Harris is here to discuss her progress with her obesity treatment plan along with follow-up of her obesity related diagnoses. Doris Harris is on the Category 1 Plan and keeping a food journal and adhering to recommended goals of 1000-1100 calories and 80 grams of protein and states she is following her eating plan approximately 90% of the time. Doris Harris states she is using the elliptical, bike, weights and treadmill for 60 minutes 3 times per week.  Today's visit was #: 30 Starting weight: 234 lbs Starting date: 06/01/2019 Today's weight: 220 lbs Today's date: 03/14/2021 Total lbs lost to date: 14 lbs Total lbs lost since last in-office visit: 2 lbs  Interim History: Doris Harris adhered to plan well over the holidays. She is mostly following Category 1 rater than journaling.  She says she has been following her plan closely. Her protein intake is good. Her hunger is controlled.  Subjective:   1. Essential hypertension [place]'s blood pressure is not controlled. Her blood pressure is 153/84 today. She denies headaches, nosebleeds, chest pain or shortness of breath. She is currently on olmesartan, HCTZ and Metoprolol. She is tolerating HCTZ which has caused very frequent urination in the past.   BP Readings from Last 3 Encounters:  03/14/21 (!) 153/84  02/20/21 (!) 148/86  01/31/21 (!) 156/90     Assessment/Plan:   1. Essential hypertension Doris Harris will follow up with primary care provider on December 19 th for Hypertension management. She is working on healthy weight loss and exercise to improve blood pressure control. We will watch for signs of hypotension as she continues her lifestyle modifications.  2. Obesity: BMI 34.45 Doris Harris is currently in the action stage of change. As such, her goal is to continue with weight loss efforts. She has agreed to the Category 1 Plan or keeping a food journal and adhering to recommended goals of 1000-1100 calories and 80 grams of protein  daily.   Doris Harris's niece is living with her temporarily.  Exercise goals:  As is.  Behavioral modification strategies: planning for success.  Doris Harris has agreed to follow-up with our clinic in 4-5 weeks.  Objective:   Blood pressure (!) 153/84, pulse 74, temperature 98.4 F (36.9 C), height 5\' 7"  (1.702 m), weight 220 lb (99.8 kg), SpO2 96 %. Body mass index is 34.46 kg/m.  General: Cooperative, alert, well developed, in no acute distress. HEENT: Conjunctivae and lids unremarkable. Cardiovascular: Regular rhythm.  Lungs: Normal work of breathing. Neurologic: No focal deficits.   Lab Results  Component Value Date   CREATININE 0.80 09/13/2020   BUN 14 09/13/2020   NA 139 09/13/2020   K 4.1 09/13/2020   CL 100 09/13/2020   CO2 24 09/13/2020   Lab Results  Component Value Date   ALT 18 09/13/2020   AST 18 09/13/2020   ALKPHOS 113 09/13/2020   BILITOT 0.4 09/13/2020   Lab Results  Component Value Date   HGBA1C 5.6 09/13/2020   HGBA1C 5.5 03/22/2020   HGBA1C 5.5 11/17/2019   HGBA1C 5.6 06/01/2019   HGBA1C 5.7 (H) 01/12/2019   Lab Results  Component Value Date   INSULIN 5.3 03/22/2020   INSULIN 6.6 11/17/2019   INSULIN 8.7 06/01/2019   Lab Results  Component Value Date   TSH 1.780 09/13/2020   Lab Results  Component Value Date   CHOL 215 (H) 09/13/2020   HDL 74 09/13/2020   LDLCALC 129 (H) 09/13/2020   LDLDIRECT 120.2 09/05/2012   TRIG 67  09/13/2020   CHOLHDL 2.9 09/13/2020   Lab Results  Component Value Date   VD25OH 62.7 03/22/2020   VD25OH 39.5 11/17/2019   VD25OH 36.7 06/01/2019   Lab Results  Component Value Date   WBC 5.9 09/13/2020   HGB 13.6 09/13/2020   HCT 40.3 09/13/2020   MCV 93 09/13/2020   PLT 191 09/13/2020   No results found for: IRON, TIBC, FERRITIN  Attestation Statements:   Reviewed by clinician on day of visit: allergies, medications, problem list, medical history, surgical history, family history, social history, and  previous encounter notes.  I, Jackson Latino, RMA, am acting as Energy manager for [place], FNP.   I have reviewed the above documentation for accuracy and completeness, and I agree with the above. -  Jesse Sans, FNP

## 2021-03-27 ENCOUNTER — Other Ambulatory Visit: Payer: Self-pay

## 2021-03-27 ENCOUNTER — Encounter: Payer: Self-pay | Admitting: Physician Assistant

## 2021-03-27 ENCOUNTER — Ambulatory Visit (INDEPENDENT_AMBULATORY_CARE_PROVIDER_SITE_OTHER): Payer: Medicare Other | Admitting: Physician Assistant

## 2021-03-27 VITALS — BP 130/84 | HR 64 | Temp 97.4°F | Ht 67.0 in | Wt 226.0 lb

## 2021-03-27 DIAGNOSIS — I10 Essential (primary) hypertension: Secondary | ICD-10-CM | POA: Diagnosis not present

## 2021-03-27 DIAGNOSIS — Z6835 Body mass index (BMI) 35.0-35.9, adult: Secondary | ICD-10-CM | POA: Diagnosis not present

## 2021-03-27 DIAGNOSIS — E7849 Other hyperlipidemia: Secondary | ICD-10-CM

## 2021-03-27 MED ORDER — HYDRALAZINE HCL 10 MG PO TABS: 10.0000 mg | ORAL_TABLET | Freq: Three times a day (TID) | ORAL | 0 refills | Status: DC

## 2021-03-27 MED ORDER — OLMESARTAN MEDOXOMIL 40 MG PO TABS: 40.0000 mg | ORAL_TABLET | Freq: Every day | ORAL | 1 refills | Status: DC

## 2021-03-27 MED ORDER — METOPROLOL SUCCINATE ER 100 MG PO TB24: ORAL_TABLET | ORAL | 1 refills | Status: DC

## 2021-03-27 NOTE — Progress Notes (Signed)
Established Patient Office Visit  Subjective:  Patient ID: Doris Harris, female    DOB: 05-13-1945  Age: 75 y.o. MRN: [id number]  CC:  Chief Complaint  Patient presents with   Follow-up   Hypertension   Hyperlipidemia    HPI Doris Harris presents for follow up on hypertension and hyperlipidemia.  HTN: Pt denies chest pain, palpitations, dizziness or lower extremity swelling. Taking medications as directed without side problems. Checks BP at home, reports her blood pressure yesterday was 140/80. Patient reports was initially on Lisinopril which helped control her blood pressure but had to stop medication due to cough, and since then has been struggling with controlling blood pressure. Patient has tried different blood pressure medications with most recent diltiazem which she had to stop due to wheezing/leg pain. States in the past was on Losartan and cannot remember why medication was changed, feels like it worked better than Olmesartan.  HLD: Pt unable to tolerate statin therapy due to myalgias/arthralgia. Continues weight loss efforts with following her diet meal plan and exercise regimen at the [facility].    Past Medical History:  Diagnosis Date   Back pain    Hypertension    IFG (impaired fasting glucose)    x1   Joint pain    Obesity    Prediabetes    Vitamin D deficiency     Past Surgical History:  Procedure Laterality Date   MM BREAST STEREO BX*L*R/S     TOTAL ABDOMINAL HYSTERECTOMY  1997   fibroid   uterine polyps     WRIST FRACTURE SURGERY  2000    Family History  Problem Relation Age of Onset   Hypertension Mother    Hyperlipidemia Mother    Heart disease Mother    Hypertension Father    Hypertension Brother    Diabetes Maternal Grandmother    Coronary artery disease Other    Hypertension Other    Asthma Other    Hypertension Brother    Hypertension Sister    Healthy Son    Hypertension Sister    Healthy Sister    Hypertension Brother    Healthy  Brother    Healthy Brother    Healthy Brother    Heart attack Brother     Social History   Socioeconomic History   Marital status: Single    Spouse name: Not on file   Number of children: Not on file   Years of education: Not on file   Highest education level: Not on file  Occupational History   Not on file  Tobacco Use   Smoking status: Never   Smokeless tobacco: Never  Vaping Use   Vaping Use: Never used  Substance and Sexual Activity   Alcohol use: No   Drug use: No   Sexual activity: Never  Other Topics Concern   Not on file  Social History Narrative   Retired 12 13    Exercise  Neg tad    Never smoker   Sleep 8 or more hours   hh of 1  Attends 39+ yo days    No pets       Social Determinants of Radio broadcast assistant Strain: Not on file  Food Insecurity: Not on file  Transportation Needs: Not on file  Physical Activity: Not on file  Stress: Not on file  Social Connections: Not on file  Intimate Partner Violence: Not on file    Outpatient Medications Prior to Visit  Medication Sig Dispense  Refill   hydrochlorothiazide (HYDRODIURIL) 12.5 MG tablet TAKE 1 TABLET BY MOUTH  EVERY OTHER DAY 45 tablet 1   VITAMIN D PO Take 5,000 Units by mouth.     metoprolol succinate (TOPROL-XL) 100 MG 24 hr tablet TAKE 1 TABLET BY MOUTH  DAILY WITH OR IMMEDIATELY  FOLLOWING A MEAL 90 tablet 0   olmesartan (BENICAR) 40 MG tablet TAKE 1 TABLET BY MOUTH  DAILY 90 tablet 0   No facility-administered medications prior to visit.    Allergies  Allergen Reactions   Amlodipine Nausea And Vomiting   Hctz [Hydrochlorothiazide] Other (See Comments)    Freq urination- 10+ times/ day   Ibuprofen     REACTION: unspecified   Lipitor [Atorvastatin Calcium] Other (See Comments)    Joint pain   Lisinopril Cough   Benazepril Rash   Valsartan-Hydrochlorothiazide Rash    ROS Review of Systems Review of Systems:  A fourteen system review of systems was performed and found to  be positive as per HPI.  Objective:    Physical Exam General:  Well Developed, well nourished, appropriate for stated age.  Neuro:  Alert and oriented,  extra-ocular muscles intact  HEENT:  Normocephalic, atraumatic, neck supple Skin:  no gross rash, warm, pink. Cardiac:  RRR, S1 S2 Respiratory:  CTA B/L w/o wheezing, crackles or rales. Vascular:  Ext warm, no cyanosis apprec.; cap RF less 2 sec. Psych:  No HI/SI, judgement and insight good, Euthymic mood. Full Affect.  BP 130/84    Pulse 64    Temp (!) 97.4 F (36.3 C)    Ht _0  (1.702 m)    Wt 226 lb (102.5 kg)    SpO2 99%    BMI 35.40 kg/m  Wt Readings from Last 3 Encounters:  03/27/21 226 lb (102.5 kg)  03/14/21 220 lb (99.8 kg)  02/20/21 222 lb (100.7 kg)     Health Maintenance Due  Topic Date Due   Zoster Vaccines- Shingrix (1 of 2) Never done   Pneumonia Vaccine 18+ Years old (2 - PPSV23 if available, else PCV20) 02/22/2016   COVID-19 Vaccine (3 - Moderna risk series) 02/04/2020   INFLUENZA VACCINE  Never done    There are no preventive care reminders to display for this patient.  Lab Results  Component Value Date   TSH 1.780 09/13/2020   Lab Results  Component Value Date   WBC 5.9 09/13/2020   HGB 13.6 09/13/2020   HCT 40.3 09/13/2020   MCV 93 09/13/2020   PLT 191 09/13/2020   Lab Results  Component Value Date   NA 139 09/13/2020   K 4.1 09/13/2020   CO2 24 09/13/2020   GLUCOSE 98 09/13/2020   BUN 14 09/13/2020   CREATININE 0.80 09/13/2020   BILITOT 0.4 09/13/2020   ALKPHOS 113 09/13/2020   AST 18 09/13/2020   ALT 18 09/13/2020   PROT 7.4 09/13/2020   ALBUMIN 3.8 09/13/2020   CALCIUM 9.2 09/13/2020   EGFR 78 09/13/2020   GFR 81.03 02/22/2015   Lab Results  Component Value Date   CHOL 215 (H) 09/13/2020   Lab Results  Component Value Date   HDL 74 09/13/2020   Lab Results  Component Value Date   LDLCALC 129 (H) 09/13/2020   Lab Results  Component Value Date   TRIG 67 09/13/2020    Lab Results  Component Value Date   CHOLHDL 2.9 09/13/2020   Lab Results  Component Value Date   HGBA1C 5.6 09/13/2020  Assessment & Plan:   Problem List Items Addressed This Visit       Cardiovascular and Mediastinum   Essential hypertension - Primary (Chronic)    -BP repeated and improved. Patient has tried and not tolerated different antihypertensives, see allergy list. Discussed alternative treatment options and prefers to discontinue olmesartan and start hydralazine 10 mg TID, discussed potential side effects. Recommend to continue metoprolol succinate 100 mg and HCTZ 12.5 mg. Advised if unable to tolerate hydralazine then recommend changing to Losartan. Patient verbalized understanding. Advised to send me an updat in 4 week via [facility] or phone call. -Continue with ambulatory BP/pulse monitoring. -Continue weight loss efforts and low sodium diet. -Will continue to monitor.      Relevant Medications   metoprolol succinate (TOPROL-XL) 100 MG 24 hr tablet   hydrALAZINE (APRESOLINE) 10 MG tablet     Other   Class 2 severe obesity with serious comorbidity and body mass index (BMI) of 35.0 to 35.9 in adult [facility])    -Associated with hypertension and hyperlipidemia. -Followed by [facility]. -Encourage to continue weight loss efforts with dietary changes and exercise regimen.       Other hyperlipidemia    -Statin intolerance. -Last lipid panel: HDL 74, LDL 129 -Recommend to continue weight loss efforts and heart healthy diet. -Will continue to monitor and repeat lipid panel with [facility].       Relevant Medications   metoprolol succinate (TOPROL-XL) 100 MG 24 hr tablet   hydrALAZINE (APRESOLINE) 10 MG tablet    Meds ordered this encounter  Medications   DISCONTD: olmesartan (BENICAR) 40 MG tablet    Sig: Take 1 tablet (40 mg total) by mouth daily.    Dispense:  90 tablet    Refill:  1    Requesting 1 year supply   metoprolol succinate (TOPROL-XL) 100 MG 24 hr tablet     Sig: Take with or immediately following a meal.    Dispense:  90 tablet    Refill:  1    Requesting 1 year supply   hydrALAZINE (APRESOLINE) 10 MG tablet    Sig: Take 1 tablet (10 mg total) by mouth 3 (three) times daily.    Dispense:  270 tablet    Refill:  0    Order Specific Question:   Supervising Provider    Answer:   Beatrice Lecher D [[id number]]    Follow-up: Return in about 4 months (around 07/26/2021) for [place] and [place] .   Note:  This note was prepared with assistance of Dragon voice recognition software. Occasional wrong-word or sound-a-like substitutions may have occurred due to the inherent limitations of voice recognition software.  Lorrene Reid, PA-C

## 2021-03-27 NOTE — Assessment & Plan Note (Signed)
-  Statin intolerance. -Last lipid panel: HDL 74, LDL 129 -Recommend to continue weight loss efforts and heart healthy diet. -Will continue to monitor and repeat lipid panel with [facility].

## 2021-03-27 NOTE — Assessment & Plan Note (Addendum)
-  BP repeated and improved. Patient has tried and not tolerated different antihypertensives, see allergy list. Discussed alternative treatment options and prefers to discontinue olmesartan and start hydralazine 10 mg TID, discussed potential side effects. Recommend to continue metoprolol succinate 100 mg and HCTZ 12.5 mg. Advised if unable to tolerate hydralazine then recommend changing to Losartan. Patient verbalized understanding. Advised to send me an updat in 4 week via [facility] or phone call. -Continue with ambulatory BP/pulse monitoring. -Continue weight loss efforts and low sodium diet. -Will continue to monitor.

## 2021-03-27 NOTE — Patient Instructions (Signed)
Managing Your Hypertension Hypertension, also called high blood pressure, is when the force of the blood pressing against the walls of the arteries is too strong. Arteries are blood vessels that carry blood from your heart throughout your body. Hypertension forces the heart to work harder to pump blood and may cause the arteries to become narrow or stiff. Understanding blood pressure readings Your personal target blood pressure may vary depending on your medical conditions, your age, and other factors. A blood pressure reading includes a higher number over a lower number. Ideally, your blood pressure should be below 120/80. You should know that: The first, or top, number is called the systolic pressure. It is a measure of the pressure in your arteries as your heart beats. The second, or bottom number, is called the diastolic pressure. It is a measure of the pressure in your arteries as the heart relaxes. Blood pressure is classified into four stages. Based on your blood pressure reading, your health care provider may use the following stages to determine what type of treatment you need, if any. Systolic pressure and diastolic pressure are measured in a unit called mmHg. Normal Systolic pressure: below 120. Diastolic pressure: below 80. Elevated Systolic pressure: 120-129. Diastolic pressure: below 80. Hypertension stage 1 Systolic pressure: 130-139. Diastolic pressure: 80-89. Hypertension stage 2 Systolic pressure: 140 or above. Diastolic pressure: 90 or above. How can this condition affect me? Managing your hypertension is an important responsibility. Over time, hypertension can damage the arteries and decrease blood flow to important parts of the body, including the brain, heart, and kidneys. Having untreated or uncontrolled hypertension can lead to: A heart attack. A stroke. A weakened blood vessel (aneurysm). Heart failure. Kidney damage. Eye damage. Metabolic syndrome. Memory and  concentration problems. Vascular dementia. What actions can I take to manage this condition? Hypertension can be managed by making lifestyle changes and possibly by taking medicines. Your health care provider will help you make a plan to bring your blood pressure within a normal range. Nutrition  Eat a diet that is high in fiber and potassium, and low in salt (sodium), added sugar, and fat. An example eating plan is called the Dietary Approaches to Stop Hypertension (DASH) diet. To eat this way: Eat plenty of fresh fruits and vegetables. Try to fill one-half of your plate at each meal with fruits and vegetables. Eat whole grains, such as whole-wheat pasta, brown rice, or whole-grain bread. Fill about one-fourth of your plate with whole grains. Eat low-fat dairy products. Avoid fatty cuts of meat, processed or cured meats, and poultry with skin. Fill about one-fourth of your plate with lean proteins such as fish, chicken without skin, beans, eggs, and tofu. Avoid pre-made and processed foods. These tend to be higher in sodium, added sugar, and fat. Reduce your daily sodium intake. Most people with hypertension should eat less than 1,500 mg of sodium a day. Lifestyle  Work with your health care provider to maintain a healthy body weight or to lose weight. Ask what an ideal weight is for you. Get at least 30 minutes of exercise that causes your heart to beat faster (aerobic exercise) most days of the week. Activities may include walking, swimming, or biking. Include exercise to strengthen your muscles (resistance exercise), such as weight lifting, as part of your weekly exercise routine. Try to do these types of exercises for 30 minutes at least 3 days a week. Do not use any products that contain nicotine or tobacco, such as cigarettes, e-cigarettes,   and chewing tobacco. If you need help quitting, ask your health care provider. Control any long-term (chronic) conditions you have, such as high  cholesterol or diabetes. Identify your sources of stress and find ways to manage stress. This may include meditation, deep breathing, or making time for fun activities. Alcohol use Do not drink alcohol if: Your health care provider tells you not to drink. You are pregnant, may be pregnant, or are planning to become pregnant. If you drink alcohol: Limit how much you use to: 0-1 drink a day for women. 0-2 drinks a day for men. Be aware of how much alcohol is in your drink. In the [place] one drink equals one 12 oz bottle of beer (355 mL), one 5 oz glass of wine (148 mL), or one 1 oz glass of hard liquor (44 mL). Medicines Your health care provider may prescribe medicine if lifestyle changes are not enough to get your blood pressure under control and if: Your systolic blood pressure is 130 or higher. Your diastolic blood pressure is 80 or higher. Take medicines only as told by your health care provider. Follow the directions carefully. Blood pressure medicines must be taken as told by your health care provider. The medicine does not work as well when you skip doses. Skipping doses also puts you at risk for problems. Monitoring Before you monitor your blood pressure: Do not smoke, drink caffeinated beverages, or exercise within 30 minutes before taking a measurement. Use the bathroom and empty your bladder (urinate). Sit quietly for at least 5 minutes before taking measurements. Monitor your blood pressure at home as told by your health care provider. To do this: Sit with your back straight and supported. Place your feet flat on the floor. Do not cross your legs. Support your arm on a flat surface, such as a table. Make sure your upper arm is at heart level. Each time you measure, take two or three readings one minute apart and record the results. You may also need to have your blood pressure checked regularly by your health care provider. General information Talk with your health care  provider about your diet, exercise habits, and other lifestyle factors that may be contributing to hypertension. Review all the medicines you take with your health care provider because there may be side effects or interactions. Keep all visits as told by your health care provider. Your health care provider can help you create and adjust your plan for managing your high blood pressure. Where to find more information National Heart, Lung, and Blood Institute: www.nhlbi.nih.gov American Heart Association: www.heart.org Contact a health care provider if: You think you are having a reaction to medicines you have taken. You have repeated (recurrent) headaches. You feel dizzy. You have swelling in your ankles. You have trouble with your vision. Get help right away if: You develop a severe headache or confusion. You have unusual weakness or numbness, or you feel faint. You have severe pain in your chest or abdomen. You vomit repeatedly. You have trouble breathing. These symptoms may represent a serious problem that is an emergency. Do not wait to see if the symptoms will go away. Get medical help right away. Call your local emergency services (911 in the [place] Do not drive yourself to the hospital. Summary Hypertension is when the force of blood pumping through your arteries is too strong. If this condition is not controlled, it may put you at risk for serious complications. Your personal target blood pressure may vary depending on   your medical conditions, your age, and other factors. For most people, a normal blood pressure is less than 120/80. Hypertension is managed by lifestyle changes, medicines, or both. Lifestyle changes to help manage hypertension include losing weight, eating a healthy, low-sodium diet, exercising more, stopping smoking, and limiting alcohol. This information is not intended to replace advice given to you by your health care provider. Make sure you discuss any questions  you have with your health care provider. Document Revised: 04/13/2019 Document Reviewed: 02/24/2019 Elsevier Patient Education  2022 [employer] Inc.  

## 2021-03-27 NOTE — Assessment & Plan Note (Signed)
-  Associated with hypertension and hyperlipidemia. -Followed by [facility]. -Encourage to continue weight loss efforts with dietary changes and exercise regimen.

## 2021-04-18 ENCOUNTER — Other Ambulatory Visit: Payer: Self-pay

## 2021-04-18 ENCOUNTER — Encounter (INDEPENDENT_AMBULATORY_CARE_PROVIDER_SITE_OTHER): Payer: Self-pay | Admitting: Family Medicine

## 2021-04-18 ENCOUNTER — Ambulatory Visit (INDEPENDENT_AMBULATORY_CARE_PROVIDER_SITE_OTHER): Payer: Medicare Other | Admitting: Family Medicine

## 2021-04-18 VITALS — BP 170/80 | HR 67 | Temp 98.5°F | Ht 67.0 in | Wt 220.0 lb

## 2021-04-18 DIAGNOSIS — I10 Essential (primary) hypertension: Secondary | ICD-10-CM

## 2021-04-18 DIAGNOSIS — Z6834 Body mass index (BMI) 34.0-34.9, adult: Secondary | ICD-10-CM

## 2021-04-18 NOTE — Progress Notes (Signed)
Chief Complaint:   OBESITY Doris Harris is here to discuss her progress with her obesity treatment plan along with follow-up of her obesity related diagnoses. Corene is on the Category 1 Plan and keeping a food journal and adhering to recommended goals of 1000-1100 calories and 80 grams of protein and states she is following her eating plan approximately 80% of the time. Chrisha states she is using the elliptical for 60 minutes 4 times per week and treadmill for 90 minutes 4 times per week.  Today's visit was #: 31 Starting weight: 234 lbs Starting date: 06/01/2019 Today's weight: 220 lbs Today's date: 04/18/2021 Total lbs lost to date: 14 lbs Total lbs lost since last in-office visit: 0  Interim History: Kylen did well over Christmas and maintained her weight. She is journaling consistently. She is meeting protein and calorie goals. She is very consistent with her exercise regimen.  RMR is low at 1351 and her weight has been plateaued for a year and a half.  Subjective:   1. Essential hypertension Avalina's blood pressure is elevated today. Hydrazine added to regimen recently by primary care physician. She is also on Metoprolol and HCTZ. She reports compliance with all meds.  She notes some chest pains since hydralazine.    BP Readings from Last 3 Encounters:  04/18/21 (!) 170/80  03/27/21 130/84  03/14/21 (!) 153/84    Assessment/Plan:   1. Essential hypertension Suri will call primary care physician about elevated blood pressure today.   2. Obesity: Current BMI 34.45 Zania is currently in the action stage of change. As such, her goal is to continue with weight loss efforts. She has agreed to the Category 1 Plan.   Exercise goals:  As is.  Behavioral modification strategies: increasing lean protein intake.  Treva has agreed to follow-up with our clinic in 4-5 weeks.  Objective:   Blood pressure (!) 170/80, pulse 67, temperature 98.5 F (36.9 C), height 5\' 7"  (1.702 m), weight  220 lb (99.8 kg), SpO2 97 %. Body mass index is 34.46 kg/m.  General: Cooperative, alert, well developed, in no acute distress. HEENT: Conjunctivae and lids unremarkable. Cardiovascular: Regular rhythm.  Lungs: Normal work of breathing. Neurologic: No focal deficits.   Lab Results  Component Value Date   CREATININE 0.80 09/13/2020   BUN 14 09/13/2020   NA 139 09/13/2020   K 4.1 09/13/2020   CL 100 09/13/2020   CO2 24 09/13/2020   Lab Results  Component Value Date   ALT 18 09/13/2020   AST 18 09/13/2020   ALKPHOS 113 09/13/2020   BILITOT 0.4 09/13/2020   Lab Results  Component Value Date   HGBA1C 5.6 09/13/2020   HGBA1C 5.5 03/22/2020   HGBA1C 5.5 11/17/2019   HGBA1C 5.6 06/01/2019   HGBA1C 5.7 (H) 01/12/2019   Lab Results  Component Value Date   INSULIN 5.3 03/22/2020   INSULIN 6.6 11/17/2019   INSULIN 8.7 06/01/2019   Lab Results  Component Value Date   TSH 1.780 09/13/2020   Lab Results  Component Value Date   CHOL 215 (H) 09/13/2020   HDL 74 09/13/2020   LDLCALC 129 (H) 09/13/2020   LDLDIRECT 120.2 09/05/2012   TRIG 67 09/13/2020   CHOLHDL 2.9 09/13/2020   Lab Results  Component Value Date   VD25OH 62.7 03/22/2020   VD25OH 39.5 11/17/2019   VD25OH 36.7 06/01/2019   Lab Results  Component Value Date   WBC 5.9 09/13/2020   HGB 13.6 09/13/2020  HCT 40.3 09/13/2020   MCV 93 09/13/2020   PLT 191 09/13/2020   No results found for: IRON, TIBC, FERRITIN  Attestation Statements:   Reviewed by clinician on day of visit: allergies, medications, problem list, medical history, surgical history, family history, social history, and previous encounter notes.  I, Jackson Latino, RMA, am acting as Energy manager for [place], FNP.  I have reviewed the above documentation for accuracy and completeness, and I agree with the above. -  Jesse Sans, FNP

## 2021-04-25 ENCOUNTER — Telehealth: Payer: Self-pay | Admitting: Physician Assistant

## 2021-04-25 ENCOUNTER — Other Ambulatory Visit: Payer: Self-pay

## 2021-04-25 NOTE — Telephone Encounter (Signed)
Patient called and stated the new medication (hydralazine) she was put on for her bp is making her chest hurt. Please advise. [phone]

## 2021-04-25 NOTE — Telephone Encounter (Signed)
Spoke with patient to share Doris Harris recommendations to stop taking the new BP medication and consider a referral to cardiology to see the hypertension clinic. The patient gave verbal understanding and will call us back if she agreeable with the referral.

## 2021-05-16 ENCOUNTER — Ambulatory Visit (INDEPENDENT_AMBULATORY_CARE_PROVIDER_SITE_OTHER): Payer: Medicare Other | Admitting: Family Medicine

## 2021-05-16 ENCOUNTER — Other Ambulatory Visit: Payer: Self-pay

## 2021-05-16 ENCOUNTER — Encounter (INDEPENDENT_AMBULATORY_CARE_PROVIDER_SITE_OTHER): Payer: Self-pay | Admitting: Family Medicine

## 2021-05-16 VITALS — BP 166/84 | HR 63 | Temp 98.5°F | Ht 67.0 in | Wt 220.0 lb

## 2021-05-16 DIAGNOSIS — R7303 Prediabetes: Secondary | ICD-10-CM | POA: Diagnosis not present

## 2021-05-16 DIAGNOSIS — Z6835 Body mass index (BMI) 35.0-35.9, adult: Secondary | ICD-10-CM

## 2021-05-16 DIAGNOSIS — I10 Essential (primary) hypertension: Secondary | ICD-10-CM | POA: Diagnosis not present

## 2021-05-16 DIAGNOSIS — Z6834 Body mass index (BMI) 34.0-34.9, adult: Secondary | ICD-10-CM

## 2021-05-16 DIAGNOSIS — E669 Obesity, unspecified: Secondary | ICD-10-CM | POA: Diagnosis not present

## 2021-05-16 NOTE — Progress Notes (Signed)
Chief Complaint:   OBESITY Doris Harris is here to discuss her progress with her obesity treatment plan along with follow-up of her obesity related diagnoses. Doris Harris is on the Category 1 Plan and states she is following her eating plan approximately 90% of the time. Doris Harris states she is using the elliptical, weights and treadmill for 60-90 minutes 3 times per week.  Today's visit was #: 32 Starting weight: 234 lbs Starting date: 06/01/2019 Today's weight: 220 lbs Today's date: 05/16/2021 Total lbs lost to date: 14 lbs Total lbs lost since last in-office visit: 0  Interim History: Doris Harris's weight has been plateaued for a year and a half.  She always reports good plan adherence.  She does eat all of the food on the plan and keeps her snack calories less than 100/day.  She does not weigh her meat portions. She is very consistent with her exercise. She does not eat out much. She drinks 64 plus ounces of water per day.  She is always very consistent with her exercise.  Subjective:   1. Essential hypertension [name]'s blood pressure was elevated today 166/84. She is on olmesartan, HCTZ, and Metoprolol. Her home blood pressures range 140-160/70-80's.  She has been working with her PCP to get better blood pressure control.  She has an intolerance to several antihypertensives.  She was most recently on hydralazine which she did not tolerate well.  BP Readings from Last 3 Encounters:  05/16/21 (!) 166/84  04/18/21 (!) 170/80  03/27/21 130/84    2. Pre-diabetes Doris Harris 's last A1C was 5.6. She is not on medication. She denies polyphagia.   Lab Results  Component Value Date   HGBA1C 5.6 09/13/2020   Lab Results  Component Value Date   INSULIN 5.3 03/22/2020   INSULIN 6.6 11/17/2019   INSULIN 8.7 06/01/2019    Assessment/Plan:   1. Essential hypertension Doris Harris is in contact with primary care provider about this issue.  PCP is aware that hypertension is not well controlled.  2.  Pre-diabetes Doris Harris will continue meal plan.  She declines medication such as metformin or GLP-1.  3. Obesity: Current BMI 34.45 Doris Harris is currently in the action stage of change. As such, her goal is to continue with weight loss efforts. She has agreed to the Category 1 Plan.   Doris Harris will measure all food. We will check IC next office visit.   Exercise goals:  As is.  Behavioral modification strategies: planning for success.  Doris Harris has agreed to follow-up with our clinic in 4-5 weeks with Alois Cliche PA-C.  Objective:   Blood pressure (!) 166/84, pulse 63, temperature 98.5 F (36.9 C), height 5\' 7"  (1.702 m), weight 220 lb (99.8 kg), SpO2 99 %. Body mass index is 34.46 kg/m.  General: Cooperative, alert, well developed, in no acute distress. HEENT: Conjunctivae and lids unremarkable. Cardiovascular: Regular rhythm.  Lungs: Normal work of breathing. Neurologic: No focal deficits.   Lab Results  Component Value Date   CREATININE 0.80 09/13/2020   BUN 14 09/13/2020   NA 139 09/13/2020   K 4.1 09/13/2020   CL 100 09/13/2020   CO2 24 09/13/2020   Lab Results  Component Value Date   ALT 18 09/13/2020   AST 18 09/13/2020   ALKPHOS 113 09/13/2020   BILITOT 0.4 09/13/2020   Lab Results  Component Value Date   HGBA1C 5.6 09/13/2020   HGBA1C 5.5 03/22/2020   HGBA1C 5.5 11/17/2019   HGBA1C 5.6 06/01/2019   HGBA1C 5.7 (  H) 01/12/2019   Lab Results  Component Value Date   INSULIN 5.3 03/22/2020   INSULIN 6.6 11/17/2019   INSULIN 8.7 06/01/2019   Lab Results  Component Value Date   TSH 1.780 09/13/2020   Lab Results  Component Value Date   CHOL 215 (H) 09/13/2020   HDL 74 09/13/2020   LDLCALC 129 (H) 09/13/2020   LDLDIRECT 120.2 09/05/2012   TRIG 67 09/13/2020   CHOLHDL 2.9 09/13/2020   Lab Results  Component Value Date   VD25OH 62.7 03/22/2020   VD25OH 39.5 11/17/2019   VD25OH 36.7 06/01/2019   Lab Results  Component Value Date   WBC 5.9 09/13/2020    HGB 13.6 09/13/2020   HCT 40.3 09/13/2020   MCV 93 09/13/2020   PLT 191 09/13/2020   No results found for: IRON, TIBC, FERRITIN  Attestation Statements:   Reviewed by clinician on day of visit: allergies, medications, problem list, medical history, surgical history, family history, social history, and previous encounter notes.  I, Jackson Latino, RMA, am acting as Energy manager for [place], FNP.  I have reviewed the above documentation for accuracy and completeness, and I agree with the above. -  Jesse Sans, FNP

## 2021-05-28 ENCOUNTER — Other Ambulatory Visit: Payer: Self-pay | Admitting: Physician Assistant

## 2021-05-28 DIAGNOSIS — I10 Essential (primary) hypertension: Secondary | ICD-10-CM

## 2021-06-19 ENCOUNTER — Ambulatory Visit (INDEPENDENT_AMBULATORY_CARE_PROVIDER_SITE_OTHER): Payer: Medicare Other | Admitting: Physician Assistant

## 2021-06-27 ENCOUNTER — Ambulatory Visit (INDEPENDENT_AMBULATORY_CARE_PROVIDER_SITE_OTHER): Payer: Medicare Other | Admitting: Nurse Practitioner

## 2021-06-27 ENCOUNTER — Encounter (INDEPENDENT_AMBULATORY_CARE_PROVIDER_SITE_OTHER): Payer: Self-pay | Admitting: Nurse Practitioner

## 2021-06-27 ENCOUNTER — Other Ambulatory Visit: Payer: Self-pay

## 2021-06-27 VITALS — BP 159/87 | HR 71 | Temp 98.0°F | Ht 67.0 in | Wt 222.0 lb

## 2021-06-27 DIAGNOSIS — R0602 Shortness of breath: Secondary | ICD-10-CM

## 2021-06-27 DIAGNOSIS — I10 Essential (primary) hypertension: Secondary | ICD-10-CM

## 2021-06-27 DIAGNOSIS — E559 Vitamin D deficiency, unspecified: Secondary | ICD-10-CM

## 2021-06-27 DIAGNOSIS — Z6834 Body mass index (BMI) 34.0-34.9, adult: Secondary | ICD-10-CM

## 2021-06-27 DIAGNOSIS — R7303 Prediabetes: Secondary | ICD-10-CM

## 2021-06-27 DIAGNOSIS — E7849 Other hyperlipidemia: Secondary | ICD-10-CM | POA: Diagnosis not present

## 2021-06-27 DIAGNOSIS — E66812 Obesity, class 2: Secondary | ICD-10-CM

## 2021-06-27 DIAGNOSIS — E669 Obesity, unspecified: Secondary | ICD-10-CM

## 2021-06-27 NOTE — Progress Notes (Signed)
? ? ? ?Chief Complaint:  ? ?OBESITY ?Doris Harris is here to discuss her progress with her obesity treatment plan along with follow-up of her obesity related diagnoses. Doris Harris is on the Category 1 Plan and states she is following her eating plan approximately 90% of the time. Doris Harris states she is using the elliptical, bike, treadmill, and weights for 60 minutes 4 times per week. ? ?Today's visit was #: 33 ?Starting weight: 234 lbs ?Starting date: 06/01/2019 ?Today's weight: 222 lbs ?Today's date: 06/27/2021 ?Total lbs lost to date: 12 lbs ?Total lbs lost since last in-office visit: 0 ? ?Interim History: Doris Harris reports following her meal plan 90 % of the time.  She denies skipping meals.  She does not weigh her meat portions.  She is drinking 64 ounces of water and herbal tea.  Reports some hunger at night.  Denies cravings. She reports that she sometimes does not eat her protein because she does not want to eat meat.  Does not eat meat everyday or with every meal.  Will tend to eat more vegetables in place of meat.   ? ?Subjective:  ? ?1. Essential hypertension ?BP is elevated today.  She is taking HCTZ, olmesartan, and Toprol XL.  She has an intolerance to multiple antihypertensives.  Denies CP/palpitations.  Has seen cardiology in the past.   ? ?2. Pre-diabetes ?Last A1c was 5.6.  She has never been on medication.  ? ?3. Other hyperlipidemia ?She was on Lipitor in the past and stopped due to side effects.  She is not currently on a statin. ? ?4. Vitamin D deficiency ?She is currently taking OTC vitamin D 5000 each day. She denies nausea, vomiting or muscle weakness. ? ?5. SOBOE (shortness of breath on exertion) ?She denies dizziness or lightheadedness. ? ?Assessment/Plan:  ? ?1. Essential hypertension ?Doris Harris is working on healthy weight loss and exercise to improve blood pressure control. We will watch for signs of hypotension as she continues her lifestyle modifications.  Continue to follow-up with PCP.  Continue  medications as directed.  Discussed seeing Cardiology back for follow-up.  She will call and schedule an appointment.  Labs obtained today.   ? ?- Comprehensive metabolic panel ? ?2. Pre-diabetes ?Doris Harris will continue to work on weight loss, exercise, and decreasing simple carbohydrates to help decrease the risk of diabetes.  Labs obtained today.  Discussed metformin.  Doris Harris is not interested in starting a medication at this time.  Handouts given on insulin resistance, prediabetes, and metformin.  Labs obtained today. ?   ?- Comprehensive metabolic panel ?- Hemoglobin A1c ?- Insulin, random ? ?3. Other hyperlipidemia ?Cardiovascular risk and specific lipid/LDL goals reviewed.  We discussed several lifestyle modifications today and Doris Harris will continue to work on diet, exercise and weight loss efforts. Orders and follow up as documented in patient record.  ? ?Counseling ?Intensive lifestyle modifications are the first line treatment for this issue. ?Dietary changes: Increase soluble fiber. Decrease simple carbohydrates. ?Exercise changes: Moderate to vigorous-intensity aerobic activity 150 minutes per week if tolerated. ?Lipid-lowering medications: see documented in medical record. ? ?- Comprehensive metabolic panel ?- Lipid Panel With LDL/HDL Ratio ? ?4. Vitamin D deficiency ?Low Vitamin D level contributes to fatigue and are associated with obesity, breast, and colon cancer. Labs obtained today. ? ?- Comprehensive metabolic panel ?- VITAMIN D 25 Hydroxy (Vit-D Deficiency, Fractures) ? ?5. SOBOE (shortness of breath on exertion) ?IC obtained today and reviewed with patient. ? ?6. Obesity: Current BMI 34.8 ? ?Doris Harris is currently in  the action stage of change. As such, her goal is to continue with weight loss efforts. She has agreed to the Category 2 Plan.  ? ?Will change to Category 2 Meal Plan to help increase protein intake. ? ?Handouts Given:  Category 2 Meal Plan and Additional Lunch Options. ? ?Exercise goals:  As  is. ? ?Behavioral modification strategies: increasing lean protein intake, increasing water intake, no skipping meals, and meal planning and cooking strategies. ? ?Doris Harris has agreed to follow-up with our clinic in 4 weeks. She was informed of the importance of frequent follow-up visits to maximize her success with intensive lifestyle modifications for her multiple health conditions.  ? ?Doris Harris was informed we would discuss her lab results at her next visit unless there is a critical issue that needs to be addressed sooner. Doris Harris agreed to keep her next visit at the agreed upon time to discuss these results. ? ?Objective:  ? ?Blood pressure (!) 159/87, pulse 71, temperature 98 ?F (36.7 ?C), height 5\' 7"  (1.702 m), weight 222 lb (100.7 kg), SpO2 99 %. ?Body mass index is 34.77 kg/m?. ? ?General: Cooperative, alert, well developed, in no acute distress. ?HEENT: Conjunctivae and lids unremarkable. ?Cardiovascular: Regular rhythm.  ?Lungs: Normal work of breathing. ?Neurologic: No focal deficits.  ? ?Lab Results  ?Component Value Date  ? CREATININE 0.80 09/13/2020  ? BUN 14 09/13/2020  ? NA 139 09/13/2020  ? K 4.1 09/13/2020  ? CL 100 09/13/2020  ? CO2 24 09/13/2020  ? ?Lab Results  ?Component Value Date  ? ALT 18 09/13/2020  ? AST 18 09/13/2020  ? ALKPHOS 113 09/13/2020  ? BILITOT 0.4 09/13/2020  ? ?Lab Results  ?Component Value Date  ? HGBA1C 5.6 09/13/2020  ? HGBA1C 5.5 03/22/2020  ? HGBA1C 5.5 11/17/2019  ? HGBA1C 5.6 06/01/2019  ? HGBA1C 5.7 (H) 01/12/2019  ? ?Lab Results  ?Component Value Date  ? INSULIN 5.3 03/22/2020  ? INSULIN 6.6 11/17/2019  ? INSULIN 8.7 06/01/2019  ? ?Lab Results  ?Component Value Date  ? TSH 1.780 09/13/2020  ? ?Lab Results  ?Component Value Date  ? CHOL 215 (H) 09/13/2020  ? HDL 74 09/13/2020  ? LDLCALC 129 (H) 09/13/2020  ? LDLDIRECT 120.2 09/05/2012  ? TRIG 67 09/13/2020  ? CHOLHDL 2.9 09/13/2020  ? ?Lab Results  ?Component Value Date  ? VD25OH 62.7 03/22/2020  ? VD25OH 39.5 11/17/2019  ?  VD25OH 36.7 06/01/2019  ? ?Lab Results  ?Component Value Date  ? WBC 5.9 09/13/2020  ? HGB 13.6 09/13/2020  ? HCT 40.3 09/13/2020  ? MCV 93 09/13/2020  ? PLT 191 09/13/2020  ? ?Obesity Behavioral Intervention:  ? ?Approximately 15 minutes were spent on the discussion below. ? ?ASK: ?We discussed the diagnosis of obesity with Doris Harris today and Doris Harris agreed to give 11/13/2020 permission to discuss obesity behavioral modification therapy today. ? ?ASSESS: ?Doris Harris has the diagnosis of obesity and her BMI today is 34.8. Doris Harris is in the action stage of change.  ? ?ADVISE: ?Doris Harris was educated on the multiple health risks of obesity as well as the benefit of weight loss to improve her health. She was advised of the need for long term treatment and the importance of lifestyle modifications to improve her current health and to decrease her risk of future health problems. ? ?AGREE: ?Multiple dietary modification options and treatment options were discussed and Doris Harris agreed to follow the recommendations documented in the above note. ? ?ARRANGE: ?Doris Harris was educated on the importance of  frequent visits to treat obesity as outlined per CMS and USPSTF guidelines and agreed to schedule her next follow up appointment today. ? ?Attestation Statements:  ? ?Reviewed by clinician on day of visit: allergies, medications, problem list, medical history, surgical history, family history, social history, and previous encounter notes. ? ?I, Insurance claims handlerAmber Harris, CMA, am acting as transcriptionist for Irene LimboStephanie Abiola Behring, FNP. ? ?I have reviewed the above documentation for accuracy and completeness, and I agree with the above. Irene Limbo-  Temia Debroux, FNP  ?

## 2021-06-28 LAB — LIPID PANEL WITH LDL/HDL RATIO
Cholesterol, Total: 242 mg/dL — ABNORMAL HIGH (ref 100–199)
HDL: 88 mg/dL (ref >39)
LDL Chol Calc (NIH): 141 mg/dL — ABNORMAL HIGH (ref 0–99)
LDL/HDL Ratio: 1.6 ratio (ref 0.0–3.2)
Triglycerides: 79 mg/dL (ref 0–149)
VLDL Cholesterol Cal: 13 mg/dL (ref 5–40)

## 2021-06-28 LAB — COMPREHENSIVE METABOLIC PANEL
ALT: 26 IU/L (ref 0–32)
AST: 25 IU/L (ref 0–40)
Albumin/Globulin Ratio: 1.4 (ref 1.2–2.2)
Albumin: 4.4 g/dL (ref 3.7–4.7)
Alkaline Phosphatase: 109 IU/L (ref 44–121)
BUN/Creatinine Ratio: 18 (ref 12–28)
BUN: 16 mg/dL (ref 8–27)
Bilirubin Total: 0.4 mg/dL (ref 0.0–1.2)
CO2: 26 mmol/L (ref 20–29)
Calcium: 9.4 mg/dL (ref 8.7–10.3)
Chloride: 102 mmol/L (ref 96–106)
Creatinine, Ser: 0.9 mg/dL (ref 0.57–1.00)
Globulin, Total: 3.1 g/dL (ref 1.5–4.5)
Glucose: 101 mg/dL — ABNORMAL HIGH (ref 70–99)
Potassium: 4.5 mmol/L (ref 3.5–5.2)
Sodium: 140 mmol/L (ref 134–144)
Total Protein: 7.5 g/dL (ref 6.0–8.5)
eGFR: 67 mL/min/{1.73_m2} (ref >59)

## 2021-06-28 LAB — HEMOGLOBIN A1C
Est. average glucose Bld gHb Est-mCnc: 117 mg/dL
Hgb A1c MFr Bld: 5.7 % — ABNORMAL HIGH (ref 4.8–5.6)

## 2021-06-28 LAB — VITAMIN D 25 HYDROXY (VIT D DEFICIENCY, FRACTURES): Vit D, 25-Hydroxy: 55.4 ng/mL (ref 30.0–100.0)

## 2021-06-28 LAB — INSULIN, RANDOM: INSULIN: 6.2 u[IU]/mL (ref 2.6–24.9)

## 2021-07-26 ENCOUNTER — Encounter: Payer: Medicare Other | Admitting: Physician Assistant

## 2021-07-29 ENCOUNTER — Encounter (INDEPENDENT_AMBULATORY_CARE_PROVIDER_SITE_OTHER): Payer: Self-pay | Admitting: Family Medicine

## 2021-07-31 ENCOUNTER — Encounter (INDEPENDENT_AMBULATORY_CARE_PROVIDER_SITE_OTHER): Payer: Self-pay | Admitting: Family Medicine

## 2021-07-31 ENCOUNTER — Telehealth (INDEPENDENT_AMBULATORY_CARE_PROVIDER_SITE_OTHER): Payer: Medicare Other | Admitting: Family Medicine

## 2021-07-31 VITALS — BP 178/83 | HR 66 | Temp 98.4°F | Ht 67.0 in | Wt 221.0 lb

## 2021-07-31 DIAGNOSIS — Z6834 Body mass index (BMI) 34.0-34.9, adult: Secondary | ICD-10-CM | POA: Diagnosis not present

## 2021-07-31 DIAGNOSIS — E669 Obesity, unspecified: Secondary | ICD-10-CM | POA: Diagnosis not present

## 2021-07-31 DIAGNOSIS — E66812 Obesity, class 2: Secondary | ICD-10-CM

## 2021-07-31 DIAGNOSIS — I10 Essential (primary) hypertension: Secondary | ICD-10-CM

## 2021-07-31 NOTE — Progress Notes (Signed)
?TeleHealth Visit:  ?This visit was completed with telemedicine (audio/video) technology. ?[name] has verbally consented to this TeleHealth visit. The patient is located at home, the provider is located at home. The participants in this visit include the listed provider and patient. The visit was conducted today via phone call. Patient was unable to connect to video. Length of call was 18 minutes. ? ? ?OBESITY ?Doris Harris is here to discuss her progress with her obesity treatment plan along with follow-up of her obesity related diagnoses.  ? ?Today's visit was # 34 ?Starting weight: 234 lbs ?Starting date: 06/01/19 ?Weight at last in office visit: 222 lbs on 06/27/21  ?Total weight loss: 12 lbs at last in office visit on 06/27/21 . ?Today's reported weight: 221 lbs  ? ?Nutrition Plan: the Category 2 Plan.  ?Hunger is well controlled. Cravings are moderately controlled.  ?Current exercise:  treadmill or elliptical, weights  for 1.5 hours 3 days/week. Her sister motivates her to exercise and sometimes they go to the gym together..  ? ?Interim History: She is doing better with protein intake. In the past she has eaten more vegetables and less meat. Gets 64 oz of water daily.  ?Her weight has been plateaued for almost 2 years.  I am unsure why she has been unable to lose more weight.  She reports fairly good compliance with the meal plan. ? ?Assessment/Plan:  ?1. Hypertension ?Hypertension poorly controlled.  ?Medication(s): HCTZ 12.5 mg every other day, metoprolol XL 100 mg daily, olmesartan 40 mg daily.  She has intolerances to several blood pressure medications.  She reports she has seen Dr. Rennis Golden 1 time concerning her blood pressure. ?Denies chest pain and SOB. ? ?BP Readings from Last 3 Encounters:  ?07/31/21 (!) 178/83  ?06/27/21 (!) 159/87  ?05/16/21 (!) 166/84  ? ?Lab Results  ?Component Value Date  ? CREATININE 0.90 06/27/2021  ? CREATININE 0.80 09/13/2020  ? CREATININE 0.84 03/22/2020  ? ? ?Plan: ?Encouraged  her to call Dr. Blanchie Dessert office for an appointment. ?Advised her to let me know if she needs a referral. ? ? ?Obesity: Current BMI 34.61 ?Doris Harris is currently in the action stage of change. As such, her goal is to continue with weight loss efforts.  ?She has agreed to the Category 2 Plan.  ? ?Exercise goals: Continue current regimen. ? ?Behavioral modification strategies: increasing lean protein intake. ? ?[name] has agreed to follow-up with our clinic in 4 weeks.  ? ?No orders of the defined types were placed in this encounter. ? ? ?Medications Discontinued During This Encounter  ?Medication Reason  ? hydrALAZINE (APRESOLINE) 10 MG tablet   ?  ? ?No orders of the defined types were placed in this encounter. ?   ? ?Objective:  ? ?VITALS: Per patient if applicable, see vitals. ?GENERAL: Alert and in no acute distress. ?CARDIOPULMONARY: No increased WOB. Speaking in clear sentences.  ?PSYCH: Pleasant and cooperative. Speech normal rate and rhythm. Affect is appropriate. Insight and judgement are appropriate. Attention is focused, linear, and appropriate.  ?NEURO: Oriented as arrived to appointment on time with no prompting.  ? ?Lab Results  ?Component Value Date  ? CREATININE 0.90 06/27/2021  ? BUN 16 06/27/2021  ? NA 140 06/27/2021  ? K 4.5 06/27/2021  ? CL 102 06/27/2021  ? CO2 26 06/27/2021  ? ?Lab Results  ?Component Value Date  ? ALT 26 06/27/2021  ? AST 25 06/27/2021  ? ALKPHOS 109 06/27/2021  ? BILITOT 0.4 06/27/2021  ? ?Lab Results  ?  Component Value Date  ? HGBA1C 5.7 (H) 06/27/2021  ? HGBA1C 5.6 09/13/2020  ? HGBA1C 5.5 03/22/2020  ? HGBA1C 5.5 11/17/2019  ? HGBA1C 5.6 06/01/2019  ? ?Lab Results  ?Component Value Date  ? INSULIN 6.2 06/27/2021  ? INSULIN 5.3 03/22/2020  ? INSULIN 6.6 11/17/2019  ? INSULIN 8.7 06/01/2019  ? ?Lab Results  ?Component Value Date  ? TSH 1.780 09/13/2020  ? ?Lab Results  ?Component Value Date  ? CHOL 242 (H) 06/27/2021  ? HDL 88 06/27/2021  ? LDLCALC 141 (H) 06/27/2021  ? LDLDIRECT  120.2 09/05/2012  ? TRIG 79 06/27/2021  ? CHOLHDL 2.9 09/13/2020  ? ?Lab Results  ?Component Value Date  ? WBC 5.9 09/13/2020  ? HGB 13.6 09/13/2020  ? HCT 40.3 09/13/2020  ? MCV 93 09/13/2020  ? PLT 191 09/13/2020  ? ?No results found for: IRON, TIBC, FERRITIN ?Lab Results  ?Component Value Date  ? VD25OH 55.4 06/27/2021  ? VD25OH 62.7 03/22/2020  ? VD25OH 39.5 11/17/2019  ? ? ?Attestation Statements:  ? ?Reviewed by clinician on day of visit: allergies, medications, problem list, medical history, surgical history, family history, social history, and previous encounter notes. ? ? ? ?

## 2021-08-14 ENCOUNTER — Other Ambulatory Visit: Payer: Self-pay | Admitting: Physician Assistant

## 2021-08-24 NOTE — Progress Notes (Signed)
TeleHealth Visit:  This visit was completed with telemedicine (audio/video) technology. Doris Harris has verbally consented to this TeleHealth visit. The patient is located at home, the provider is located at home. The participants in this visit include the listed provider and patient. The visit was conducted today via MyChart video.  OBESITY Doris Harris is here to discuss her progress with her obesity treatment plan along with follow-up of her obesity related diagnoses.   Today's visit was # 35 Starting weight: 234 lbs Starting date: 06/01/19 Weight at last in office visit: 222 lbs on 06/27/21  Total weight loss: 12 lbs at last in office visit on 06/27/21 . Reported weight last virtual visit: 221 lbs  Today's reported weight:  No weight reported.   Nutrition Plan: the Category 2 Plan 85% of time.  Hunger is well controlled. No hunger at night as in past. Cravings are well controlled.   Current exercise:Gym 3 days per week- treadmill or elliptical, weights  for 1.5 hours   Interim History: Doris Harris reports good compliance with the meal plan.  She is getting in the prescribed protein.  This has always been a challenge for her.  She is exercising regularly which has always been a consistent habit for her.  Assessment/Plan:  1. Hypertension Hypertension needs improvement.  Home BPs running 136/88, 137/85, 140/78, 144/88.  Blood pressures in the office generally elevated. Medication(s): HCTZ 12.5 mg daily, metoprolol XL 100 mg daily, olmesartan 40 mg daily.  She has intolerance to several blood pressure medications Denies chest pain and SOB.  BP Readings from Last 3 Encounters:  07/31/21 (!) 178/83  06/27/21 (!) 159/87  05/16/21 (!) 166/84   Lab Results  Component Value Date   CREATININE 0.90 06/27/2021   CREATININE 0.80 09/13/2020   CREATININE 0.84 03/22/2020    Plan: Continue all medications at current doses. I encouraged her to schedule an appointment with cardiology for  hypertension management.  She has seen Dr. Rennis Golden in the past for this.  Obesity: Current BMI 34.61 Doris Harris is currently in the action stage of change. As such, her goal is to continue with weight loss efforts.  She has agreed to the Category 2 Plan.   Exercise goals: Continue current regimen.  Behavioral modification strategies: increasing lean protein intake.  Doris Harris has agreed to follow-up with our clinic in 4 weeks.   No orders of the defined types were placed in this encounter.   There are no discontinued medications.   No orders of the defined types were placed in this encounter.     Objective:   VITALS: Per patient if applicable, see vitals. GENERAL: Alert and in no acute distress. CARDIOPULMONARY: No increased WOB. Speaking in clear sentences.  PSYCH: Pleasant and cooperative. Speech normal rate and rhythm. Affect is appropriate. Insight and judgement are appropriate. Attention is focused, linear, and appropriate.  NEURO: Oriented as arrived to appointment on time with no prompting.   Lab Results  Component Value Date   CREATININE 0.90 06/27/2021   BUN 16 06/27/2021   NA 140 06/27/2021   K 4.5 06/27/2021   CL 102 06/27/2021   CO2 26 06/27/2021   Lab Results  Component Value Date   ALT 26 06/27/2021   AST 25 06/27/2021   ALKPHOS 109 06/27/2021   BILITOT 0.4 06/27/2021   Lab Results  Component Value Date   HGBA1C 5.7 (H) 06/27/2021   HGBA1C 5.6 09/13/2020   HGBA1C 5.5 03/22/2020   HGBA1C 5.5 11/17/2019   HGBA1C 5.6 06/01/2019  Lab Results  Component Value Date   INSULIN 6.2 06/27/2021   INSULIN 5.3 03/22/2020   INSULIN 6.6 11/17/2019   INSULIN 8.7 06/01/2019   Lab Results  Component Value Date   TSH 1.780 09/13/2020   Lab Results  Component Value Date   CHOL 242 (H) 06/27/2021   HDL 88 06/27/2021   LDLCALC 141 (H) 06/27/2021   LDLDIRECT 120.2 09/05/2012   TRIG 79 06/27/2021   CHOLHDL 2.9 09/13/2020   Lab Results  Component Value Date   WBC  5.9 09/13/2020   HGB 13.6 09/13/2020   HCT 40.3 09/13/2020   MCV 93 09/13/2020   PLT 191 09/13/2020   No results found for: IRON, TIBC, FERRITIN Lab Results  Component Value Date   VD25OH 55.4 06/27/2021   VD25OH 62.7 03/22/2020   VD25OH 39.5 11/17/2019    Attestation Statements:   Reviewed by clinician on day of visit: allergies, medications, problem list, medical history, surgical history, family history, social history, and previous encounter notes.

## 2021-08-26 ENCOUNTER — Other Ambulatory Visit: Payer: Self-pay | Admitting: Physician Assistant

## 2021-08-28 ENCOUNTER — Telehealth (INDEPENDENT_AMBULATORY_CARE_PROVIDER_SITE_OTHER): Payer: Medicare Other | Admitting: Family Medicine

## 2021-08-28 ENCOUNTER — Encounter (INDEPENDENT_AMBULATORY_CARE_PROVIDER_SITE_OTHER): Payer: Self-pay | Admitting: Family Medicine

## 2021-08-28 DIAGNOSIS — E669 Obesity, unspecified: Secondary | ICD-10-CM | POA: Diagnosis not present

## 2021-08-28 DIAGNOSIS — Z6834 Body mass index (BMI) 34.0-34.9, adult: Secondary | ICD-10-CM

## 2021-08-28 DIAGNOSIS — I10 Essential (primary) hypertension: Secondary | ICD-10-CM

## 2021-08-28 DIAGNOSIS — E66812 Obesity, class 2: Secondary | ICD-10-CM

## 2021-09-25 ENCOUNTER — Encounter (INDEPENDENT_AMBULATORY_CARE_PROVIDER_SITE_OTHER): Payer: Self-pay | Admitting: Nurse Practitioner

## 2021-09-25 ENCOUNTER — Ambulatory Visit (INDEPENDENT_AMBULATORY_CARE_PROVIDER_SITE_OTHER): Payer: Medicare Other | Admitting: Nurse Practitioner

## 2021-09-25 VITALS — BP 150/84 | HR 67 | Temp 98.4°F | Ht 67.0 in | Wt 222.0 lb

## 2021-09-25 DIAGNOSIS — Z6834 Body mass index (BMI) 34.0-34.9, adult: Secondary | ICD-10-CM | POA: Diagnosis not present

## 2021-09-25 DIAGNOSIS — E669 Obesity, unspecified: Secondary | ICD-10-CM | POA: Diagnosis not present

## 2021-09-25 DIAGNOSIS — I10 Essential (primary) hypertension: Secondary | ICD-10-CM

## 2021-09-26 NOTE — Progress Notes (Signed)
Chief Complaint:   OBESITY Doris Harris is here to discuss her progress with her obesity treatment plan along with follow-up of her obesity related diagnoses. Doris Harris is on the Category 1 Plan and states she is following her eating plan approximately 85% of the time. Doris Harris states she is attending [employer]/treadmill/weights/elliptical 30 minutes 3 times per week.  Today's visit was #: 36 Starting weight: 234 lbs Starting date: 06/01/2019 Today's weight: 222 lbs Today's date: 09/25/2021 Total lbs lost to date: 12 lbs Total lbs lost since last in-office visit: 0  Interim History: Doris Harris has maintained her weight since her last visit. Going back and fourth between Cat 1 and Cat 2, probably not reaching protein goals.  She likes to eat fruit, vegetables and nuts. Drinking water and herbal tea, denies hunger and cravings.   Subjective:   1. Essential hypertension Solash's blood pressure is elevated today. Taking Benicar, Toprol-XL, HTCZ around 30 mins ago. denies any chest pain,shortness of breath or palpitations. Saw cardiology years ago and plans to call to schedule an appointment.   Assessment/Plan:   1. Essential hypertension Marg needs to make a follow up appointment with cardiology.  Sean is working on healthy weight loss and exercise to improve blood pressure control. We will watch for signs of hypotension as she continues her lifestyle modifications.   2. Obesity: Current BMI 34.8 Doris Harris is currently in the action stage of change. As such, her goal is to continue with weight loss efforts. She has agreed to keeping a food journal and adhering to recommended goals of 1100 calories and 75+ grams of protein.   Exercise goals: As is.   Handout given: Protein contents of food protein substitutes for 2oz meat and high protein foods with lower calories.   Behavioral modification strategies: increasing lean protein intake, increasing water intake, planning for success, and keeping a strict food  journal.  Doris Harris has agreed to follow-up with our clinic in 4 weeks. She was informed of the importance of frequent follow-up visits to maximize her success with intensive lifestyle modifications for her multiple health conditions.   Objective:   Blood pressure (!) 150/84, pulse 67, temperature 98.4 F (36.9 C), height 5\' 7"  (1.702 m), weight 222 lb (100.7 kg), SpO2 95 %. Body mass index is 34.77 kg/m.  General: Cooperative, alert, well developed, in no acute distress. HEENT: Conjunctivae and lids unremarkable. Cardiovascular: Regular rhythm.  Lungs: Normal work of breathing. Neurologic: No focal deficits.   Lab Results  Component Value Date   CREATININE 0.90 06/27/2021   BUN 16 06/27/2021   NA 140 06/27/2021   K 4.5 06/27/2021   CL 102 06/27/2021   CO2 26 06/27/2021   Lab Results  Component Value Date   ALT 26 06/27/2021   AST 25 06/27/2021   ALKPHOS 109 06/27/2021   BILITOT 0.4 06/27/2021   Lab Results  Component Value Date   HGBA1C 5.7 (H) 06/27/2021   HGBA1C 5.6 09/13/2020   HGBA1C 5.5 03/22/2020   HGBA1C 5.5 11/17/2019   HGBA1C 5.6 06/01/2019   Lab Results  Component Value Date   INSULIN 6.2 06/27/2021   INSULIN 5.3 03/22/2020   INSULIN 6.6 11/17/2019   INSULIN 8.7 06/01/2019   Lab Results  Component Value Date   TSH 1.780 09/13/2020   Lab Results  Component Value Date   CHOL 242 (H) 06/27/2021   HDL 88 06/27/2021   LDLCALC 141 (H) 06/27/2021   LDLDIRECT 120.2 09/05/2012   TRIG 79 06/27/2021  CHOLHDL 2.9 09/13/2020   Lab Results  Component Value Date   VD25OH 55.4 06/27/2021   VD25OH 62.7 03/22/2020   VD25OH 39.5 11/17/2019   Lab Results  Component Value Date   WBC 5.9 09/13/2020   HGB 13.6 09/13/2020   HCT 40.3 09/13/2020   MCV 93 09/13/2020   PLT 191 09/13/2020   No results found for: "IRON", "TIBC", "FERRITIN"  Attestation Statements:   Reviewed by clinician on day of visit: allergies, medications, problem list, medical history,  surgical history, family history, social history, and previous encounter notes.  Time spent on visit including pre-visit chart review and post-visit care and charting was 30 minutes.   I, Brendell Tyus, RMA, am acting as transcriptionist for Irene Limbo, FNP..  I have reviewed the above documentation for accuracy and completeness, and I agree with the above. Irene Limbo, FNP

## 2021-10-18 NOTE — Progress Notes (Unsigned)
TeleHealth Visit:  This visit was completed with telemedicine (audio/video) technology. Doris Harris has verbally consented to this TeleHealth visit. The patient is located at home, the provider is located at home. The participants in this visit include the listed provider and patient. The visit was conducted today via MyChart video.  OBESITY Doris Harris is here to discuss her progress with her obesity treatment plan along with follow-up of her obesity related diagnoses.   Today's visit was # 37 Starting weight: 234 lbs Starting date: 06/01/2019 Weight at last in office visit: 222 lbs on 09/25/21 Total weight loss: 12 lbs at last in office visit on 09/25/21. Today's reported weight: 220 lbs   Nutrition Plan: keeping a food journal and adhering to recommended goals of 1100 calories and 75 protein. - has been consistently over the past few weeks.  Current exercise: [employer]/treadmill/weights/elliptical 30 minutes 3 times per week.  Interim History: Doris Harris has been consistent with her journaling over the past few weeks and has lost 2 pounds since her last office visit.  She is exceeding her protein goal at times but averages75 g/day.  Calories are averaging 1100-1200/day. She is consistent with exercise. She recently changed her PCP and she has made some adjustments in her blood pressure medications. She has a family reunion coming up the end of August.  Assessment/Plan:  1. Hypertension Hypertension improved.  Home blood pressures running 130s systolic and 70s to 80s diastolic.  Recently taken off of HCTZ and prescribed furosemide 20 mg daily. Medication(s): Furosemide 20 mg daily, metoprolol succinate 100 mg daily, olmesartan 40 mg daily. Denies chest pain or shortness of breath.  BP Readings from Last 3 Encounters:  09/25/21 (!) 150/84  07/31/21 (!) 178/83  06/27/21 (!) 159/87   Lab Results  Component Value Date   CREATININE 0.90 06/27/2021   CREATININE 0.80 09/13/2020   CREATININE 0.84  03/22/2020    Plan: Follow-up with PCP for management. Continue all medications at current dosages.   2. Obesity: Current BMI 34.8 Doris Harris is currently in the action stage of change. As such, her goal is to continue with weight loss efforts.  She has agreed to keeping a food journal and adhering to recommended goals of 1100 calories and 75 gms protein.   She will try to keep calories closer to 1100/day. Continue consistent journaling.  Exercise goals: as is.  Behavioral modification strategies: increasing lean protein intake, decreasing simple carbohydrates, and keeping a strict food journal.  Doris Harris has agreed to follow-up with our clinic in 4 weeks.   No orders of the defined types were placed in this encounter.   There are no discontinued medications.   No orders of the defined types were placed in this encounter.     Objective:   VITALS: Per patient if applicable, see vitals. GENERAL: Alert and in no acute distress. CARDIOPULMONARY: No increased WOB. Speaking in clear sentences.  PSYCH: Pleasant and cooperative. Speech normal rate and rhythm. Affect is appropriate. Insight and judgement are appropriate. Attention is focused, linear, and appropriate.  NEURO: Oriented as arrived to appointment on time with no prompting.   Lab Results  Component Value Date   CREATININE 0.90 06/27/2021   BUN 16 06/27/2021   NA 140 06/27/2021   K 4.5 06/27/2021   CL 102 06/27/2021   CO2 26 06/27/2021   Lab Results  Component Value Date   ALT 26 06/27/2021   AST 25 06/27/2021   ALKPHOS 109 06/27/2021   BILITOT 0.4 06/27/2021   Lab Results  Component Value Date   HGBA1C 5.7 (H) 06/27/2021   HGBA1C 5.6 09/13/2020   HGBA1C 5.5 03/22/2020   HGBA1C 5.5 11/17/2019   HGBA1C 5.6 06/01/2019   Lab Results  Component Value Date   INSULIN 6.2 06/27/2021   INSULIN 5.3 03/22/2020   INSULIN 6.6 11/17/2019   INSULIN 8.7 06/01/2019   Lab Results  Component Value Date   TSH 1.780  09/13/2020   Lab Results  Component Value Date   CHOL 242 (H) 06/27/2021   HDL 88 06/27/2021   LDLCALC 141 (H) 06/27/2021   LDLDIRECT 120.2 09/05/2012   TRIG 79 06/27/2021   CHOLHDL 2.9 09/13/2020   Lab Results  Component Value Date   WBC 5.9 09/13/2020   HGB 13.6 09/13/2020   HCT 40.3 09/13/2020   MCV 93 09/13/2020   PLT 191 09/13/2020   No results found for: "IRON", "TIBC", "FERRITIN" Lab Results  Component Value Date   VD25OH 55.4 06/27/2021   VD25OH 62.7 03/22/2020   VD25OH 39.5 11/17/2019    Attestation Statements:   Reviewed by clinician on day of visit: allergies, medications, problem list, medical history, surgical history, family history, social history, and previous encounter notes.

## 2021-10-19 ENCOUNTER — Telehealth (INDEPENDENT_AMBULATORY_CARE_PROVIDER_SITE_OTHER): Payer: Medicare Other | Admitting: Family Medicine

## 2021-10-19 ENCOUNTER — Encounter (INDEPENDENT_AMBULATORY_CARE_PROVIDER_SITE_OTHER): Payer: Self-pay | Admitting: Family Medicine

## 2021-10-19 DIAGNOSIS — Z6834 Body mass index (BMI) 34.0-34.9, adult: Secondary | ICD-10-CM | POA: Diagnosis not present

## 2021-10-19 DIAGNOSIS — I10 Essential (primary) hypertension: Secondary | ICD-10-CM

## 2021-10-19 DIAGNOSIS — E669 Obesity, unspecified: Secondary | ICD-10-CM

## 2021-11-15 ENCOUNTER — Encounter (INDEPENDENT_AMBULATORY_CARE_PROVIDER_SITE_OTHER): Payer: Self-pay

## 2021-11-20 ENCOUNTER — Ambulatory Visit (INDEPENDENT_AMBULATORY_CARE_PROVIDER_SITE_OTHER): Payer: Medicare Other | Admitting: Nurse Practitioner

## 2021-12-14 ENCOUNTER — Other Ambulatory Visit: Payer: Self-pay | Admitting: Physician Assistant

## 2021-12-14 DIAGNOSIS — I10 Essential (primary) hypertension: Secondary | ICD-10-CM

## 2021-12-18 ENCOUNTER — Telehealth (INDEPENDENT_AMBULATORY_CARE_PROVIDER_SITE_OTHER): Payer: Medicare Other | Admitting: Family Medicine

## 2022-01-04 ENCOUNTER — Other Ambulatory Visit: Payer: Self-pay | Admitting: Physician Assistant

## 2022-01-04 DIAGNOSIS — I10 Essential (primary) hypertension: Secondary | ICD-10-CM

## 2022-02-04 ENCOUNTER — Other Ambulatory Visit: Payer: Self-pay | Admitting: Physician Assistant

## 2022-02-20 ENCOUNTER — Other Ambulatory Visit: Payer: Self-pay | Admitting: Physician Assistant

## 2022-04-30 ENCOUNTER — Other Ambulatory Visit: Payer: Self-pay

## 2022-09-18 DIAGNOSIS — R5383 Other fatigue: Secondary | ICD-10-CM | POA: Diagnosis not present

## 2022-09-18 DIAGNOSIS — I10 Essential (primary) hypertension: Secondary | ICD-10-CM | POA: Diagnosis not present

## 2022-09-18 DIAGNOSIS — E785 Hyperlipidemia, unspecified: Secondary | ICD-10-CM | POA: Diagnosis not present

## 2022-09-18 DIAGNOSIS — R7303 Prediabetes: Secondary | ICD-10-CM | POA: Diagnosis not present

## 2022-11-02 DIAGNOSIS — Z1152 Encounter for screening for COVID-19: Secondary | ICD-10-CM | POA: Diagnosis not present

## 2022-11-02 DIAGNOSIS — U071 COVID-19: Secondary | ICD-10-CM | POA: Diagnosis not present

## 2022-11-02 DIAGNOSIS — I10 Essential (primary) hypertension: Secondary | ICD-10-CM | POA: Diagnosis not present

## 2022-11-20 DIAGNOSIS — I10 Essential (primary) hypertension: Secondary | ICD-10-CM | POA: Diagnosis not present

## 2022-11-20 DIAGNOSIS — E785 Hyperlipidemia, unspecified: Secondary | ICD-10-CM | POA: Diagnosis not present

## 2022-11-20 DIAGNOSIS — R7303 Prediabetes: Secondary | ICD-10-CM | POA: Diagnosis not present

## 2022-11-20 DIAGNOSIS — R5383 Other fatigue: Secondary | ICD-10-CM | POA: Diagnosis not present

## 2022-12-18 DIAGNOSIS — I10 Essential (primary) hypertension: Secondary | ICD-10-CM | POA: Diagnosis not present

## 2022-12-18 DIAGNOSIS — R7303 Prediabetes: Secondary | ICD-10-CM | POA: Diagnosis not present

## 2023-01-01 DIAGNOSIS — I10 Essential (primary) hypertension: Secondary | ICD-10-CM | POA: Diagnosis not present

## 2023-02-05 DIAGNOSIS — I10 Essential (primary) hypertension: Secondary | ICD-10-CM | POA: Diagnosis not present

## 2023-09-10 DIAGNOSIS — I10 Essential (primary) hypertension: Secondary | ICD-10-CM | POA: Diagnosis not present

## 2023-09-10 DIAGNOSIS — R7303 Prediabetes: Secondary | ICD-10-CM | POA: Diagnosis not present

## 2023-09-10 DIAGNOSIS — E785 Hyperlipidemia, unspecified: Secondary | ICD-10-CM | POA: Diagnosis not present

## 2023-09-11 ENCOUNTER — Ambulatory Visit
Admission: RE | Admit: 2023-09-11 | Discharge: 2023-09-11 | Disposition: A | Discharge status: Home / Self Care | Source: Ambulatory Visit | Attending: Adult Health | Admitting: Adult Health

## 2023-09-11 ENCOUNTER — Other Ambulatory Visit: Payer: Self-pay | Admitting: Adult Health

## 2023-09-11 DIAGNOSIS — S92015A Nondisplaced fracture of body of left calcaneus, initial encounter for closed fracture: Secondary | ICD-10-CM | POA: Diagnosis not present

## 2023-09-11 DIAGNOSIS — M19072 Primary osteoarthritis, left ankle and foot: Secondary | ICD-10-CM | POA: Diagnosis not present

## 2023-09-11 DIAGNOSIS — M79672 Pain in left foot: Secondary | ICD-10-CM

## 2023-09-11 DIAGNOSIS — S92352A Displaced fracture of fifth metatarsal bone, left foot, initial encounter for closed fracture: Secondary | ICD-10-CM | POA: Diagnosis not present

## 2023-09-11 DIAGNOSIS — M2012 Hallux valgus (acquired), left foot: Secondary | ICD-10-CM | POA: Diagnosis not present

## 2023-09-16 DIAGNOSIS — M79672 Pain in left foot: Secondary | ICD-10-CM | POA: Diagnosis not present

## 2023-09-16 DIAGNOSIS — S92215A Nondisplaced fracture of cuboid bone of left foot, initial encounter for closed fracture: Secondary | ICD-10-CM | POA: Diagnosis not present

## 2023-09-16 DIAGNOSIS — S92352A Displaced fracture of fifth metatarsal bone, left foot, initial encounter for closed fracture: Secondary | ICD-10-CM | POA: Diagnosis not present

## 2023-10-16 DIAGNOSIS — S92215D Nondisplaced fracture of cuboid bone of left foot, subsequent encounter for fracture with routine healing: Secondary | ICD-10-CM | POA: Diagnosis not present

## 2023-10-16 DIAGNOSIS — S92352D Displaced fracture of fifth metatarsal bone, left foot, subsequent encounter for fracture with routine healing: Secondary | ICD-10-CM | POA: Diagnosis not present

## 2023-11-15 DIAGNOSIS — S92352D Displaced fracture of fifth metatarsal bone, left foot, subsequent encounter for fracture with routine healing: Secondary | ICD-10-CM | POA: Diagnosis not present

## 2023-11-15 DIAGNOSIS — S92215D Nondisplaced fracture of cuboid bone of left foot, subsequent encounter for fracture with routine healing: Secondary | ICD-10-CM | POA: Diagnosis not present
